# Patient Record
Sex: Female | Born: 1940 | Race: White | Hispanic: No | Smoking: Former smoker
Health system: Southern US, Community
[De-identification: ages and names within clinical notes are randomized; demographics above are authoritative.]

## PROBLEM LIST (undated history)

## (undated) DIAGNOSIS — R9389 Abnormal findings on diagnostic imaging of other specified body structures: Secondary | ICD-10-CM

## (undated) DIAGNOSIS — Z808 Family history of malignant neoplasm of other organs or systems: Secondary | ICD-10-CM

## (undated) DIAGNOSIS — E213 Hyperparathyroidism, unspecified: Secondary | ICD-10-CM

## (undated) DIAGNOSIS — E785 Hyperlipidemia, unspecified: Secondary | ICD-10-CM

## (undated) DIAGNOSIS — M1990 Unspecified osteoarthritis, unspecified site: Secondary | ICD-10-CM

## (undated) DIAGNOSIS — E119 Type 2 diabetes mellitus without complications: Secondary | ICD-10-CM

## (undated) DIAGNOSIS — Z800 Family history of malignant neoplasm of digestive organs: Secondary | ICD-10-CM

## (undated) DIAGNOSIS — I10 Essential (primary) hypertension: Secondary | ICD-10-CM

## (undated) HISTORY — DX: Family history of malignant neoplasm of other organs or systems: Z80.8

## (undated) HISTORY — PX: TUBAL LIGATION: SHX77

## (undated) HISTORY — PX: BREAST BIOPSY: SHX20

## (undated) HISTORY — DX: Family history of malignant neoplasm of digestive organs: Z80.0

## (undated) HISTORY — PX: MENISCUS REPAIR: SHX5179

## (undated) HISTORY — PX: ESOPHAGOGASTRODUODENOSCOPY (EGD) WITH PROPOFOL: SHX5813

## (undated) HISTORY — DX: Unspecified osteoarthritis, unspecified site: M19.90

---

## 1987-08-21 HISTORY — PX: BREAST BIOPSY: SHX20

## 1987-08-21 HISTORY — PX: BREAST EXCISIONAL BIOPSY: SUR124

## 2001-04-30 ENCOUNTER — Encounter: Payer: Self-pay | Admitting: Internal Medicine

## 2001-04-30 ENCOUNTER — Encounter: Admission: RE | Admit: 2001-04-30 | Discharge: 2001-04-30 | Payer: Self-pay | Admitting: Internal Medicine

## 2001-05-12 ENCOUNTER — Other Ambulatory Visit: Admission: RE | Admit: 2001-05-12 | Discharge: 2001-05-12 | Payer: Self-pay | Admitting: Internal Medicine

## 2001-06-05 ENCOUNTER — Ambulatory Visit (HOSPITAL_COMMUNITY): Admission: RE | Admit: 2001-06-05 | Discharge: 2001-06-05 | Payer: Self-pay | Admitting: Gastroenterology

## 2002-06-29 ENCOUNTER — Ambulatory Visit (HOSPITAL_COMMUNITY): Admission: RE | Admit: 2002-06-29 | Discharge: 2002-06-29 | Payer: Self-pay

## 2004-12-21 ENCOUNTER — Encounter: Admission: RE | Admit: 2004-12-21 | Discharge: 2004-12-21 | Payer: Self-pay | Admitting: Internal Medicine

## 2004-12-22 ENCOUNTER — Other Ambulatory Visit: Admission: RE | Admit: 2004-12-22 | Discharge: 2004-12-22 | Payer: Self-pay | Admitting: Internal Medicine

## 2005-02-06 ENCOUNTER — Encounter: Admission: RE | Admit: 2005-02-06 | Discharge: 2005-02-06 | Payer: Self-pay | Admitting: Otolaryngology

## 2005-05-30 ENCOUNTER — Emergency Department (HOSPITAL_COMMUNITY): Admission: EM | Admit: 2005-05-30 | Discharge: 2005-05-30 | Payer: Self-pay | Admitting: Emergency Medicine

## 2006-02-21 ENCOUNTER — Encounter: Admission: RE | Admit: 2006-02-21 | Discharge: 2006-02-21 | Payer: Self-pay | Admitting: Internal Medicine

## 2007-02-24 ENCOUNTER — Encounter: Admission: RE | Admit: 2007-02-24 | Discharge: 2007-02-24 | Payer: Self-pay | Admitting: Internal Medicine

## 2007-12-25 ENCOUNTER — Other Ambulatory Visit: Admission: RE | Admit: 2007-12-25 | Discharge: 2007-12-25 | Payer: Self-pay | Admitting: Obstetrics and Gynecology

## 2008-03-30 ENCOUNTER — Encounter: Admission: RE | Admit: 2008-03-30 | Discharge: 2008-03-30 | Payer: Self-pay | Admitting: Internal Medicine

## 2009-04-04 ENCOUNTER — Encounter: Admission: RE | Admit: 2009-04-04 | Discharge: 2009-04-04 | Payer: Self-pay | Admitting: Internal Medicine

## 2010-05-17 ENCOUNTER — Encounter: Admission: RE | Admit: 2010-05-17 | Discharge: 2010-05-17 | Payer: Self-pay | Admitting: Internal Medicine

## 2011-01-05 NOTE — Procedures (Signed)
[facility]. [facility]  Patient:    Jade Sanchez, Jade Sanchez Visit Number: [id number] MRN: [id number]          Service Type: Attending:  Verlin Grills, M.D. Dictated by:   Verlin Grills, M.D. Proc. Date: 06/05/01                             Procedure Report  DATE OF BIRTH:  1941-05-31  REFERRING PHYSICIAN:  PROCEDURE PERFORMED:  Colonoscopy.  ENDOSCOPIST:  Verlin Grills, M.D.  INDICATIONS FOR PROCEDURE:  The patient is a 70 year old female.  She last underwent a screening colonoscopy three years ago in [place], [place] and a colon polyp was removed.  She is due for a repeat surveillance colonoscopy with polypectomy to prevent colon cancer.  Her father developed colon cancer at age 11.  Her mother developed colon cancer when she was in her 79s.  PREMEDICATION:  Versed 10 mg, fentanyl 50 mcg.  ENDOSCOPE:  Olympus video colonoscope.  DESCRIPTION OF PROCEDURE:  After obtaining informed consent, the patient was placed in the left lateral decubitus position.  I administered intravenous fentanyl and intravenous Versed to achieve conscious sedation for the procedure.  The patients blood pressure, oxygen saturation and cardiac rhythm were monitored throughout the procedure and documented in the medical record.  Anal inspection was normal.  Digital rectal exam was normal.  The Olympus pediatric video colonoscope was then introduced into the rectum and slowly advanced to the cecum.  The procedure was started with the patient in the left lateral decubitus position; she was then rolled to the supine position and finally in the right lateral decubitus position.  External abdominal pressure was required in order to intubate the cecum.  A normal-appearing ileocecal valve was identified and the distal ileum was inspected.  Colonic preparation for the exam today was excellent.  Rectum:  Normal.  Sigmoid colon and descending colon:   Extensive left colonic diverticulosis.  Splenic flexure:  Normal.  Transverse colon:  Normal.  Hepatic flexure:  Normal.  Ascending colon:  Normal.  Cecum and ileocecal valve:  Normal.  Distal ileum:  Normal.  ASSESSMENT:  Extensive left colonic diverticulosis.  No endoscopic evidence for the presence of colorectal neoplasia.  RECOMMENDATIONS:  Repeat colonoscopy in approximately five years. Dictated by:   Verlin Grills, M.D. Attending:  Verlin Grills, M.D. DD:  06/05/01 TD:  06/05/01 Job: [id number]

## 2011-07-24 ENCOUNTER — Other Ambulatory Visit: Payer: Self-pay | Admitting: Family Medicine

## 2011-07-24 DIAGNOSIS — Z1231 Encounter for screening mammogram for malignant neoplasm of breast: Secondary | ICD-10-CM

## 2011-08-28 ENCOUNTER — Ambulatory Visit
Admission: RE | Admit: 2011-08-28 | Discharge: 2011-08-28 | Disposition: A | Discharge status: Home / Self Care | Payer: Medicare Other | Source: Ambulatory Visit | Attending: Family Medicine | Admitting: Family Medicine

## 2011-08-28 DIAGNOSIS — Z1231 Encounter for screening mammogram for malignant neoplasm of breast: Secondary | ICD-10-CM

## 2011-10-23 DIAGNOSIS — E119 Type 2 diabetes mellitus without complications: Secondary | ICD-10-CM | POA: Diagnosis not present

## 2011-10-23 DIAGNOSIS — I10 Essential (primary) hypertension: Secondary | ICD-10-CM | POA: Diagnosis not present

## 2011-12-11 DIAGNOSIS — Z800 Family history of malignant neoplasm of digestive organs: Secondary | ICD-10-CM | POA: Diagnosis not present

## 2011-12-11 DIAGNOSIS — Z86010 Personal history of colonic polyps: Secondary | ICD-10-CM | POA: Diagnosis not present

## 2011-12-11 DIAGNOSIS — Z09 Encounter for follow-up examination after completed treatment for conditions other than malignant neoplasm: Secondary | ICD-10-CM | POA: Diagnosis not present

## 2011-12-11 DIAGNOSIS — K5730 Diverticulosis of large intestine without perforation or abscess without bleeding: Secondary | ICD-10-CM | POA: Diagnosis not present

## 2012-01-21 DIAGNOSIS — D3130 Benign neoplasm of unspecified choroid: Secondary | ICD-10-CM | POA: Diagnosis not present

## 2012-01-21 DIAGNOSIS — H40019 Open angle with borderline findings, low risk, unspecified eye: Secondary | ICD-10-CM | POA: Diagnosis not present

## 2012-01-21 DIAGNOSIS — H35039 Hypertensive retinopathy, unspecified eye: Secondary | ICD-10-CM | POA: Diagnosis not present

## 2012-01-21 DIAGNOSIS — H2510 Age-related nuclear cataract, unspecified eye: Secondary | ICD-10-CM | POA: Diagnosis not present

## 2012-01-21 DIAGNOSIS — H43819 Vitreous degeneration, unspecified eye: Secondary | ICD-10-CM | POA: Diagnosis not present

## 2012-01-21 DIAGNOSIS — H35379 Puckering of macula, unspecified eye: Secondary | ICD-10-CM | POA: Diagnosis not present

## 2012-06-04 DIAGNOSIS — E119 Type 2 diabetes mellitus without complications: Secondary | ICD-10-CM | POA: Diagnosis not present

## 2012-06-04 DIAGNOSIS — Z23 Encounter for immunization: Secondary | ICD-10-CM | POA: Diagnosis not present

## 2012-06-04 DIAGNOSIS — E785 Hyperlipidemia, unspecified: Secondary | ICD-10-CM | POA: Diagnosis not present

## 2012-06-04 DIAGNOSIS — Z1331 Encounter for screening for depression: Secondary | ICD-10-CM | POA: Diagnosis not present

## 2012-06-04 DIAGNOSIS — I10 Essential (primary) hypertension: Secondary | ICD-10-CM | POA: Diagnosis not present

## 2012-06-12 DIAGNOSIS — E559 Vitamin D deficiency, unspecified: Secondary | ICD-10-CM | POA: Diagnosis not present

## 2012-06-24 DIAGNOSIS — D485 Neoplasm of uncertain behavior of skin: Secondary | ICD-10-CM | POA: Diagnosis not present

## 2012-07-29 DIAGNOSIS — D485 Neoplasm of uncertain behavior of skin: Secondary | ICD-10-CM | POA: Diagnosis not present

## 2012-07-29 DIAGNOSIS — D235 Other benign neoplasm of skin of trunk: Secondary | ICD-10-CM | POA: Diagnosis not present

## 2012-08-07 ENCOUNTER — Other Ambulatory Visit: Payer: Self-pay | Admitting: Internal Medicine

## 2012-08-07 DIAGNOSIS — Z1231 Encounter for screening mammogram for malignant neoplasm of breast: Secondary | ICD-10-CM

## 2012-09-09 ENCOUNTER — Ambulatory Visit
Admission: RE | Admit: 2012-09-09 | Discharge: 2012-09-09 | Disposition: A | Discharge status: Home / Self Care | Payer: Medicare Other | Source: Ambulatory Visit | Attending: Internal Medicine | Admitting: Internal Medicine

## 2012-09-09 DIAGNOSIS — Z1231 Encounter for screening mammogram for malignant neoplasm of breast: Secondary | ICD-10-CM

## 2013-01-21 DIAGNOSIS — E119 Type 2 diabetes mellitus without complications: Secondary | ICD-10-CM | POA: Diagnosis not present

## 2013-01-21 DIAGNOSIS — H40019 Open angle with borderline findings, low risk, unspecified eye: Secondary | ICD-10-CM | POA: Diagnosis not present

## 2013-01-21 DIAGNOSIS — H43819 Vitreous degeneration, unspecified eye: Secondary | ICD-10-CM | POA: Diagnosis not present

## 2013-01-21 DIAGNOSIS — H2510 Age-related nuclear cataract, unspecified eye: Secondary | ICD-10-CM | POA: Diagnosis not present

## 2013-01-21 DIAGNOSIS — H35039 Hypertensive retinopathy, unspecified eye: Secondary | ICD-10-CM | POA: Diagnosis not present

## 2013-01-21 DIAGNOSIS — H35379 Puckering of macula, unspecified eye: Secondary | ICD-10-CM | POA: Diagnosis not present

## 2013-01-21 DIAGNOSIS — D3130 Benign neoplasm of unspecified choroid: Secondary | ICD-10-CM | POA: Diagnosis not present

## 2013-02-11 DIAGNOSIS — H2510 Age-related nuclear cataract, unspecified eye: Secondary | ICD-10-CM | POA: Diagnosis not present

## 2013-03-03 DIAGNOSIS — H2510 Age-related nuclear cataract, unspecified eye: Secondary | ICD-10-CM | POA: Diagnosis not present

## 2013-03-03 DIAGNOSIS — H269 Unspecified cataract: Secondary | ICD-10-CM | POA: Diagnosis not present

## 2013-04-15 ENCOUNTER — Encounter (INDEPENDENT_AMBULATORY_CARE_PROVIDER_SITE_OTHER): Payer: Medicare Other | Admitting: Ophthalmology

## 2013-04-15 DIAGNOSIS — H35379 Puckering of macula, unspecified eye: Secondary | ICD-10-CM

## 2013-04-15 DIAGNOSIS — I10 Essential (primary) hypertension: Secondary | ICD-10-CM

## 2013-04-15 DIAGNOSIS — H43819 Vitreous degeneration, unspecified eye: Secondary | ICD-10-CM

## 2013-04-15 DIAGNOSIS — H35039 Hypertensive retinopathy, unspecified eye: Secondary | ICD-10-CM

## 2013-04-15 DIAGNOSIS — H2510 Age-related nuclear cataract, unspecified eye: Secondary | ICD-10-CM

## 2013-04-15 DIAGNOSIS — E11319 Type 2 diabetes mellitus with unspecified diabetic retinopathy without macular edema: Secondary | ICD-10-CM | POA: Diagnosis not present

## 2013-04-15 DIAGNOSIS — E1139 Type 2 diabetes mellitus with other diabetic ophthalmic complication: Secondary | ICD-10-CM

## 2013-06-29 DIAGNOSIS — H40019 Open angle with borderline findings, low risk, unspecified eye: Secondary | ICD-10-CM | POA: Diagnosis not present

## 2013-07-30 DIAGNOSIS — D485 Neoplasm of uncertain behavior of skin: Secondary | ICD-10-CM | POA: Diagnosis not present

## 2013-07-30 DIAGNOSIS — D1801 Hemangioma of skin and subcutaneous tissue: Secondary | ICD-10-CM | POA: Diagnosis not present

## 2013-07-30 DIAGNOSIS — D235 Other benign neoplasm of skin of trunk: Secondary | ICD-10-CM | POA: Diagnosis not present

## 2013-07-30 DIAGNOSIS — L819 Disorder of pigmentation, unspecified: Secondary | ICD-10-CM | POA: Diagnosis not present

## 2013-07-30 DIAGNOSIS — L821 Other seborrheic keratosis: Secondary | ICD-10-CM | POA: Diagnosis not present

## 2013-08-27 DIAGNOSIS — E119 Type 2 diabetes mellitus without complications: Secondary | ICD-10-CM | POA: Diagnosis not present

## 2013-08-27 DIAGNOSIS — E11319 Type 2 diabetes mellitus with unspecified diabetic retinopathy without macular edema: Secondary | ICD-10-CM | POA: Diagnosis not present

## 2013-08-27 DIAGNOSIS — H2510 Age-related nuclear cataract, unspecified eye: Secondary | ICD-10-CM | POA: Diagnosis not present

## 2013-08-27 DIAGNOSIS — H40019 Open angle with borderline findings, low risk, unspecified eye: Secondary | ICD-10-CM | POA: Diagnosis not present

## 2013-09-14 DIAGNOSIS — H26499 Other secondary cataract, unspecified eye: Secondary | ICD-10-CM | POA: Diagnosis not present

## 2013-10-19 ENCOUNTER — Ambulatory Visit (INDEPENDENT_AMBULATORY_CARE_PROVIDER_SITE_OTHER): Payer: Medicare Other | Admitting: Ophthalmology

## 2013-10-19 DIAGNOSIS — H35379 Puckering of macula, unspecified eye: Secondary | ICD-10-CM | POA: Diagnosis not present

## 2013-10-19 DIAGNOSIS — I10 Essential (primary) hypertension: Secondary | ICD-10-CM | POA: Diagnosis not present

## 2013-10-19 DIAGNOSIS — E1139 Type 2 diabetes mellitus with other diabetic ophthalmic complication: Secondary | ICD-10-CM

## 2013-10-19 DIAGNOSIS — E1165 Type 2 diabetes mellitus with hyperglycemia: Secondary | ICD-10-CM | POA: Diagnosis not present

## 2013-10-19 DIAGNOSIS — H2510 Age-related nuclear cataract, unspecified eye: Secondary | ICD-10-CM

## 2013-10-19 DIAGNOSIS — E11319 Type 2 diabetes mellitus with unspecified diabetic retinopathy without macular edema: Secondary | ICD-10-CM

## 2013-10-19 DIAGNOSIS — H43819 Vitreous degeneration, unspecified eye: Secondary | ICD-10-CM

## 2013-10-19 DIAGNOSIS — H35039 Hypertensive retinopathy, unspecified eye: Secondary | ICD-10-CM

## 2014-01-22 ENCOUNTER — Other Ambulatory Visit: Payer: Self-pay

## 2014-01-22 DIAGNOSIS — Z1231 Encounter for screening mammogram for malignant neoplasm of breast: Secondary | ICD-10-CM

## 2014-02-02 ENCOUNTER — Encounter (INDEPENDENT_AMBULATORY_CARE_PROVIDER_SITE_OTHER): Payer: Self-pay

## 2014-02-02 ENCOUNTER — Ambulatory Visit
Admission: RE | Admit: 2014-02-02 | Discharge: 2014-02-02 | Disposition: A | Discharge status: Home / Self Care | Payer: Medicare Other | Source: Ambulatory Visit

## 2014-02-02 DIAGNOSIS — Z1231 Encounter for screening mammogram for malignant neoplasm of breast: Secondary | ICD-10-CM

## 2014-02-03 ENCOUNTER — Other Ambulatory Visit: Payer: Self-pay | Admitting: Internal Medicine

## 2014-02-03 DIAGNOSIS — R928 Other abnormal and inconclusive findings on diagnostic imaging of breast: Secondary | ICD-10-CM

## 2014-02-09 ENCOUNTER — Ambulatory Visit
Admission: RE | Admit: 2014-02-09 | Discharge: 2014-02-09 | Disposition: A | Discharge status: Home / Self Care | Payer: Medicare Other | Source: Ambulatory Visit | Attending: Internal Medicine | Admitting: Internal Medicine

## 2014-02-09 ENCOUNTER — Other Ambulatory Visit: Payer: Self-pay | Admitting: Internal Medicine

## 2014-02-09 DIAGNOSIS — N6489 Other specified disorders of breast: Secondary | ICD-10-CM | POA: Diagnosis not present

## 2014-02-09 DIAGNOSIS — N6310 Unspecified lump in the right breast, unspecified quadrant: Secondary | ICD-10-CM

## 2014-02-09 DIAGNOSIS — R928 Other abnormal and inconclusive findings on diagnostic imaging of breast: Secondary | ICD-10-CM

## 2014-02-09 DIAGNOSIS — N6320 Unspecified lump in the left breast, unspecified quadrant: Secondary | ICD-10-CM

## 2014-02-15 ENCOUNTER — Other Ambulatory Visit: Payer: Self-pay | Admitting: Internal Medicine

## 2014-02-15 DIAGNOSIS — N6310 Unspecified lump in the right breast, unspecified quadrant: Secondary | ICD-10-CM

## 2014-02-16 ENCOUNTER — Ambulatory Visit
Admission: RE | Admit: 2014-02-16 | Discharge: 2014-02-16 | Disposition: A | Discharge status: Home / Self Care | Payer: Medicare Other | Source: Ambulatory Visit | Attending: Internal Medicine | Admitting: Internal Medicine

## 2014-02-16 DIAGNOSIS — N6310 Unspecified lump in the right breast, unspecified quadrant: Secondary | ICD-10-CM

## 2014-02-16 DIAGNOSIS — D360 Benign neoplasm of lymph nodes: Secondary | ICD-10-CM | POA: Diagnosis not present

## 2014-02-16 DIAGNOSIS — N6320 Unspecified lump in the left breast, unspecified quadrant: Secondary | ICD-10-CM

## 2014-02-16 DIAGNOSIS — N63 Unspecified lump in unspecified breast: Secondary | ICD-10-CM | POA: Diagnosis not present

## 2014-02-18 DIAGNOSIS — E1129 Type 2 diabetes mellitus with other diabetic kidney complication: Secondary | ICD-10-CM | POA: Diagnosis not present

## 2014-02-18 DIAGNOSIS — E11319 Type 2 diabetes mellitus with unspecified diabetic retinopathy without macular edema: Secondary | ICD-10-CM | POA: Diagnosis not present

## 2014-02-18 DIAGNOSIS — E785 Hyperlipidemia, unspecified: Secondary | ICD-10-CM | POA: Diagnosis not present

## 2014-02-18 DIAGNOSIS — Z0000 Encounter for general adult medical examination without abnormal findings: Secondary | ICD-10-CM | POA: Diagnosis not present

## 2014-02-18 DIAGNOSIS — Z23 Encounter for immunization: Secondary | ICD-10-CM | POA: Diagnosis not present

## 2014-02-18 DIAGNOSIS — N183 Chronic kidney disease, stage 3 unspecified: Secondary | ICD-10-CM | POA: Diagnosis not present

## 2014-02-18 DIAGNOSIS — E1139 Type 2 diabetes mellitus with other diabetic ophthalmic complication: Secondary | ICD-10-CM | POA: Diagnosis not present

## 2014-02-18 DIAGNOSIS — E210 Primary hyperparathyroidism: Secondary | ICD-10-CM | POA: Diagnosis not present

## 2014-02-18 DIAGNOSIS — Z1331 Encounter for screening for depression: Secondary | ICD-10-CM | POA: Diagnosis not present

## 2014-02-18 DIAGNOSIS — R599 Enlarged lymph nodes, unspecified: Secondary | ICD-10-CM | POA: Diagnosis not present

## 2014-02-18 DIAGNOSIS — I10 Essential (primary) hypertension: Secondary | ICD-10-CM | POA: Diagnosis not present

## 2014-03-15 DIAGNOSIS — H40019 Open angle with borderline findings, low risk, unspecified eye: Secondary | ICD-10-CM | POA: Diagnosis not present

## 2014-03-15 DIAGNOSIS — H2510 Age-related nuclear cataract, unspecified eye: Secondary | ICD-10-CM | POA: Diagnosis not present

## 2014-03-15 DIAGNOSIS — H25019 Cortical age-related cataract, unspecified eye: Secondary | ICD-10-CM | POA: Diagnosis not present

## 2014-03-15 DIAGNOSIS — H35039 Hypertensive retinopathy, unspecified eye: Secondary | ICD-10-CM | POA: Diagnosis not present

## 2014-03-25 DIAGNOSIS — E213 Hyperparathyroidism, unspecified: Secondary | ICD-10-CM | POA: Diagnosis not present

## 2014-03-25 DIAGNOSIS — M810 Age-related osteoporosis without current pathological fracture: Secondary | ICD-10-CM | POA: Diagnosis not present

## 2014-04-13 DIAGNOSIS — H2510 Age-related nuclear cataract, unspecified eye: Secondary | ICD-10-CM | POA: Diagnosis not present

## 2014-04-22 ENCOUNTER — Ambulatory Visit (INDEPENDENT_AMBULATORY_CARE_PROVIDER_SITE_OTHER): Payer: Medicare Other | Admitting: Ophthalmology

## 2014-04-22 DIAGNOSIS — I10 Essential (primary) hypertension: Secondary | ICD-10-CM

## 2014-04-22 DIAGNOSIS — E1165 Type 2 diabetes mellitus with hyperglycemia: Secondary | ICD-10-CM | POA: Diagnosis not present

## 2014-04-22 DIAGNOSIS — E1139 Type 2 diabetes mellitus with other diabetic ophthalmic complication: Secondary | ICD-10-CM | POA: Diagnosis not present

## 2014-04-22 DIAGNOSIS — D3130 Benign neoplasm of unspecified choroid: Secondary | ICD-10-CM

## 2014-04-22 DIAGNOSIS — H35379 Puckering of macula, unspecified eye: Secondary | ICD-10-CM

## 2014-04-22 DIAGNOSIS — E11319 Type 2 diabetes mellitus with unspecified diabetic retinopathy without macular edema: Secondary | ICD-10-CM | POA: Diagnosis not present

## 2014-04-22 DIAGNOSIS — H35039 Hypertensive retinopathy, unspecified eye: Secondary | ICD-10-CM

## 2014-05-06 ENCOUNTER — Other Ambulatory Visit: Payer: Self-pay | Admitting: Internal Medicine

## 2014-05-06 DIAGNOSIS — N63 Unspecified lump in unspecified breast: Secondary | ICD-10-CM

## 2014-05-18 ENCOUNTER — Ambulatory Visit
Admission: RE | Admit: 2014-05-18 | Discharge: 2014-05-18 | Disposition: A | Discharge status: Home / Self Care | Payer: Medicare Other | Source: Ambulatory Visit | Attending: Internal Medicine | Admitting: Internal Medicine

## 2014-05-18 DIAGNOSIS — N63 Unspecified lump in unspecified breast: Secondary | ICD-10-CM

## 2014-05-18 DIAGNOSIS — N6489 Other specified disorders of breast: Secondary | ICD-10-CM | POA: Diagnosis not present

## 2014-05-26 DIAGNOSIS — E210 Primary hyperparathyroidism: Secondary | ICD-10-CM | POA: Diagnosis not present

## 2014-05-26 DIAGNOSIS — N183 Chronic kidney disease, stage 3 (moderate): Secondary | ICD-10-CM | POA: Diagnosis not present

## 2014-05-26 DIAGNOSIS — I10 Essential (primary) hypertension: Secondary | ICD-10-CM | POA: Diagnosis not present

## 2014-05-26 DIAGNOSIS — Z23 Encounter for immunization: Secondary | ICD-10-CM | POA: Diagnosis not present

## 2014-05-26 DIAGNOSIS — E11319 Type 2 diabetes mellitus with unspecified diabetic retinopathy without macular edema: Secondary | ICD-10-CM | POA: Diagnosis not present

## 2014-05-26 DIAGNOSIS — E1122 Type 2 diabetes mellitus with diabetic chronic kidney disease: Secondary | ICD-10-CM | POA: Diagnosis not present

## 2014-05-26 DIAGNOSIS — M810 Age-related osteoporosis without current pathological fracture: Secondary | ICD-10-CM | POA: Diagnosis not present

## 2014-05-26 DIAGNOSIS — E1139 Type 2 diabetes mellitus with other diabetic ophthalmic complication: Secondary | ICD-10-CM | POA: Diagnosis not present

## 2014-05-26 DIAGNOSIS — L509 Urticaria, unspecified: Secondary | ICD-10-CM | POA: Diagnosis not present

## 2014-07-30 DIAGNOSIS — L814 Other melanin hyperpigmentation: Secondary | ICD-10-CM | POA: Diagnosis not present

## 2014-07-30 DIAGNOSIS — D225 Melanocytic nevi of trunk: Secondary | ICD-10-CM | POA: Diagnosis not present

## 2014-07-30 DIAGNOSIS — L821 Other seborrheic keratosis: Secondary | ICD-10-CM | POA: Diagnosis not present

## 2014-07-30 DIAGNOSIS — D485 Neoplasm of uncertain behavior of skin: Secondary | ICD-10-CM | POA: Diagnosis not present

## 2014-08-10 DIAGNOSIS — H40013 Open angle with borderline findings, low risk, bilateral: Secondary | ICD-10-CM | POA: Diagnosis not present

## 2014-09-09 DIAGNOSIS — E210 Primary hyperparathyroidism: Secondary | ICD-10-CM | POA: Diagnosis not present

## 2014-09-09 DIAGNOSIS — M810 Age-related osteoporosis without current pathological fracture: Secondary | ICD-10-CM | POA: Diagnosis not present

## 2014-09-28 DIAGNOSIS — M810 Age-related osteoporosis without current pathological fracture: Secondary | ICD-10-CM | POA: Diagnosis not present

## 2014-09-28 DIAGNOSIS — E210 Primary hyperparathyroidism: Secondary | ICD-10-CM | POA: Diagnosis not present

## 2014-10-06 DIAGNOSIS — M810 Age-related osteoporosis without current pathological fracture: Secondary | ICD-10-CM | POA: Diagnosis not present

## 2014-10-06 DIAGNOSIS — E1122 Type 2 diabetes mellitus with diabetic chronic kidney disease: Secondary | ICD-10-CM | POA: Diagnosis not present

## 2014-10-06 DIAGNOSIS — I10 Essential (primary) hypertension: Secondary | ICD-10-CM | POA: Diagnosis not present

## 2014-11-04 DIAGNOSIS — Z961 Presence of intraocular lens: Secondary | ICD-10-CM | POA: Diagnosis not present

## 2014-11-04 DIAGNOSIS — H26491 Other secondary cataract, right eye: Secondary | ICD-10-CM | POA: Diagnosis not present

## 2015-01-04 DIAGNOSIS — E1122 Type 2 diabetes mellitus with diabetic chronic kidney disease: Secondary | ICD-10-CM | POA: Diagnosis not present

## 2015-01-04 DIAGNOSIS — E11319 Type 2 diabetes mellitus with unspecified diabetic retinopathy without macular edema: Secondary | ICD-10-CM | POA: Diagnosis not present

## 2015-01-04 DIAGNOSIS — E1139 Type 2 diabetes mellitus with other diabetic ophthalmic complication: Secondary | ICD-10-CM | POA: Diagnosis not present

## 2015-01-04 DIAGNOSIS — E780 Pure hypercholesterolemia: Secondary | ICD-10-CM | POA: Diagnosis not present

## 2015-01-04 DIAGNOSIS — N183 Chronic kidney disease, stage 3 (moderate): Secondary | ICD-10-CM | POA: Diagnosis not present

## 2015-01-04 DIAGNOSIS — I129 Hypertensive chronic kidney disease with stage 1 through stage 4 chronic kidney disease, or unspecified chronic kidney disease: Secondary | ICD-10-CM | POA: Diagnosis not present

## 2015-01-04 DIAGNOSIS — M810 Age-related osteoporosis without current pathological fracture: Secondary | ICD-10-CM | POA: Diagnosis not present

## 2015-01-04 DIAGNOSIS — E210 Primary hyperparathyroidism: Secondary | ICD-10-CM | POA: Diagnosis not present

## 2015-01-04 DIAGNOSIS — Z6841 Body Mass Index (BMI) 40.0 and over, adult: Secondary | ICD-10-CM | POA: Diagnosis not present

## 2015-01-04 DIAGNOSIS — E1165 Type 2 diabetes mellitus with hyperglycemia: Secondary | ICD-10-CM | POA: Diagnosis not present

## 2015-02-02 ENCOUNTER — Other Ambulatory Visit: Payer: Self-pay | Admitting: Internal Medicine

## 2015-02-02 DIAGNOSIS — Z1231 Encounter for screening mammogram for malignant neoplasm of breast: Secondary | ICD-10-CM

## 2015-03-22 ENCOUNTER — Ambulatory Visit
Admission: RE | Admit: 2015-03-22 | Discharge: 2015-03-22 | Disposition: A | Discharge status: Home / Self Care | Payer: Medicare Other | Source: Ambulatory Visit | Attending: Internal Medicine | Admitting: Internal Medicine

## 2015-03-22 DIAGNOSIS — Z1231 Encounter for screening mammogram for malignant neoplasm of breast: Secondary | ICD-10-CM | POA: Diagnosis not present

## 2015-04-12 DIAGNOSIS — I10 Essential (primary) hypertension: Secondary | ICD-10-CM | POA: Diagnosis not present

## 2015-04-12 DIAGNOSIS — M810 Age-related osteoporosis without current pathological fracture: Secondary | ICD-10-CM | POA: Diagnosis not present

## 2015-04-28 ENCOUNTER — Ambulatory Visit (INDEPENDENT_AMBULATORY_CARE_PROVIDER_SITE_OTHER): Payer: Medicare Other | Admitting: Ophthalmology

## 2015-04-28 DIAGNOSIS — E11319 Type 2 diabetes mellitus with unspecified diabetic retinopathy without macular edema: Secondary | ICD-10-CM | POA: Diagnosis not present

## 2015-04-28 DIAGNOSIS — H35033 Hypertensive retinopathy, bilateral: Secondary | ICD-10-CM | POA: Diagnosis not present

## 2015-04-28 DIAGNOSIS — H43813 Vitreous degeneration, bilateral: Secondary | ICD-10-CM

## 2015-04-28 DIAGNOSIS — D3131 Benign neoplasm of right choroid: Secondary | ICD-10-CM | POA: Diagnosis not present

## 2015-04-28 DIAGNOSIS — E11329 Type 2 diabetes mellitus with mild nonproliferative diabetic retinopathy without macular edema: Secondary | ICD-10-CM | POA: Diagnosis not present

## 2015-04-28 DIAGNOSIS — I10 Essential (primary) hypertension: Secondary | ICD-10-CM

## 2015-07-08 DIAGNOSIS — Z6838 Body mass index (BMI) 38.0-38.9, adult: Secondary | ICD-10-CM | POA: Diagnosis not present

## 2015-07-08 DIAGNOSIS — E11319 Type 2 diabetes mellitus with unspecified diabetic retinopathy without macular edema: Secondary | ICD-10-CM | POA: Diagnosis not present

## 2015-07-08 DIAGNOSIS — E1139 Type 2 diabetes mellitus with other diabetic ophthalmic complication: Secondary | ICD-10-CM | POA: Diagnosis not present

## 2015-07-08 DIAGNOSIS — N183 Chronic kidney disease, stage 3 (moderate): Secondary | ICD-10-CM | POA: Diagnosis not present

## 2015-07-08 DIAGNOSIS — I129 Hypertensive chronic kidney disease with stage 1 through stage 4 chronic kidney disease, or unspecified chronic kidney disease: Secondary | ICD-10-CM | POA: Diagnosis not present

## 2015-07-08 DIAGNOSIS — E1122 Type 2 diabetes mellitus with diabetic chronic kidney disease: Secondary | ICD-10-CM | POA: Diagnosis not present

## 2015-07-08 DIAGNOSIS — Z23 Encounter for immunization: Secondary | ICD-10-CM | POA: Diagnosis not present

## 2015-08-02 DIAGNOSIS — H40013 Open angle with borderline findings, low risk, bilateral: Secondary | ICD-10-CM | POA: Diagnosis not present

## 2015-08-23 DIAGNOSIS — D1801 Hemangioma of skin and subcutaneous tissue: Secondary | ICD-10-CM | POA: Diagnosis not present

## 2015-08-23 DIAGNOSIS — L820 Inflamed seborrheic keratosis: Secondary | ICD-10-CM | POA: Diagnosis not present

## 2015-08-23 DIAGNOSIS — L812 Freckles: Secondary | ICD-10-CM | POA: Diagnosis not present

## 2015-08-23 DIAGNOSIS — D225 Melanocytic nevi of trunk: Secondary | ICD-10-CM | POA: Diagnosis not present

## 2015-10-13 DIAGNOSIS — I10 Essential (primary) hypertension: Secondary | ICD-10-CM | POA: Diagnosis not present

## 2015-10-13 DIAGNOSIS — M810 Age-related osteoporosis without current pathological fracture: Secondary | ICD-10-CM | POA: Diagnosis not present

## 2015-10-14 DIAGNOSIS — L723 Sebaceous cyst: Secondary | ICD-10-CM | POA: Diagnosis not present

## 2015-10-14 DIAGNOSIS — H9201 Otalgia, right ear: Secondary | ICD-10-CM | POA: Diagnosis not present

## 2015-11-08 DIAGNOSIS — H35033 Hypertensive retinopathy, bilateral: Secondary | ICD-10-CM | POA: Diagnosis not present

## 2015-11-08 DIAGNOSIS — E119 Type 2 diabetes mellitus without complications: Secondary | ICD-10-CM | POA: Diagnosis not present

## 2015-11-08 DIAGNOSIS — Z961 Presence of intraocular lens: Secondary | ICD-10-CM | POA: Diagnosis not present

## 2015-11-08 DIAGNOSIS — H40013 Open angle with borderline findings, low risk, bilateral: Secondary | ICD-10-CM | POA: Diagnosis not present

## 2016-02-09 DIAGNOSIS — E7800 Pure hypercholesterolemia, unspecified: Secondary | ICD-10-CM | POA: Diagnosis not present

## 2016-02-09 DIAGNOSIS — Z6839 Body mass index (BMI) 39.0-39.9, adult: Secondary | ICD-10-CM | POA: Diagnosis not present

## 2016-02-09 DIAGNOSIS — E1122 Type 2 diabetes mellitus with diabetic chronic kidney disease: Secondary | ICD-10-CM | POA: Diagnosis not present

## 2016-02-09 DIAGNOSIS — E210 Primary hyperparathyroidism: Secondary | ICD-10-CM | POA: Diagnosis not present

## 2016-02-09 DIAGNOSIS — N183 Chronic kidney disease, stage 3 (moderate): Secondary | ICD-10-CM | POA: Diagnosis not present

## 2016-02-09 DIAGNOSIS — I129 Hypertensive chronic kidney disease with stage 1 through stage 4 chronic kidney disease, or unspecified chronic kidney disease: Secondary | ICD-10-CM | POA: Diagnosis not present

## 2016-04-02 ENCOUNTER — Other Ambulatory Visit: Payer: Self-pay | Admitting: Internal Medicine

## 2016-04-02 DIAGNOSIS — Z1231 Encounter for screening mammogram for malignant neoplasm of breast: Secondary | ICD-10-CM

## 2016-04-11 ENCOUNTER — Ambulatory Visit
Admission: RE | Admit: 2016-04-11 | Discharge: 2016-04-11 | Disposition: A | Discharge status: Home / Self Care | Payer: Medicare Other | Source: Ambulatory Visit | Attending: Internal Medicine | Admitting: Internal Medicine

## 2016-04-11 DIAGNOSIS — Z1231 Encounter for screening mammogram for malignant neoplasm of breast: Secondary | ICD-10-CM | POA: Diagnosis not present

## 2016-04-11 DIAGNOSIS — M810 Age-related osteoporosis without current pathological fracture: Secondary | ICD-10-CM | POA: Diagnosis not present

## 2016-04-27 DIAGNOSIS — M810 Age-related osteoporosis without current pathological fracture: Secondary | ICD-10-CM | POA: Diagnosis not present

## 2016-04-27 DIAGNOSIS — I10 Essential (primary) hypertension: Secondary | ICD-10-CM | POA: Diagnosis not present

## 2016-04-30 ENCOUNTER — Ambulatory Visit (INDEPENDENT_AMBULATORY_CARE_PROVIDER_SITE_OTHER): Payer: Medicare Other | Admitting: Ophthalmology

## 2016-04-30 DIAGNOSIS — I10 Essential (primary) hypertension: Secondary | ICD-10-CM

## 2016-04-30 DIAGNOSIS — H35033 Hypertensive retinopathy, bilateral: Secondary | ICD-10-CM | POA: Diagnosis not present

## 2016-04-30 DIAGNOSIS — E113212 Type 2 diabetes mellitus with mild nonproliferative diabetic retinopathy with macular edema, left eye: Secondary | ICD-10-CM | POA: Diagnosis not present

## 2016-04-30 DIAGNOSIS — D3131 Benign neoplasm of right choroid: Secondary | ICD-10-CM | POA: Diagnosis not present

## 2016-04-30 DIAGNOSIS — H43813 Vitreous degeneration, bilateral: Secondary | ICD-10-CM | POA: Diagnosis not present

## 2016-04-30 DIAGNOSIS — E113291 Type 2 diabetes mellitus with mild nonproliferative diabetic retinopathy without macular edema, right eye: Secondary | ICD-10-CM | POA: Diagnosis not present

## 2016-04-30 DIAGNOSIS — E11311 Type 2 diabetes mellitus with unspecified diabetic retinopathy with macular edema: Secondary | ICD-10-CM | POA: Diagnosis not present

## 2016-06-06 DIAGNOSIS — Z23 Encounter for immunization: Secondary | ICD-10-CM | POA: Diagnosis not present

## 2016-06-28 DIAGNOSIS — H40013 Open angle with borderline findings, low risk, bilateral: Secondary | ICD-10-CM | POA: Diagnosis not present

## 2016-11-27 DIAGNOSIS — N183 Chronic kidney disease, stage 3 (moderate): Secondary | ICD-10-CM | POA: Diagnosis not present

## 2016-11-27 DIAGNOSIS — Z0000 Encounter for general adult medical examination without abnormal findings: Secondary | ICD-10-CM | POA: Diagnosis not present

## 2016-11-27 DIAGNOSIS — Z1389 Encounter for screening for other disorder: Secondary | ICD-10-CM | POA: Diagnosis not present

## 2016-11-27 DIAGNOSIS — J301 Allergic rhinitis due to pollen: Secondary | ICD-10-CM | POA: Diagnosis not present

## 2016-11-27 DIAGNOSIS — E210 Primary hyperparathyroidism: Secondary | ICD-10-CM | POA: Diagnosis not present

## 2016-11-27 DIAGNOSIS — Z800 Family history of malignant neoplasm of digestive organs: Secondary | ICD-10-CM | POA: Diagnosis not present

## 2016-11-27 DIAGNOSIS — Z1211 Encounter for screening for malignant neoplasm of colon: Secondary | ICD-10-CM | POA: Diagnosis not present

## 2016-11-27 DIAGNOSIS — E7800 Pure hypercholesterolemia, unspecified: Secondary | ICD-10-CM | POA: Diagnosis not present

## 2016-11-27 DIAGNOSIS — Z7189 Other specified counseling: Secondary | ICD-10-CM | POA: Diagnosis not present

## 2016-11-27 DIAGNOSIS — M810 Age-related osteoporosis without current pathological fracture: Secondary | ICD-10-CM | POA: Diagnosis not present

## 2016-11-27 DIAGNOSIS — E1122 Type 2 diabetes mellitus with diabetic chronic kidney disease: Secondary | ICD-10-CM | POA: Diagnosis not present

## 2016-11-27 DIAGNOSIS — I129 Hypertensive chronic kidney disease with stage 1 through stage 4 chronic kidney disease, or unspecified chronic kidney disease: Secondary | ICD-10-CM | POA: Diagnosis not present

## 2016-12-07 DIAGNOSIS — H40013 Open angle with borderline findings, low risk, bilateral: Secondary | ICD-10-CM | POA: Diagnosis not present

## 2016-12-07 DIAGNOSIS — E119 Type 2 diabetes mellitus without complications: Secondary | ICD-10-CM | POA: Diagnosis not present

## 2016-12-07 DIAGNOSIS — Z961 Presence of intraocular lens: Secondary | ICD-10-CM | POA: Diagnosis not present

## 2016-12-07 DIAGNOSIS — H35033 Hypertensive retinopathy, bilateral: Secondary | ICD-10-CM | POA: Diagnosis not present

## 2016-12-28 DIAGNOSIS — I129 Hypertensive chronic kidney disease with stage 1 through stage 4 chronic kidney disease, or unspecified chronic kidney disease: Secondary | ICD-10-CM | POA: Diagnosis not present

## 2016-12-28 DIAGNOSIS — Z79899 Other long term (current) drug therapy: Secondary | ICD-10-CM | POA: Diagnosis not present

## 2016-12-28 DIAGNOSIS — E210 Primary hyperparathyroidism: Secondary | ICD-10-CM | POA: Diagnosis not present

## 2017-01-25 DIAGNOSIS — Z5181 Encounter for therapeutic drug level monitoring: Secondary | ICD-10-CM | POA: Diagnosis not present

## 2017-03-25 ENCOUNTER — Other Ambulatory Visit: Payer: Self-pay | Admitting: Internal Medicine

## 2017-03-25 DIAGNOSIS — Z1231 Encounter for screening mammogram for malignant neoplasm of breast: Secondary | ICD-10-CM

## 2017-04-15 ENCOUNTER — Ambulatory Visit
Admission: RE | Admit: 2017-04-15 | Discharge: 2017-04-15 | Disposition: A | Discharge status: Home / Self Care | Payer: Medicare Other | Source: Ambulatory Visit | Attending: Internal Medicine | Admitting: Internal Medicine

## 2017-04-15 DIAGNOSIS — Z1231 Encounter for screening mammogram for malignant neoplasm of breast: Secondary | ICD-10-CM | POA: Diagnosis not present

## 2017-04-30 ENCOUNTER — Ambulatory Visit (INDEPENDENT_AMBULATORY_CARE_PROVIDER_SITE_OTHER): Payer: Medicare Other | Admitting: Ophthalmology

## 2017-05-01 ENCOUNTER — Ambulatory Visit
Admission: RE | Admit: 2017-05-01 | Discharge: 2017-05-01 | Disposition: A | Discharge status: Home / Self Care | Payer: Medicare Other | Source: Ambulatory Visit | Attending: Internal Medicine | Admitting: Internal Medicine

## 2017-05-01 ENCOUNTER — Other Ambulatory Visit: Payer: Self-pay | Admitting: Internal Medicine

## 2017-05-01 DIAGNOSIS — M79672 Pain in left foot: Secondary | ICD-10-CM | POA: Diagnosis not present

## 2017-05-01 DIAGNOSIS — E119 Type 2 diabetes mellitus without complications: Secondary | ICD-10-CM | POA: Diagnosis not present

## 2017-05-01 DIAGNOSIS — E210 Primary hyperparathyroidism: Secondary | ICD-10-CM | POA: Diagnosis not present

## 2017-05-01 DIAGNOSIS — M810 Age-related osteoporosis without current pathological fracture: Secondary | ICD-10-CM | POA: Diagnosis not present

## 2017-05-01 DIAGNOSIS — I10 Essential (primary) hypertension: Secondary | ICD-10-CM | POA: Diagnosis not present

## 2017-05-06 ENCOUNTER — Encounter (INDEPENDENT_AMBULATORY_CARE_PROVIDER_SITE_OTHER): Payer: Medicare Other | Admitting: Ophthalmology

## 2017-05-06 DIAGNOSIS — I10 Essential (primary) hypertension: Secondary | ICD-10-CM

## 2017-05-06 DIAGNOSIS — H35033 Hypertensive retinopathy, bilateral: Secondary | ICD-10-CM

## 2017-05-06 DIAGNOSIS — E11319 Type 2 diabetes mellitus with unspecified diabetic retinopathy without macular edema: Secondary | ICD-10-CM | POA: Diagnosis not present

## 2017-05-06 DIAGNOSIS — E113293 Type 2 diabetes mellitus with mild nonproliferative diabetic retinopathy without macular edema, bilateral: Secondary | ICD-10-CM

## 2017-05-06 DIAGNOSIS — H43813 Vitreous degeneration, bilateral: Secondary | ICD-10-CM

## 2017-05-06 DIAGNOSIS — D3131 Benign neoplasm of right choroid: Secondary | ICD-10-CM | POA: Diagnosis not present

## 2017-06-26 DIAGNOSIS — Z23 Encounter for immunization: Secondary | ICD-10-CM | POA: Diagnosis not present

## 2017-07-15 DIAGNOSIS — I10 Essential (primary) hypertension: Secondary | ICD-10-CM | POA: Diagnosis not present

## 2017-07-15 DIAGNOSIS — Z1211 Encounter for screening for malignant neoplasm of colon: Secondary | ICD-10-CM | POA: Diagnosis not present

## 2017-07-15 DIAGNOSIS — E119 Type 2 diabetes mellitus without complications: Secondary | ICD-10-CM | POA: Diagnosis not present

## 2017-07-15 DIAGNOSIS — M722 Plantar fascial fibromatosis: Secondary | ICD-10-CM | POA: Diagnosis not present

## 2017-08-26 DIAGNOSIS — Z86010 Personal history of colonic polyps: Secondary | ICD-10-CM | POA: Diagnosis not present

## 2017-08-26 DIAGNOSIS — Z800 Family history of malignant neoplasm of digestive organs: Secondary | ICD-10-CM | POA: Diagnosis not present

## 2017-10-11 DIAGNOSIS — D126 Benign neoplasm of colon, unspecified: Secondary | ICD-10-CM | POA: Diagnosis not present

## 2017-10-11 DIAGNOSIS — K5730 Diverticulosis of large intestine without perforation or abscess without bleeding: Secondary | ICD-10-CM | POA: Diagnosis not present

## 2017-10-11 DIAGNOSIS — Z800 Family history of malignant neoplasm of digestive organs: Secondary | ICD-10-CM | POA: Diagnosis not present

## 2017-10-11 DIAGNOSIS — K635 Polyp of colon: Secondary | ICD-10-CM | POA: Diagnosis not present

## 2017-10-11 DIAGNOSIS — K633 Ulcer of intestine: Secondary | ICD-10-CM | POA: Diagnosis not present

## 2017-10-11 DIAGNOSIS — K529 Noninfective gastroenteritis and colitis, unspecified: Secondary | ICD-10-CM | POA: Diagnosis not present

## 2017-10-11 DIAGNOSIS — Z86010 Personal history of colonic polyps: Secondary | ICD-10-CM | POA: Diagnosis not present

## 2017-10-16 DIAGNOSIS — K529 Noninfective gastroenteritis and colitis, unspecified: Secondary | ICD-10-CM | POA: Diagnosis not present

## 2017-10-16 DIAGNOSIS — D126 Benign neoplasm of colon, unspecified: Secondary | ICD-10-CM | POA: Diagnosis not present

## 2017-10-29 DIAGNOSIS — E210 Primary hyperparathyroidism: Secondary | ICD-10-CM | POA: Diagnosis not present

## 2017-10-29 DIAGNOSIS — M810 Age-related osteoporosis without current pathological fracture: Secondary | ICD-10-CM | POA: Diagnosis not present

## 2017-11-04 DIAGNOSIS — L814 Other melanin hyperpigmentation: Secondary | ICD-10-CM | POA: Diagnosis not present

## 2017-11-04 DIAGNOSIS — L820 Inflamed seborrheic keratosis: Secondary | ICD-10-CM | POA: Diagnosis not present

## 2017-11-04 DIAGNOSIS — D1801 Hemangioma of skin and subcutaneous tissue: Secondary | ICD-10-CM | POA: Diagnosis not present

## 2017-11-04 DIAGNOSIS — D225 Melanocytic nevi of trunk: Secondary | ICD-10-CM | POA: Diagnosis not present

## 2017-12-10 DIAGNOSIS — H40013 Open angle with borderline findings, low risk, bilateral: Secondary | ICD-10-CM | POA: Diagnosis not present

## 2017-12-10 DIAGNOSIS — Z961 Presence of intraocular lens: Secondary | ICD-10-CM | POA: Diagnosis not present

## 2017-12-10 DIAGNOSIS — E119 Type 2 diabetes mellitus without complications: Secondary | ICD-10-CM | POA: Diagnosis not present

## 2017-12-10 DIAGNOSIS — H35373 Puckering of macula, bilateral: Secondary | ICD-10-CM | POA: Diagnosis not present

## 2018-01-17 DIAGNOSIS — I129 Hypertensive chronic kidney disease with stage 1 through stage 4 chronic kidney disease, or unspecified chronic kidney disease: Secondary | ICD-10-CM | POA: Diagnosis not present

## 2018-01-17 DIAGNOSIS — Z6841 Body Mass Index (BMI) 40.0 and over, adult: Secondary | ICD-10-CM | POA: Diagnosis not present

## 2018-01-17 DIAGNOSIS — N183 Chronic kidney disease, stage 3 (moderate): Secondary | ICD-10-CM | POA: Diagnosis not present

## 2018-01-17 DIAGNOSIS — E1165 Type 2 diabetes mellitus with hyperglycemia: Secondary | ICD-10-CM | POA: Diagnosis not present

## 2018-01-17 DIAGNOSIS — Z1389 Encounter for screening for other disorder: Secondary | ICD-10-CM | POA: Diagnosis not present

## 2018-01-17 DIAGNOSIS — M810 Age-related osteoporosis without current pathological fracture: Secondary | ICD-10-CM | POA: Diagnosis not present

## 2018-01-17 DIAGNOSIS — Z0000 Encounter for general adult medical examination without abnormal findings: Secondary | ICD-10-CM | POA: Diagnosis not present

## 2018-01-17 DIAGNOSIS — E210 Primary hyperparathyroidism: Secondary | ICD-10-CM | POA: Diagnosis not present

## 2018-01-17 DIAGNOSIS — E7800 Pure hypercholesterolemia, unspecified: Secondary | ICD-10-CM | POA: Diagnosis not present

## 2018-01-17 DIAGNOSIS — E1122 Type 2 diabetes mellitus with diabetic chronic kidney disease: Secondary | ICD-10-CM | POA: Diagnosis not present

## 2018-03-11 ENCOUNTER — Other Ambulatory Visit: Payer: Self-pay | Admitting: Internal Medicine

## 2018-03-11 DIAGNOSIS — Z1231 Encounter for screening mammogram for malignant neoplasm of breast: Secondary | ICD-10-CM

## 2018-04-18 ENCOUNTER — Ambulatory Visit
Admission: RE | Admit: 2018-04-18 | Discharge: 2018-04-18 | Disposition: A | Discharge status: Home / Self Care | Payer: Medicare Other | Source: Ambulatory Visit | Attending: Internal Medicine | Admitting: Internal Medicine

## 2018-04-18 DIAGNOSIS — Z1231 Encounter for screening mammogram for malignant neoplasm of breast: Secondary | ICD-10-CM

## 2018-05-06 ENCOUNTER — Encounter (INDEPENDENT_AMBULATORY_CARE_PROVIDER_SITE_OTHER): Payer: Medicare Other | Admitting: Ophthalmology

## 2018-05-06 DIAGNOSIS — H35033 Hypertensive retinopathy, bilateral: Secondary | ICD-10-CM

## 2018-05-06 DIAGNOSIS — E11319 Type 2 diabetes mellitus with unspecified diabetic retinopathy without macular edema: Secondary | ICD-10-CM | POA: Diagnosis not present

## 2018-05-06 DIAGNOSIS — H43813 Vitreous degeneration, bilateral: Secondary | ICD-10-CM

## 2018-05-06 DIAGNOSIS — I10 Essential (primary) hypertension: Secondary | ICD-10-CM

## 2018-05-06 DIAGNOSIS — H35372 Puckering of macula, left eye: Secondary | ICD-10-CM | POA: Diagnosis not present

## 2018-05-06 DIAGNOSIS — E113292 Type 2 diabetes mellitus with mild nonproliferative diabetic retinopathy without macular edema, left eye: Secondary | ICD-10-CM | POA: Diagnosis not present

## 2018-05-06 DIAGNOSIS — D3131 Benign neoplasm of right choroid: Secondary | ICD-10-CM | POA: Diagnosis not present

## 2018-05-06 DIAGNOSIS — E113391 Type 2 diabetes mellitus with moderate nonproliferative diabetic retinopathy without macular edema, right eye: Secondary | ICD-10-CM | POA: Diagnosis not present

## 2018-06-10 DIAGNOSIS — H40013 Open angle with borderline findings, low risk, bilateral: Secondary | ICD-10-CM | POA: Diagnosis not present

## 2018-06-28 DIAGNOSIS — Z23 Encounter for immunization: Secondary | ICD-10-CM | POA: Diagnosis not present

## 2018-08-04 DIAGNOSIS — E1122 Type 2 diabetes mellitus with diabetic chronic kidney disease: Secondary | ICD-10-CM | POA: Diagnosis not present

## 2018-08-04 DIAGNOSIS — E210 Primary hyperparathyroidism: Secondary | ICD-10-CM | POA: Diagnosis not present

## 2018-08-04 DIAGNOSIS — E1139 Type 2 diabetes mellitus with other diabetic ophthalmic complication: Secondary | ICD-10-CM | POA: Diagnosis not present

## 2018-08-04 DIAGNOSIS — I129 Hypertensive chronic kidney disease with stage 1 through stage 4 chronic kidney disease, or unspecified chronic kidney disease: Secondary | ICD-10-CM | POA: Diagnosis not present

## 2018-08-04 DIAGNOSIS — E11319 Type 2 diabetes mellitus with unspecified diabetic retinopathy without macular edema: Secondary | ICD-10-CM | POA: Diagnosis not present

## 2018-08-04 DIAGNOSIS — N183 Chronic kidney disease, stage 3 (moderate): Secondary | ICD-10-CM | POA: Diagnosis not present

## 2018-09-18 DIAGNOSIS — M25511 Pain in right shoulder: Secondary | ICD-10-CM | POA: Diagnosis not present

## 2018-09-18 DIAGNOSIS — Z8719 Personal history of other diseases of the digestive system: Secondary | ICD-10-CM | POA: Diagnosis not present

## 2018-09-18 DIAGNOSIS — M25551 Pain in right hip: Secondary | ICD-10-CM | POA: Diagnosis not present

## 2018-10-06 DIAGNOSIS — M79601 Pain in right arm: Secondary | ICD-10-CM | POA: Diagnosis not present

## 2018-10-31 DIAGNOSIS — D175 Benign lipomatous neoplasm of intra-abdominal organs: Secondary | ICD-10-CM | POA: Diagnosis not present

## 2018-10-31 DIAGNOSIS — Z86010 Personal history of colonic polyps: Secondary | ICD-10-CM | POA: Diagnosis not present

## 2018-10-31 DIAGNOSIS — K621 Rectal polyp: Secondary | ICD-10-CM | POA: Diagnosis not present

## 2018-10-31 DIAGNOSIS — K5730 Diverticulosis of large intestine without perforation or abscess without bleeding: Secondary | ICD-10-CM | POA: Diagnosis not present

## 2018-10-31 DIAGNOSIS — Z800 Family history of malignant neoplasm of digestive organs: Secondary | ICD-10-CM | POA: Diagnosis not present

## 2018-11-04 DIAGNOSIS — L821 Other seborrheic keratosis: Secondary | ICD-10-CM | POA: Diagnosis not present

## 2018-11-04 DIAGNOSIS — K621 Rectal polyp: Secondary | ICD-10-CM | POA: Diagnosis not present

## 2018-11-04 DIAGNOSIS — D1801 Hemangioma of skin and subcutaneous tissue: Secondary | ICD-10-CM | POA: Diagnosis not present

## 2018-11-04 DIAGNOSIS — L820 Inflamed seborrheic keratosis: Secondary | ICD-10-CM | POA: Diagnosis not present

## 2018-11-04 DIAGNOSIS — D225 Melanocytic nevi of trunk: Secondary | ICD-10-CM | POA: Diagnosis not present

## 2018-11-04 DIAGNOSIS — L814 Other melanin hyperpigmentation: Secondary | ICD-10-CM | POA: Diagnosis not present

## 2018-11-04 DIAGNOSIS — L570 Actinic keratosis: Secondary | ICD-10-CM | POA: Diagnosis not present

## 2019-01-27 DIAGNOSIS — Z1389 Encounter for screening for other disorder: Secondary | ICD-10-CM | POA: Diagnosis not present

## 2019-01-27 DIAGNOSIS — Z0000 Encounter for general adult medical examination without abnormal findings: Secondary | ICD-10-CM | POA: Diagnosis not present

## 2019-02-10 ENCOUNTER — Other Ambulatory Visit: Payer: Self-pay | Admitting: Internal Medicine

## 2019-02-10 DIAGNOSIS — Z7984 Long term (current) use of oral hypoglycemic drugs: Secondary | ICD-10-CM | POA: Diagnosis not present

## 2019-02-10 DIAGNOSIS — I129 Hypertensive chronic kidney disease with stage 1 through stage 4 chronic kidney disease, or unspecified chronic kidney disease: Secondary | ICD-10-CM | POA: Diagnosis not present

## 2019-02-10 DIAGNOSIS — M810 Age-related osteoporosis without current pathological fracture: Secondary | ICD-10-CM | POA: Diagnosis not present

## 2019-02-10 DIAGNOSIS — Z1231 Encounter for screening mammogram for malignant neoplasm of breast: Secondary | ICD-10-CM

## 2019-02-10 DIAGNOSIS — Z8739 Personal history of other diseases of the musculoskeletal system and connective tissue: Secondary | ICD-10-CM

## 2019-02-10 DIAGNOSIS — N183 Chronic kidney disease, stage 3 (moderate): Secondary | ICD-10-CM | POA: Diagnosis not present

## 2019-02-10 DIAGNOSIS — E210 Primary hyperparathyroidism: Secondary | ICD-10-CM | POA: Diagnosis not present

## 2019-02-10 DIAGNOSIS — E7800 Pure hypercholesterolemia, unspecified: Secondary | ICD-10-CM | POA: Diagnosis not present

## 2019-02-10 DIAGNOSIS — E1122 Type 2 diabetes mellitus with diabetic chronic kidney disease: Secondary | ICD-10-CM | POA: Diagnosis not present

## 2019-02-10 DIAGNOSIS — R609 Edema, unspecified: Secondary | ICD-10-CM | POA: Diagnosis not present

## 2019-04-27 IMAGING — DX DG FOOT COMPLETE 3+V*L*
3 series · 3 of 3 positions shown · non-contrast
Comparison: 05/30/2005.

CLINICAL DATA: Left heel pain, 2 months duration.

EXAM:
LEFT FOOT - COMPLETE 3+ VIEW

[dg foot complete left (1 of 3)]
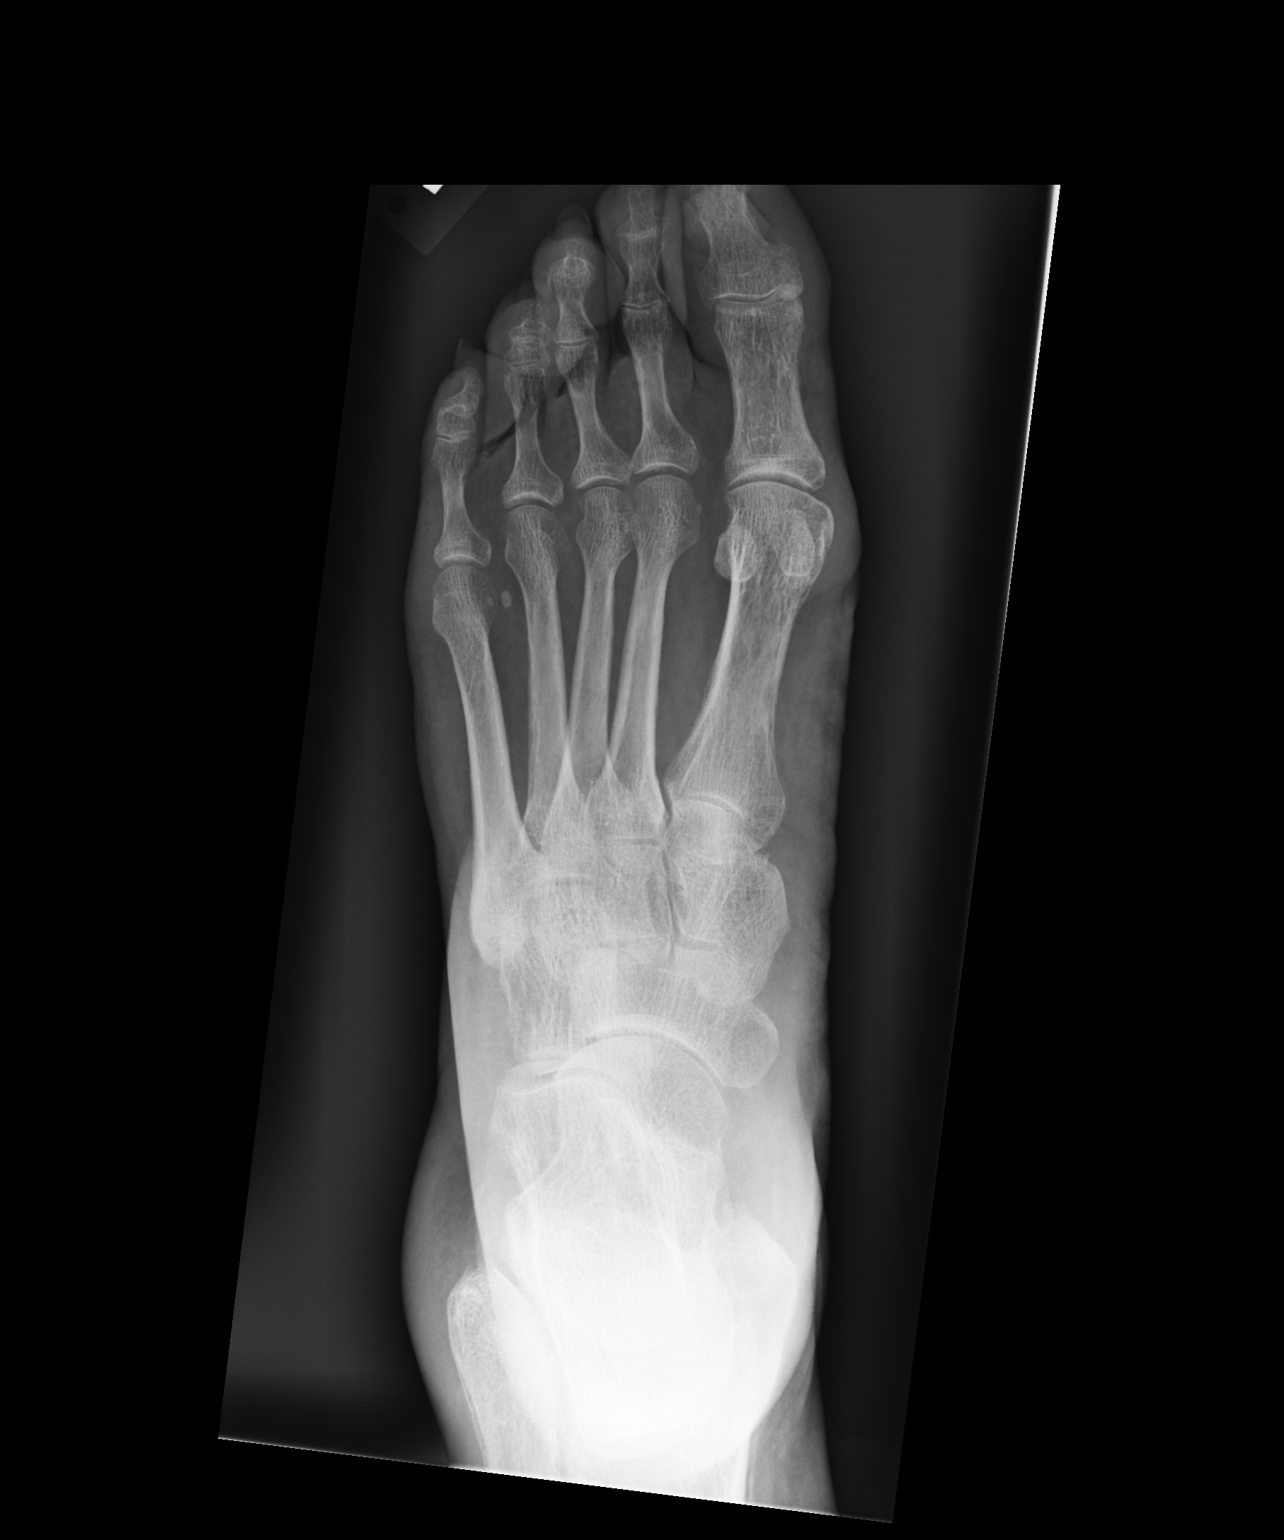

[dg foot complete left (2 of 3)]
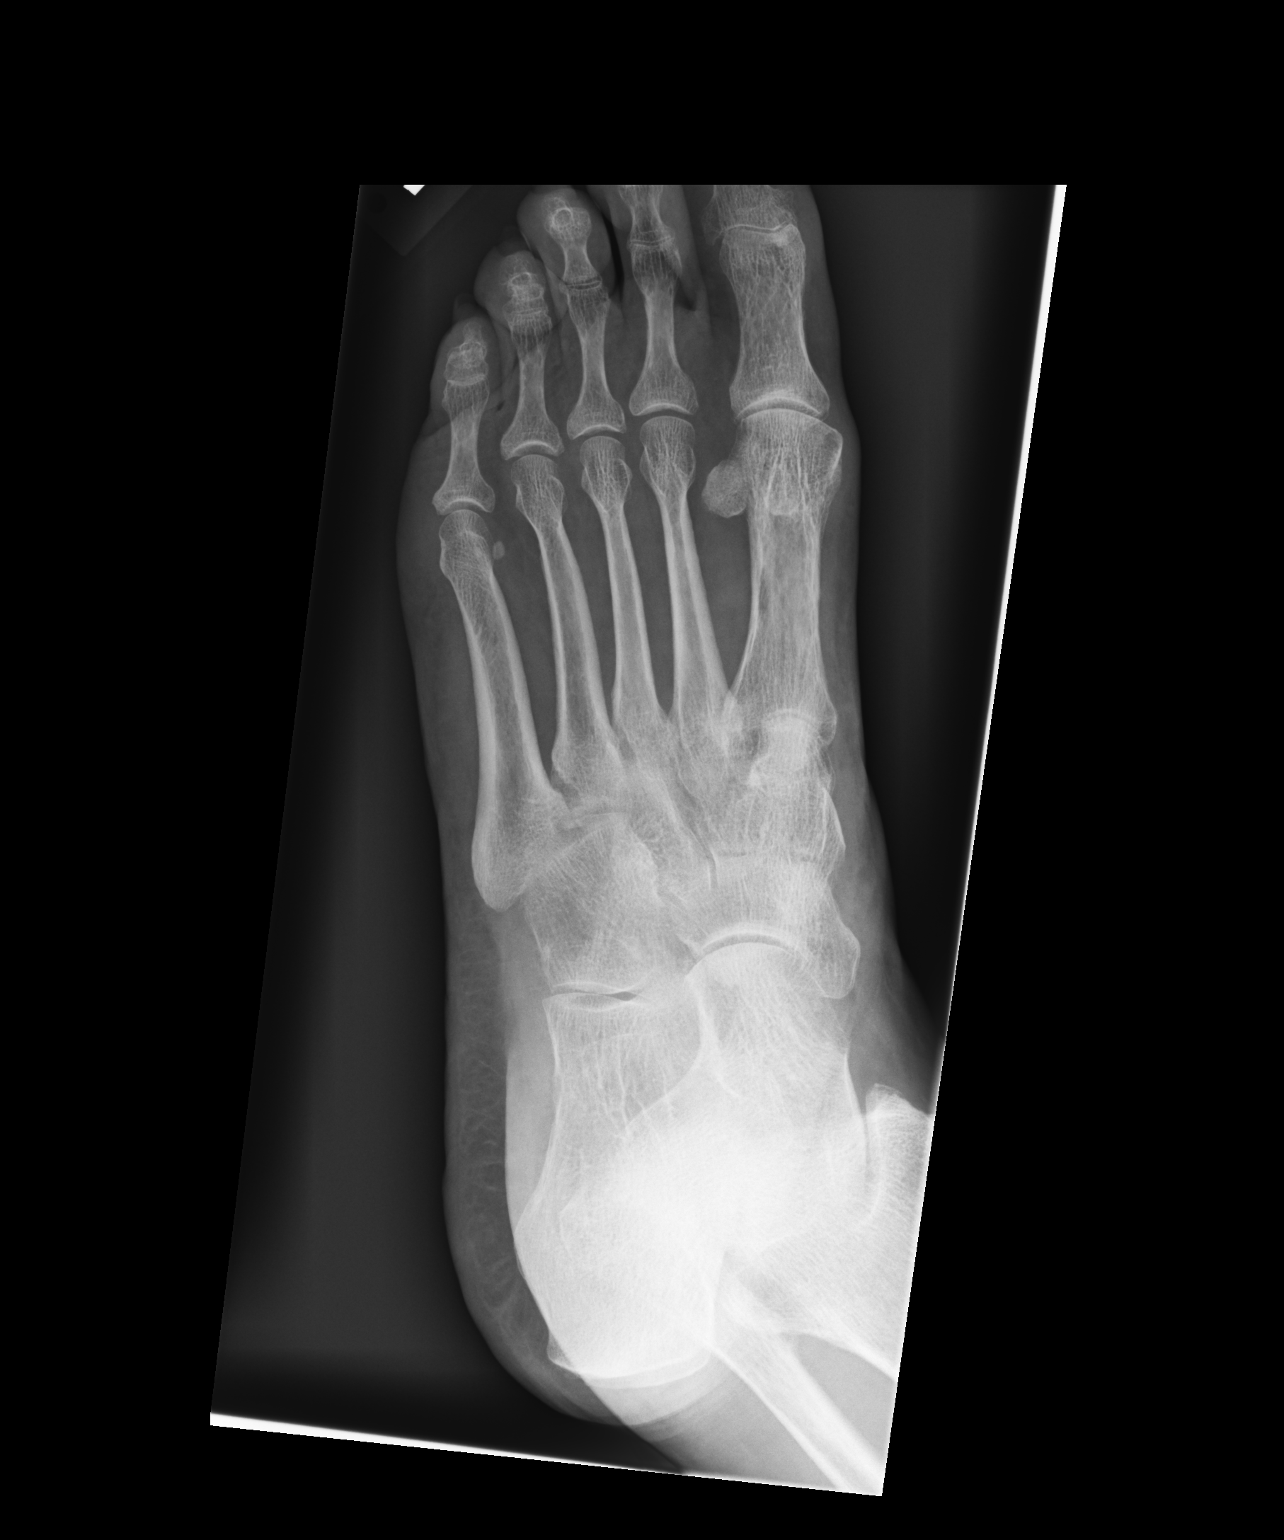

[dg foot complete left (3 of 3)]
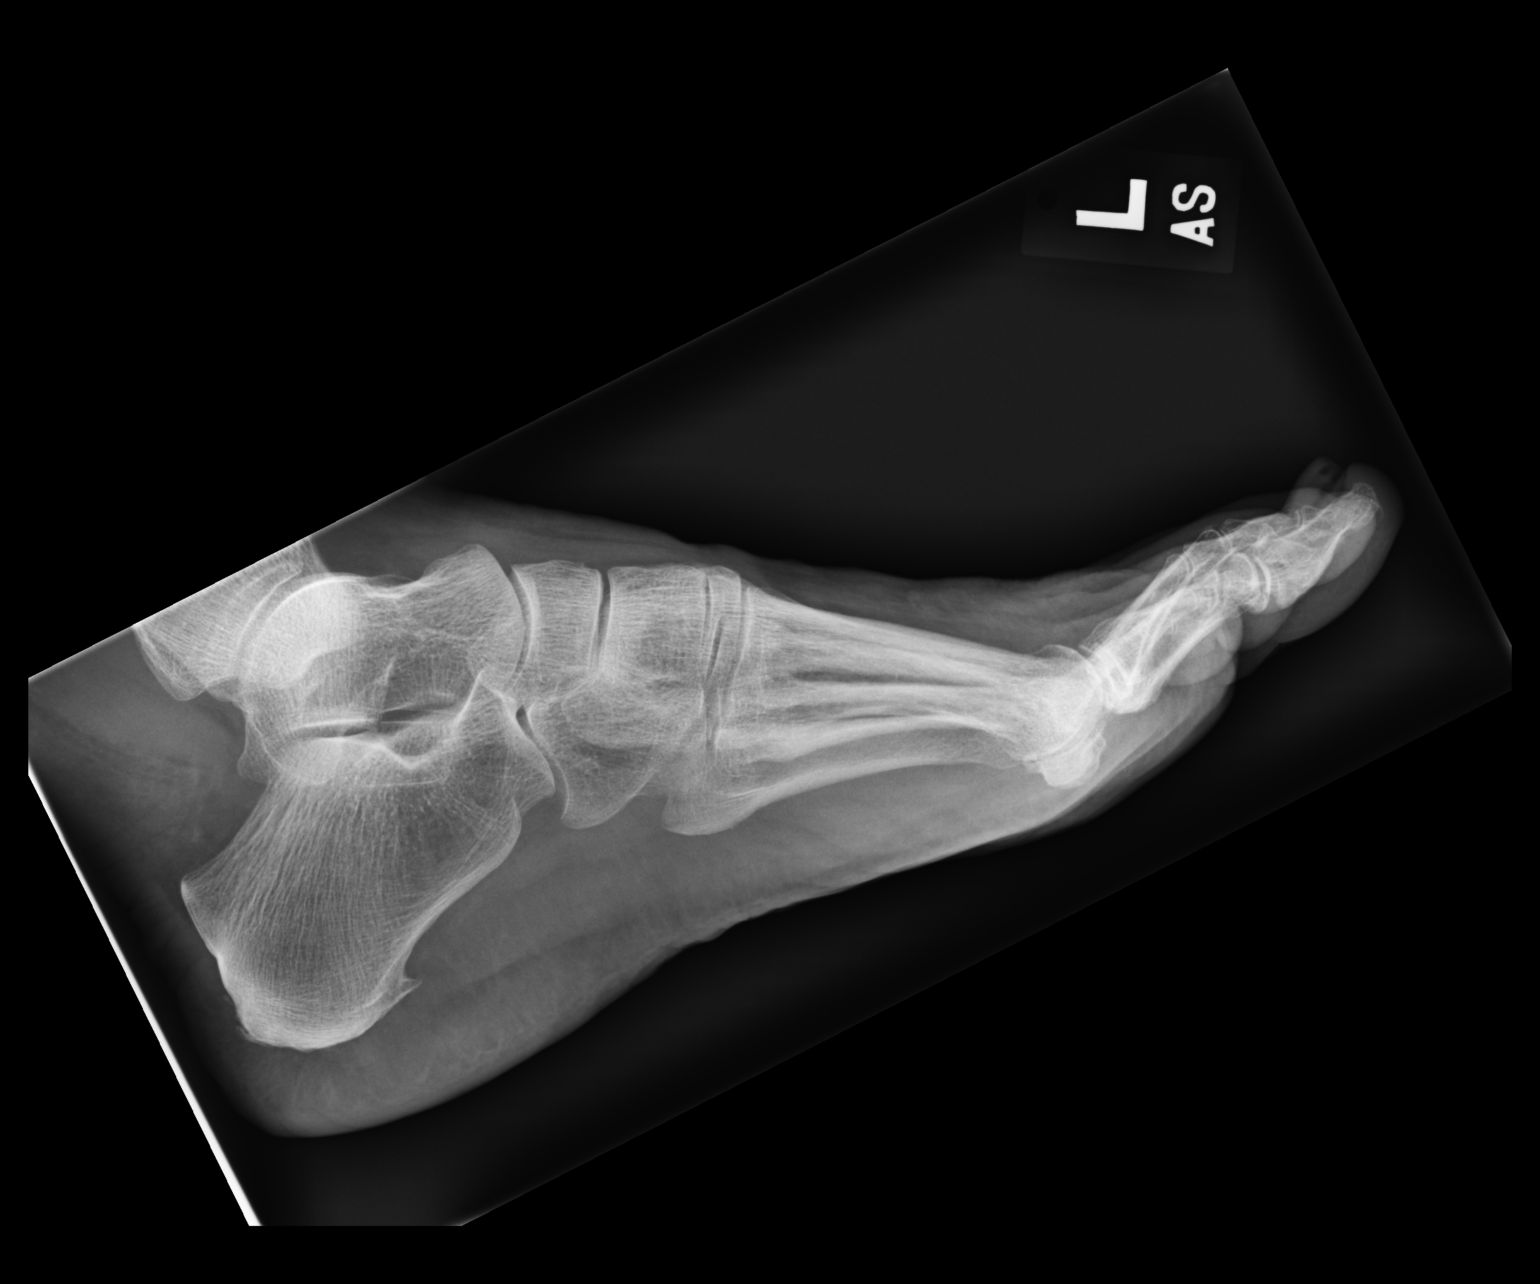

[3 of 3 positions shown; findings below may reference images not displayed]

FINDINGS: Small plantar calcaneal spur. No other focal finding. No sign of
fracture or stress fracture. Remainder the foot appears normal.
IMPRESSION: Small plantar calcaneal spur.

## 2019-04-30 ENCOUNTER — Other Ambulatory Visit: Payer: Self-pay

## 2019-04-30 ENCOUNTER — Ambulatory Visit
Admission: RE | Admit: 2019-04-30 | Discharge: 2019-04-30 | Disposition: A | Discharge status: Home / Self Care | Payer: Medicare Other | Source: Ambulatory Visit | Attending: Internal Medicine | Admitting: Internal Medicine

## 2019-04-30 DIAGNOSIS — Z1231 Encounter for screening mammogram for malignant neoplasm of breast: Secondary | ICD-10-CM | POA: Diagnosis not present

## 2019-04-30 DIAGNOSIS — Z8739 Personal history of other diseases of the musculoskeletal system and connective tissue: Secondary | ICD-10-CM

## 2019-04-30 DIAGNOSIS — M85851 Other specified disorders of bone density and structure, right thigh: Secondary | ICD-10-CM | POA: Diagnosis not present

## 2019-04-30 DIAGNOSIS — Z780 Asymptomatic menopausal state: Secondary | ICD-10-CM | POA: Diagnosis not present

## 2019-05-05 DIAGNOSIS — H35373 Puckering of macula, bilateral: Secondary | ICD-10-CM | POA: Diagnosis not present

## 2019-05-05 DIAGNOSIS — H40013 Open angle with borderline findings, low risk, bilateral: Secondary | ICD-10-CM | POA: Diagnosis not present

## 2019-05-05 DIAGNOSIS — H02822 Cysts of right lower eyelid: Secondary | ICD-10-CM | POA: Diagnosis not present

## 2019-05-05 DIAGNOSIS — E119 Type 2 diabetes mellitus without complications: Secondary | ICD-10-CM | POA: Diagnosis not present

## 2019-05-08 ENCOUNTER — Other Ambulatory Visit: Payer: Self-pay

## 2019-05-08 ENCOUNTER — Encounter (INDEPENDENT_AMBULATORY_CARE_PROVIDER_SITE_OTHER): Payer: Medicare Other | Admitting: Ophthalmology

## 2019-05-08 DIAGNOSIS — H43813 Vitreous degeneration, bilateral: Secondary | ICD-10-CM

## 2019-05-08 DIAGNOSIS — E113293 Type 2 diabetes mellitus with mild nonproliferative diabetic retinopathy without macular edema, bilateral: Secondary | ICD-10-CM | POA: Diagnosis not present

## 2019-05-08 DIAGNOSIS — H35033 Hypertensive retinopathy, bilateral: Secondary | ICD-10-CM

## 2019-05-08 DIAGNOSIS — I10 Essential (primary) hypertension: Secondary | ICD-10-CM

## 2019-05-08 DIAGNOSIS — D3131 Benign neoplasm of right choroid: Secondary | ICD-10-CM | POA: Diagnosis not present

## 2019-05-08 DIAGNOSIS — E11319 Type 2 diabetes mellitus with unspecified diabetic retinopathy without macular edema: Secondary | ICD-10-CM

## 2019-05-08 DIAGNOSIS — H35372 Puckering of macula, left eye: Secondary | ICD-10-CM

## 2019-06-11 DIAGNOSIS — Z23 Encounter for immunization: Secondary | ICD-10-CM | POA: Diagnosis not present

## 2019-07-27 DIAGNOSIS — E1122 Type 2 diabetes mellitus with diabetic chronic kidney disease: Secondary | ICD-10-CM | POA: Diagnosis not present

## 2019-07-27 DIAGNOSIS — I129 Hypertensive chronic kidney disease with stage 1 through stage 4 chronic kidney disease, or unspecified chronic kidney disease: Secondary | ICD-10-CM | POA: Diagnosis not present

## 2019-07-27 DIAGNOSIS — E210 Primary hyperparathyroidism: Secondary | ICD-10-CM | POA: Diagnosis not present

## 2019-08-19 DIAGNOSIS — E119 Type 2 diabetes mellitus without complications: Secondary | ICD-10-CM | POA: Diagnosis not present

## 2019-08-19 DIAGNOSIS — E7800 Pure hypercholesterolemia, unspecified: Secondary | ICD-10-CM | POA: Diagnosis not present

## 2019-08-19 DIAGNOSIS — M810 Age-related osteoporosis without current pathological fracture: Secondary | ICD-10-CM | POA: Diagnosis not present

## 2019-08-19 DIAGNOSIS — N183 Chronic kidney disease, stage 3 unspecified: Secondary | ICD-10-CM | POA: Diagnosis not present

## 2019-08-19 DIAGNOSIS — E11319 Type 2 diabetes mellitus with unspecified diabetic retinopathy without macular edema: Secondary | ICD-10-CM | POA: Diagnosis not present

## 2019-08-19 DIAGNOSIS — E1139 Type 2 diabetes mellitus with other diabetic ophthalmic complication: Secondary | ICD-10-CM | POA: Diagnosis not present

## 2019-08-19 DIAGNOSIS — I129 Hypertensive chronic kidney disease with stage 1 through stage 4 chronic kidney disease, or unspecified chronic kidney disease: Secondary | ICD-10-CM | POA: Diagnosis not present

## 2019-08-19 DIAGNOSIS — E1122 Type 2 diabetes mellitus with diabetic chronic kidney disease: Secondary | ICD-10-CM | POA: Diagnosis not present

## 2019-11-03 DIAGNOSIS — H40013 Open angle with borderline findings, low risk, bilateral: Secondary | ICD-10-CM | POA: Diagnosis not present

## 2020-04-04 ENCOUNTER — Other Ambulatory Visit: Payer: Self-pay | Admitting: Internal Medicine

## 2020-04-04 DIAGNOSIS — Z1231 Encounter for screening mammogram for malignant neoplasm of breast: Secondary | ICD-10-CM

## 2020-04-08 DIAGNOSIS — M810 Age-related osteoporosis without current pathological fracture: Secondary | ICD-10-CM | POA: Diagnosis not present

## 2020-04-08 DIAGNOSIS — E11319 Type 2 diabetes mellitus with unspecified diabetic retinopathy without macular edema: Secondary | ICD-10-CM | POA: Diagnosis not present

## 2020-04-08 DIAGNOSIS — E1122 Type 2 diabetes mellitus with diabetic chronic kidney disease: Secondary | ICD-10-CM | POA: Diagnosis not present

## 2020-04-08 DIAGNOSIS — I129 Hypertensive chronic kidney disease with stage 1 through stage 4 chronic kidney disease, or unspecified chronic kidney disease: Secondary | ICD-10-CM | POA: Diagnosis not present

## 2020-04-08 DIAGNOSIS — E7800 Pure hypercholesterolemia, unspecified: Secondary | ICD-10-CM | POA: Diagnosis not present

## 2020-04-08 DIAGNOSIS — E1139 Type 2 diabetes mellitus with other diabetic ophthalmic complication: Secondary | ICD-10-CM | POA: Diagnosis not present

## 2020-04-08 DIAGNOSIS — N183 Chronic kidney disease, stage 3 unspecified: Secondary | ICD-10-CM | POA: Diagnosis not present

## 2020-04-08 DIAGNOSIS — I10 Essential (primary) hypertension: Secondary | ICD-10-CM | POA: Diagnosis not present

## 2020-04-08 DIAGNOSIS — E119 Type 2 diabetes mellitus without complications: Secondary | ICD-10-CM | POA: Diagnosis not present

## 2020-04-08 DIAGNOSIS — N1831 Chronic kidney disease, stage 3a: Secondary | ICD-10-CM | POA: Diagnosis not present

## 2020-05-03 ENCOUNTER — Ambulatory Visit: Payer: Medicare Other

## 2020-05-06 ENCOUNTER — Other Ambulatory Visit: Payer: Self-pay

## 2020-05-06 ENCOUNTER — Ambulatory Visit
Admission: RE | Admit: 2020-05-06 | Discharge: 2020-05-06 | Disposition: A | Discharge status: Home / Self Care | Payer: Medicare Other | Source: Ambulatory Visit | Attending: Internal Medicine | Admitting: Internal Medicine

## 2020-05-06 DIAGNOSIS — Z1231 Encounter for screening mammogram for malignant neoplasm of breast: Secondary | ICD-10-CM | POA: Diagnosis not present

## 2020-05-09 ENCOUNTER — Other Ambulatory Visit: Payer: Self-pay

## 2020-05-09 ENCOUNTER — Encounter (INDEPENDENT_AMBULATORY_CARE_PROVIDER_SITE_OTHER): Payer: Medicare Other | Admitting: Ophthalmology

## 2020-05-09 DIAGNOSIS — H35372 Puckering of macula, left eye: Secondary | ICD-10-CM

## 2020-05-09 DIAGNOSIS — H35033 Hypertensive retinopathy, bilateral: Secondary | ICD-10-CM

## 2020-05-09 DIAGNOSIS — D3131 Benign neoplasm of right choroid: Secondary | ICD-10-CM | POA: Diagnosis not present

## 2020-05-09 DIAGNOSIS — I10 Essential (primary) hypertension: Secondary | ICD-10-CM | POA: Diagnosis not present

## 2020-05-09 DIAGNOSIS — H43813 Vitreous degeneration, bilateral: Secondary | ICD-10-CM

## 2020-06-15 DIAGNOSIS — Z23 Encounter for immunization: Secondary | ICD-10-CM | POA: Diagnosis not present

## 2020-08-17 DIAGNOSIS — E1122 Type 2 diabetes mellitus with diabetic chronic kidney disease: Secondary | ICD-10-CM | POA: Diagnosis not present

## 2020-08-17 DIAGNOSIS — E1139 Type 2 diabetes mellitus with other diabetic ophthalmic complication: Secondary | ICD-10-CM | POA: Diagnosis not present

## 2020-08-17 DIAGNOSIS — I10 Essential (primary) hypertension: Secondary | ICD-10-CM | POA: Diagnosis not present

## 2020-08-17 DIAGNOSIS — M810 Age-related osteoporosis without current pathological fracture: Secondary | ICD-10-CM | POA: Diagnosis not present

## 2020-08-17 DIAGNOSIS — N183 Chronic kidney disease, stage 3 unspecified: Secondary | ICD-10-CM | POA: Diagnosis not present

## 2020-08-17 DIAGNOSIS — K219 Gastro-esophageal reflux disease without esophagitis: Secondary | ICD-10-CM | POA: Diagnosis not present

## 2020-08-17 DIAGNOSIS — E7800 Pure hypercholesterolemia, unspecified: Secondary | ICD-10-CM | POA: Diagnosis not present

## 2020-08-17 DIAGNOSIS — N1831 Chronic kidney disease, stage 3a: Secondary | ICD-10-CM | POA: Diagnosis not present

## 2020-08-17 DIAGNOSIS — I129 Hypertensive chronic kidney disease with stage 1 through stage 4 chronic kidney disease, or unspecified chronic kidney disease: Secondary | ICD-10-CM | POA: Diagnosis not present

## 2020-08-17 DIAGNOSIS — E11319 Type 2 diabetes mellitus with unspecified diabetic retinopathy without macular edema: Secondary | ICD-10-CM | POA: Diagnosis not present

## 2020-08-17 DIAGNOSIS — E119 Type 2 diabetes mellitus without complications: Secondary | ICD-10-CM | POA: Diagnosis not present

## 2020-08-23 DIAGNOSIS — M25552 Pain in left hip: Secondary | ICD-10-CM | POA: Diagnosis not present

## 2020-08-23 DIAGNOSIS — I10 Essential (primary) hypertension: Secondary | ICD-10-CM | POA: Diagnosis not present

## 2020-08-23 DIAGNOSIS — E1122 Type 2 diabetes mellitus with diabetic chronic kidney disease: Secondary | ICD-10-CM | POA: Diagnosis not present

## 2020-08-26 DIAGNOSIS — Z23 Encounter for immunization: Secondary | ICD-10-CM | POA: Diagnosis not present

## 2020-08-26 DIAGNOSIS — E119 Type 2 diabetes mellitus without complications: Secondary | ICD-10-CM | POA: Diagnosis not present

## 2020-08-26 DIAGNOSIS — H40013 Open angle with borderline findings, low risk, bilateral: Secondary | ICD-10-CM | POA: Diagnosis not present

## 2020-08-26 DIAGNOSIS — H35373 Puckering of macula, bilateral: Secondary | ICD-10-CM | POA: Diagnosis not present

## 2020-08-26 DIAGNOSIS — H02822 Cysts of right lower eyelid: Secondary | ICD-10-CM | POA: Diagnosis not present

## 2020-11-01 DIAGNOSIS — K219 Gastro-esophageal reflux disease without esophagitis: Secondary | ICD-10-CM | POA: Diagnosis not present

## 2020-11-01 DIAGNOSIS — M810 Age-related osteoporosis without current pathological fracture: Secondary | ICD-10-CM | POA: Diagnosis not present

## 2020-11-01 DIAGNOSIS — N183 Chronic kidney disease, stage 3 unspecified: Secondary | ICD-10-CM | POA: Diagnosis not present

## 2020-11-01 DIAGNOSIS — I10 Essential (primary) hypertension: Secondary | ICD-10-CM | POA: Diagnosis not present

## 2020-11-01 DIAGNOSIS — I129 Hypertensive chronic kidney disease with stage 1 through stage 4 chronic kidney disease, or unspecified chronic kidney disease: Secondary | ICD-10-CM | POA: Diagnosis not present

## 2020-11-01 DIAGNOSIS — E7800 Pure hypercholesterolemia, unspecified: Secondary | ICD-10-CM | POA: Diagnosis not present

## 2020-11-01 DIAGNOSIS — E119 Type 2 diabetes mellitus without complications: Secondary | ICD-10-CM | POA: Diagnosis not present

## 2020-11-01 DIAGNOSIS — E1122 Type 2 diabetes mellitus with diabetic chronic kidney disease: Secondary | ICD-10-CM | POA: Diagnosis not present

## 2021-03-18 DIAGNOSIS — Z20822 Contact with and (suspected) exposure to covid-19: Secondary | ICD-10-CM | POA: Diagnosis not present

## 2021-04-13 ENCOUNTER — Other Ambulatory Visit: Payer: Self-pay | Admitting: Internal Medicine

## 2021-04-13 DIAGNOSIS — Z1231 Encounter for screening mammogram for malignant neoplasm of breast: Secondary | ICD-10-CM

## 2021-05-01 DIAGNOSIS — E210 Primary hyperparathyroidism: Secondary | ICD-10-CM | POA: Diagnosis not present

## 2021-05-01 DIAGNOSIS — E1139 Type 2 diabetes mellitus with other diabetic ophthalmic complication: Secondary | ICD-10-CM | POA: Diagnosis not present

## 2021-05-01 DIAGNOSIS — M810 Age-related osteoporosis without current pathological fracture: Secondary | ICD-10-CM | POA: Diagnosis not present

## 2021-05-01 DIAGNOSIS — Z6841 Body Mass Index (BMI) 40.0 and over, adult: Secondary | ICD-10-CM | POA: Diagnosis not present

## 2021-05-01 DIAGNOSIS — R609 Edema, unspecified: Secondary | ICD-10-CM | POA: Diagnosis not present

## 2021-05-01 DIAGNOSIS — E7800 Pure hypercholesterolemia, unspecified: Secondary | ICD-10-CM | POA: Diagnosis not present

## 2021-05-01 DIAGNOSIS — E11319 Type 2 diabetes mellitus with unspecified diabetic retinopathy without macular edema: Secondary | ICD-10-CM | POA: Diagnosis not present

## 2021-05-01 DIAGNOSIS — E1122 Type 2 diabetes mellitus with diabetic chronic kidney disease: Secondary | ICD-10-CM | POA: Diagnosis not present

## 2021-05-01 DIAGNOSIS — N1831 Chronic kidney disease, stage 3a: Secondary | ICD-10-CM | POA: Diagnosis not present

## 2021-05-01 DIAGNOSIS — Z1389 Encounter for screening for other disorder: Secondary | ICD-10-CM | POA: Diagnosis not present

## 2021-05-01 DIAGNOSIS — I129 Hypertensive chronic kidney disease with stage 1 through stage 4 chronic kidney disease, or unspecified chronic kidney disease: Secondary | ICD-10-CM | POA: Diagnosis not present

## 2021-05-01 DIAGNOSIS — Z0000 Encounter for general adult medical examination without abnormal findings: Secondary | ICD-10-CM | POA: Diagnosis not present

## 2021-05-04 DIAGNOSIS — N1831 Chronic kidney disease, stage 3a: Secondary | ICD-10-CM | POA: Diagnosis not present

## 2021-05-04 DIAGNOSIS — K219 Gastro-esophageal reflux disease without esophagitis: Secondary | ICD-10-CM | POA: Diagnosis not present

## 2021-05-04 DIAGNOSIS — I10 Essential (primary) hypertension: Secondary | ICD-10-CM | POA: Diagnosis not present

## 2021-05-04 DIAGNOSIS — E7800 Pure hypercholesterolemia, unspecified: Secondary | ICD-10-CM | POA: Diagnosis not present

## 2021-05-04 DIAGNOSIS — M810 Age-related osteoporosis without current pathological fracture: Secondary | ICD-10-CM | POA: Diagnosis not present

## 2021-05-04 DIAGNOSIS — E11319 Type 2 diabetes mellitus with unspecified diabetic retinopathy without macular edema: Secondary | ICD-10-CM | POA: Diagnosis not present

## 2021-05-04 DIAGNOSIS — E1139 Type 2 diabetes mellitus with other diabetic ophthalmic complication: Secondary | ICD-10-CM | POA: Diagnosis not present

## 2021-05-04 DIAGNOSIS — E119 Type 2 diabetes mellitus without complications: Secondary | ICD-10-CM | POA: Diagnosis not present

## 2021-05-04 DIAGNOSIS — E1122 Type 2 diabetes mellitus with diabetic chronic kidney disease: Secondary | ICD-10-CM | POA: Diagnosis not present

## 2021-05-04 DIAGNOSIS — I129 Hypertensive chronic kidney disease with stage 1 through stage 4 chronic kidney disease, or unspecified chronic kidney disease: Secondary | ICD-10-CM | POA: Diagnosis not present

## 2021-05-08 ENCOUNTER — Other Ambulatory Visit: Payer: Self-pay | Admitting: Internal Medicine

## 2021-05-08 DIAGNOSIS — Z23 Encounter for immunization: Secondary | ICD-10-CM | POA: Diagnosis not present

## 2021-05-08 DIAGNOSIS — M810 Age-related osteoporosis without current pathological fracture: Secondary | ICD-10-CM

## 2021-05-09 ENCOUNTER — Encounter (INDEPENDENT_AMBULATORY_CARE_PROVIDER_SITE_OTHER): Payer: Medicare Other | Admitting: Ophthalmology

## 2021-05-09 ENCOUNTER — Other Ambulatory Visit: Payer: Self-pay

## 2021-05-09 DIAGNOSIS — H35372 Puckering of macula, left eye: Secondary | ICD-10-CM | POA: Diagnosis not present

## 2021-05-09 DIAGNOSIS — E113293 Type 2 diabetes mellitus with mild nonproliferative diabetic retinopathy without macular edema, bilateral: Secondary | ICD-10-CM

## 2021-05-09 DIAGNOSIS — H43813 Vitreous degeneration, bilateral: Secondary | ICD-10-CM

## 2021-05-09 DIAGNOSIS — I10 Essential (primary) hypertension: Secondary | ICD-10-CM | POA: Diagnosis not present

## 2021-05-09 DIAGNOSIS — H35033 Hypertensive retinopathy, bilateral: Secondary | ICD-10-CM

## 2021-05-09 DIAGNOSIS — D3131 Benign neoplasm of right choroid: Secondary | ICD-10-CM

## 2021-05-10 ENCOUNTER — Ambulatory Visit
Admission: RE | Admit: 2021-05-10 | Discharge: 2021-05-10 | Disposition: A | Discharge status: Home / Self Care | Payer: Medicare Other | Source: Ambulatory Visit

## 2021-05-10 DIAGNOSIS — Z1231 Encounter for screening mammogram for malignant neoplasm of breast: Secondary | ICD-10-CM

## 2021-05-17 ENCOUNTER — Other Ambulatory Visit: Payer: Self-pay | Admitting: Internal Medicine

## 2021-05-17 DIAGNOSIS — R928 Other abnormal and inconclusive findings on diagnostic imaging of breast: Secondary | ICD-10-CM

## 2021-05-20 ENCOUNTER — Ambulatory Visit
Admission: RE | Admit: 2021-05-20 | Discharge: 2021-05-20 | Disposition: A | Discharge status: Home / Self Care | Payer: Medicare Other | Source: Ambulatory Visit | Attending: Internal Medicine | Admitting: Internal Medicine

## 2021-05-20 ENCOUNTER — Other Ambulatory Visit: Payer: Self-pay

## 2021-05-20 DIAGNOSIS — R590 Localized enlarged lymph nodes: Secondary | ICD-10-CM | POA: Diagnosis not present

## 2021-05-20 DIAGNOSIS — R928 Other abnormal and inconclusive findings on diagnostic imaging of breast: Secondary | ICD-10-CM

## 2021-05-20 DIAGNOSIS — N6489 Other specified disorders of breast: Secondary | ICD-10-CM | POA: Diagnosis not present

## 2021-05-31 ENCOUNTER — Other Ambulatory Visit: Payer: Medicare Other

## 2021-06-23 DIAGNOSIS — Z23 Encounter for immunization: Secondary | ICD-10-CM | POA: Diagnosis not present

## 2021-07-17 ENCOUNTER — Emergency Department (HOSPITAL_COMMUNITY): Payer: Medicare Other

## 2021-07-17 ENCOUNTER — Inpatient Hospital Stay (HOSPITAL_COMMUNITY): Payer: Medicare Other

## 2021-07-17 ENCOUNTER — Encounter (HOSPITAL_COMMUNITY): Payer: Self-pay | Admitting: Emergency Medicine

## 2021-07-17 ENCOUNTER — Inpatient Hospital Stay (HOSPITAL_COMMUNITY)
Admission: EM | Admit: 2021-07-17 | Discharge: 2021-07-26 | DRG: 871 | Disposition: A | Discharge status: Home Health | Payer: Medicare Other | Attending: Family Medicine | Admitting: Family Medicine

## 2021-07-17 DIAGNOSIS — R1900 Intra-abdominal and pelvic swelling, mass and lump, unspecified site: Secondary | ICD-10-CM | POA: Diagnosis not present

## 2021-07-17 DIAGNOSIS — E1165 Type 2 diabetes mellitus with hyperglycemia: Secondary | ICD-10-CM | POA: Diagnosis present

## 2021-07-17 DIAGNOSIS — A419 Sepsis, unspecified organism: Principal | ICD-10-CM

## 2021-07-17 DIAGNOSIS — E1122 Type 2 diabetes mellitus with diabetic chronic kidney disease: Secondary | ICD-10-CM | POA: Diagnosis present

## 2021-07-17 DIAGNOSIS — R109 Unspecified abdominal pain: Secondary | ICD-10-CM | POA: Diagnosis not present

## 2021-07-17 DIAGNOSIS — J90 Pleural effusion, not elsewhere classified: Secondary | ICD-10-CM | POA: Diagnosis not present

## 2021-07-17 DIAGNOSIS — D1771 Benign lipomatous neoplasm of kidney: Secondary | ICD-10-CM | POA: Diagnosis not present

## 2021-07-17 DIAGNOSIS — K859 Acute pancreatitis without necrosis or infection, unspecified: Secondary | ICD-10-CM | POA: Diagnosis present

## 2021-07-17 DIAGNOSIS — Z20822 Contact with and (suspected) exposure to covid-19: Secondary | ICD-10-CM | POA: Diagnosis present

## 2021-07-17 DIAGNOSIS — D3001 Benign neoplasm of right kidney: Secondary | ICD-10-CM | POA: Diagnosis not present

## 2021-07-17 DIAGNOSIS — K449 Diaphragmatic hernia without obstruction or gangrene: Secondary | ICD-10-CM | POA: Diagnosis present

## 2021-07-17 DIAGNOSIS — E876 Hypokalemia: Secondary | ICD-10-CM | POA: Diagnosis present

## 2021-07-17 DIAGNOSIS — N9489 Other specified conditions associated with female genital organs and menstrual cycle: Secondary | ICD-10-CM

## 2021-07-17 DIAGNOSIS — J9811 Atelectasis: Secondary | ICD-10-CM | POA: Diagnosis present

## 2021-07-17 DIAGNOSIS — K829 Disease of gallbladder, unspecified: Secondary | ICD-10-CM | POA: Diagnosis not present

## 2021-07-17 DIAGNOSIS — M79671 Pain in right foot: Secondary | ICD-10-CM | POA: Diagnosis not present

## 2021-07-17 DIAGNOSIS — R9389 Abnormal findings on diagnostic imaging of other specified body structures: Secondary | ICD-10-CM | POA: Diagnosis not present

## 2021-07-17 DIAGNOSIS — N8500 Endometrial hyperplasia, unspecified: Secondary | ICD-10-CM | POA: Diagnosis present

## 2021-07-17 DIAGNOSIS — R1011 Right upper quadrant pain: Secondary | ICD-10-CM | POA: Diagnosis not present

## 2021-07-17 DIAGNOSIS — R17 Unspecified jaundice: Secondary | ICD-10-CM

## 2021-07-17 DIAGNOSIS — Z6838 Body mass index (BMI) 38.0-38.9, adult: Secondary | ICD-10-CM

## 2021-07-17 DIAGNOSIS — K760 Fatty (change of) liver, not elsewhere classified: Secondary | ICD-10-CM | POA: Diagnosis present

## 2021-07-17 DIAGNOSIS — R6520 Severe sepsis without septic shock: Secondary | ICD-10-CM | POA: Diagnosis present

## 2021-07-17 DIAGNOSIS — R1110 Vomiting, unspecified: Secondary | ICD-10-CM | POA: Diagnosis not present

## 2021-07-17 DIAGNOSIS — Z800 Family history of malignant neoplasm of digestive organs: Secondary | ICD-10-CM

## 2021-07-17 DIAGNOSIS — Z9851 Tubal ligation status: Secondary | ICD-10-CM

## 2021-07-17 DIAGNOSIS — J189 Pneumonia, unspecified organism: Secondary | ICD-10-CM

## 2021-07-17 DIAGNOSIS — K5730 Diverticulosis of large intestine without perforation or abscess without bleeding: Secondary | ICD-10-CM | POA: Diagnosis not present

## 2021-07-17 DIAGNOSIS — R918 Other nonspecific abnormal finding of lung field: Secondary | ICD-10-CM | POA: Diagnosis not present

## 2021-07-17 DIAGNOSIS — R1013 Epigastric pain: Secondary | ICD-10-CM | POA: Diagnosis not present

## 2021-07-17 DIAGNOSIS — R110 Nausea: Secondary | ICD-10-CM | POA: Diagnosis not present

## 2021-07-17 DIAGNOSIS — M7989 Other specified soft tissue disorders: Secondary | ICD-10-CM | POA: Diagnosis not present

## 2021-07-17 DIAGNOSIS — K219 Gastro-esophageal reflux disease without esophagitis: Secondary | ICD-10-CM | POA: Diagnosis present

## 2021-07-17 DIAGNOSIS — N838 Other noninflammatory disorders of ovary, fallopian tube and broad ligament: Secondary | ICD-10-CM

## 2021-07-17 DIAGNOSIS — E210 Primary hyperparathyroidism: Secondary | ICD-10-CM | POA: Diagnosis present

## 2021-07-17 DIAGNOSIS — D259 Leiomyoma of uterus, unspecified: Secondary | ICD-10-CM | POA: Diagnosis not present

## 2021-07-17 DIAGNOSIS — E871 Hypo-osmolality and hyponatremia: Secondary | ICD-10-CM | POA: Diagnosis present

## 2021-07-17 DIAGNOSIS — J180 Bronchopneumonia, unspecified organism: Secondary | ICD-10-CM | POA: Diagnosis present

## 2021-07-17 DIAGNOSIS — M109 Gout, unspecified: Secondary | ICD-10-CM | POA: Diagnosis present

## 2021-07-17 DIAGNOSIS — I10 Essential (primary) hypertension: Secondary | ICD-10-CM | POA: Diagnosis not present

## 2021-07-17 DIAGNOSIS — R112 Nausea with vomiting, unspecified: Secondary | ICD-10-CM | POA: Diagnosis not present

## 2021-07-17 DIAGNOSIS — I9589 Other hypotension: Secondary | ICD-10-CM | POA: Diagnosis present

## 2021-07-17 DIAGNOSIS — I4430 Unspecified atrioventricular block: Secondary | ICD-10-CM | POA: Diagnosis not present

## 2021-07-17 DIAGNOSIS — E669 Obesity, unspecified: Secondary | ICD-10-CM | POA: Diagnosis present

## 2021-07-17 DIAGNOSIS — E119 Type 2 diabetes mellitus without complications: Secondary | ICD-10-CM | POA: Diagnosis not present

## 2021-07-17 DIAGNOSIS — K8689 Other specified diseases of pancreas: Secondary | ICD-10-CM | POA: Diagnosis not present

## 2021-07-17 DIAGNOSIS — R933 Abnormal findings on diagnostic imaging of other parts of digestive tract: Secondary | ICD-10-CM | POA: Diagnosis not present

## 2021-07-17 DIAGNOSIS — D252 Subserosal leiomyoma of uterus: Secondary | ICD-10-CM | POA: Diagnosis present

## 2021-07-17 DIAGNOSIS — E7800 Pure hypercholesterolemia, unspecified: Secondary | ICD-10-CM | POA: Diagnosis present

## 2021-07-17 DIAGNOSIS — R52 Pain, unspecified: Secondary | ICD-10-CM

## 2021-07-17 DIAGNOSIS — I517 Cardiomegaly: Secondary | ICD-10-CM | POA: Diagnosis not present

## 2021-07-17 DIAGNOSIS — I129 Hypertensive chronic kidney disease with stage 1 through stage 4 chronic kidney disease, or unspecified chronic kidney disease: Secondary | ICD-10-CM | POA: Diagnosis present

## 2021-07-17 DIAGNOSIS — N1831 Chronic kidney disease, stage 3a: Secondary | ICD-10-CM | POA: Diagnosis present

## 2021-07-17 DIAGNOSIS — N179 Acute kidney failure, unspecified: Secondary | ICD-10-CM

## 2021-07-17 DIAGNOSIS — R0602 Shortness of breath: Secondary | ICD-10-CM | POA: Diagnosis not present

## 2021-07-17 DIAGNOSIS — R079 Chest pain, unspecified: Secondary | ICD-10-CM | POA: Diagnosis not present

## 2021-07-17 DIAGNOSIS — R748 Abnormal levels of other serum enzymes: Secondary | ICD-10-CM | POA: Diagnosis not present

## 2021-07-17 HISTORY — DX: Essential (primary) hypertension: I10

## 2021-07-17 HISTORY — DX: Type 2 diabetes mellitus without complications: E11.9

## 2021-07-17 LAB — CBC WITH DIFFERENTIAL/PLATELET
Abs Immature Granulocytes: 0 10*3/uL (ref 0.00–0.07)
Basophils Absolute: 0 10*3/uL (ref 0.0–0.1)
Basophils Relative: 0 %
Eosinophils Absolute: 0 10*3/uL (ref 0.0–0.5)
Eosinophils Relative: 0 %
HCT: 40.9 % (ref 36.0–46.0)
Hemoglobin: 13.5 g/dL (ref 12.0–15.0)
Lymphocytes Relative: 8 %
Lymphs Abs: 2 10*3/uL (ref 0.7–4.0)
MCH: 31 pg (ref 26.0–34.0)
MCHC: 33 g/dL (ref 30.0–36.0)
MCV: 93.8 fL (ref 80.0–100.0)
Monocytes Absolute: 0.3 10*3/uL (ref 0.1–1.0)
Monocytes Relative: 1 %
Neutro Abs: 23.2 10*3/uL — ABNORMAL HIGH (ref 1.7–7.7)
Neutrophils Relative %: 91 %
Platelets: 240 10*3/uL (ref 150–400)
RBC: 4.36 MIL/uL (ref 3.87–5.11)
RDW: 13.5 % (ref 11.5–15.5)
WBC: 25.5 10*3/uL — ABNORMAL HIGH (ref 4.0–10.5)
nRBC: 0 % (ref 0.0–0.2)
nRBC: 0 /100 WBC

## 2021-07-17 LAB — RESP PANEL BY RT-PCR (FLU A&B, COVID) ARPGX2
Influenza A by PCR: NEGATIVE
Influenza B by PCR: NEGATIVE
SARS Coronavirus 2 by RT PCR: NEGATIVE

## 2021-07-17 LAB — URINALYSIS, ROUTINE W REFLEX MICROSCOPIC
Bilirubin Urine: NEGATIVE
Glucose, UA: NEGATIVE mg/dL
Ketones, ur: NEGATIVE mg/dL
Leukocytes,Ua: NEGATIVE
Nitrite: NEGATIVE
Protein, ur: NEGATIVE mg/dL
Specific Gravity, Urine: 1.025 (ref 1.005–1.030)
pH: 5.5 (ref 5.0–8.0)

## 2021-07-17 LAB — LIPASE, BLOOD: Lipase: 33 U/L (ref 11–51)

## 2021-07-17 LAB — COMPREHENSIVE METABOLIC PANEL
ALT: 19 U/L (ref 0–44)
AST: 21 U/L (ref 15–41)
Albumin: 3.4 g/dL — ABNORMAL LOW (ref 3.5–5.0)
Alkaline Phosphatase: 55 U/L (ref 38–126)
Anion gap: 8 (ref 5–15)
BUN: 16 mg/dL (ref 8–23)
CO2: 24 mmol/L (ref 22–32)
Calcium: 9.6 mg/dL (ref 8.9–10.3)
Chloride: 100 mmol/L (ref 98–111)
Creatinine, Ser: 1.26 mg/dL — ABNORMAL HIGH (ref 0.44–1.00)
GFR, Estimated: 44 mL/min — ABNORMAL LOW (ref >60)
Glucose, Bld: 160 mg/dL — ABNORMAL HIGH (ref 70–99)
Potassium: 3.9 mmol/L (ref 3.5–5.1)
Sodium: 132 mmol/L — ABNORMAL LOW (ref 135–145)
Total Bilirubin: 1.8 mg/dL — ABNORMAL HIGH (ref 0.3–1.2)
Total Protein: 7.2 g/dL (ref 6.5–8.1)

## 2021-07-17 LAB — GAMMA GT: GGT: 15 U/L (ref 7–50)

## 2021-07-17 LAB — URINALYSIS, MICROSCOPIC (REFLEX)

## 2021-07-17 LAB — LACTIC ACID, PLASMA
Lactic Acid, Venous: 1.4 mmol/L (ref 0.5–1.9)
Lactic Acid, Venous: 1.5 mmol/L (ref 0.5–1.9)

## 2021-07-17 LAB — PROCALCITONIN: Procalcitonin: 0.73 ng/mL

## 2021-07-17 LAB — GLUCOSE, CAPILLARY: Glucose-Capillary: 132 mg/dL — ABNORMAL HIGH (ref 70–99)

## 2021-07-17 MED ORDER — IOHEXOL 300 MG/ML  SOLN
75.0000 mL | Freq: Once | INTRAMUSCULAR | Status: AC | PRN
  Administered 2021-07-17: 13:00:00 75 mL via INTRAVENOUS

## 2021-07-17 MED ORDER — VANCOMYCIN HCL 1000 MG/200ML IV SOLN
1000.0000 mg | INTRAVENOUS | Status: DC
  Administered 2021-07-18 – 2021-07-21 (×3): 1000 mg via INTRAVENOUS
  Filled 2021-07-17 (×4): qty 200

## 2021-07-17 MED ORDER — HYDROMORPHONE HCL 1 MG/ML IJ SOLN
1.0000 mg | Freq: Once | INTRAMUSCULAR | Status: AC
  Administered 2021-07-17: 13:00:00 1 mg via INTRAVENOUS
  Filled 2021-07-17: qty 1

## 2021-07-17 MED ORDER — SODIUM CHLORIDE 0.9 % IV SOLN: INTRAVENOUS | Status: DC

## 2021-07-17 MED ORDER — ACETAMINOPHEN 325 MG PO TABS
650.0000 mg | ORAL_TABLET | Freq: Four times a day (QID) | ORAL | Status: DC | PRN
  Administered 2021-07-19 – 2021-07-24 (×6): 650 mg via ORAL
  Filled 2021-07-17 (×6): qty 2

## 2021-07-17 MED ORDER — INSULIN ASPART 100 UNIT/ML IJ SOLN: 0.0000 [IU] | Freq: Three times a day (TID) | INTRAMUSCULAR | Status: DC

## 2021-07-17 MED ORDER — MORPHINE SULFATE (PF) 2 MG/ML IV SOLN
1.0000 mg | INTRAVENOUS | Status: AC | PRN
  Administered 2021-07-17: 1 mg via INTRAVENOUS
  Filled 2021-07-17: qty 1

## 2021-07-17 MED ORDER — SODIUM CHLORIDE 0.9 % IV SOLN: Freq: Once | INTRAVENOUS | Status: AC

## 2021-07-17 MED ORDER — HYDROMORPHONE HCL 1 MG/ML IJ SOLN
1.0000 mg | Freq: Once | INTRAMUSCULAR | Status: DC
  Administered 2021-07-17: 16:00:00 1 mg via INTRAVENOUS

## 2021-07-17 MED ORDER — ROSUVASTATIN CALCIUM 5 MG PO TABS
5.0000 mg | ORAL_TABLET | Freq: Every day | ORAL | Status: DC
  Administered 2021-07-18 – 2021-07-26 (×8): 5 mg via ORAL
  Filled 2021-07-17 (×7): qty 1

## 2021-07-17 MED ORDER — LACTATED RINGERS IV SOLN: INTRAVENOUS | Status: DC

## 2021-07-17 MED ORDER — SODIUM CHLORIDE 0.9 % IV BOLUS: 1000.0000 mL | Freq: Once | INTRAVENOUS | Status: DC

## 2021-07-17 MED ORDER — MORPHINE SULFATE (PF) 2 MG/ML IV SOLN
1.0000 mg | INTRAVENOUS | Status: DC | PRN
  Administered 2021-07-17: 20:00:00 1 mg via INTRAVENOUS
  Filled 2021-07-17: qty 1

## 2021-07-17 MED ORDER — PIPERACILLIN-TAZOBACTAM 3.375 G IVPB 30 MIN
3.3750 g | Freq: Once | INTRAVENOUS | Status: AC
  Administered 2021-07-17: 13:00:00 3.375 g via INTRAVENOUS
  Filled 2021-07-17: qty 50

## 2021-07-17 MED ORDER — ENOXAPARIN SODIUM 40 MG/0.4ML IJ SOSY
40.0000 mg | PREFILLED_SYRINGE | INTRAMUSCULAR | Status: DC
  Administered 2021-07-17 – 2021-07-25 (×9): 40 mg via SUBCUTANEOUS
  Filled 2021-07-17 (×9): qty 0.4

## 2021-07-17 MED ORDER — LACTATED RINGERS IV BOLUS: 1000.0000 mL | Freq: Once | INTRAVENOUS | Status: DC

## 2021-07-17 MED ORDER — ACETAMINOPHEN 650 MG RE SUPP: 650.0000 mg | Freq: Four times a day (QID) | RECTAL | Status: DC | PRN

## 2021-07-17 MED ORDER — VANCOMYCIN HCL 1750 MG/350ML IV SOLN
1750.0000 mg | Freq: Once | INTRAVENOUS | Status: AC
  Administered 2021-07-17: 16:00:00 1750 mg via INTRAVENOUS
  Filled 2021-07-17: qty 350

## 2021-07-17 MED ORDER — ONDANSETRON HCL 4 MG/2ML IJ SOLN: 4.0000 mg | Freq: Four times a day (QID) | INTRAMUSCULAR | Status: DC | PRN

## 2021-07-17 MED ORDER — HYDROMORPHONE HCL 1 MG/ML IJ SOLN: 1.0000 mg | INTRAMUSCULAR | Status: DC | PRN

## 2021-07-17 MED ORDER — SODIUM CHLORIDE 0.9 % IV BOLUS
500.0000 mL | Freq: Once | INTRAVENOUS | Status: AC
  Administered 2021-07-17: 18:00:00 500 mL via INTRAVENOUS

## 2021-07-17 MED ORDER — LACTATED RINGERS IV BOLUS (SEPSIS): 1000.0000 mL | Freq: Once | INTRAVENOUS | Status: DC

## 2021-07-17 MED ORDER — ONDANSETRON HCL 4 MG/2ML IJ SOLN
4.0000 mg | Freq: Once | INTRAMUSCULAR | Status: AC
  Administered 2021-07-17: 13:00:00 4 mg via INTRAVENOUS
  Filled 2021-07-17: qty 2

## 2021-07-17 MED ORDER — ACETAMINOPHEN 500 MG PO TABS
1000.0000 mg | ORAL_TABLET | Freq: Once | ORAL | Status: AC
  Administered 2021-07-17: 13:00:00 1000 mg via ORAL
  Filled 2021-07-17: qty 2

## 2021-07-17 MED ORDER — PIPERACILLIN-TAZOBACTAM 3.375 G IVPB
3.3750 g | Freq: Three times a day (TID) | INTRAVENOUS | Status: AC
  Administered 2021-07-17 – 2021-07-24 (×21): 3.375 g via INTRAVENOUS
  Filled 2021-07-17 (×19): qty 50

## 2021-07-17 MED ORDER — SODIUM CHLORIDE 0.9 % IV BOLUS
2000.0000 mL | Freq: Once | INTRAVENOUS | Status: AC
  Administered 2021-07-17: 13:00:00 2000 mL via INTRAVENOUS

## 2021-07-17 MED ORDER — ONDANSETRON HCL 4 MG PO TABS: 4.0000 mg | ORAL_TABLET | Freq: Four times a day (QID) | ORAL | Status: DC | PRN

## 2021-07-17 MED ORDER — HYDROMORPHONE HCL 1 MG/ML IJ SOLN
2.0000 mg | INTRAMUSCULAR | Status: DC | PRN
  Filled 2021-07-17: qty 2

## 2021-07-17 MED ORDER — ONDANSETRON HCL 4 MG/2ML IJ SOLN
4.0000 mg | Freq: Three times a day (TID) | INTRAMUSCULAR | Status: DC | PRN
  Administered 2021-07-17: 16:00:00 4 mg via INTRAVENOUS
  Filled 2021-07-17: qty 2

## 2021-07-17 NOTE — Progress Notes (Addendum)
Pharmacy Antibiotic Note  Jade Sanchez is a 80 y.o. female admitted on 07/17/2021 presenting with RUQ pain.  Pharmacy has been consulted for vancomycin dosing. Zosyn per MD  Plan: Vancomycin 1750 mg IV x 1, then 1000 mg IV q 24h (eAUC 536, Goal AUC 400-550, SCr 1.26) Monitor renal function, Cx/PCR to narrow Vancomycin levels as needed  ADDENDUM Pharmacy consult for zosyn added Zosyn 3.375g q8h unless change in renal function Lorelei Pont, PharmD, BCPS 07/17/2021 7:51 PM ED Clinical Pharmacist -  [phone]      Temp (24hrs), Avg:101.7 F (38.7 C), Min:101.7 F (38.7 C), Max:101.7 F (38.7 C)  Recent Labs  Lab 07/17/21 1101 07/17/21 1208 07/17/21 1308  WBC 25.5*  --   --   CREATININE 1.26*  --   --   LATICACIDVEN  --  1.4 1.5    CrCl cannot be calculated (Unknown ideal weight.).    No Known Allergies  Bertis Ruddy, PharmD Clinical Pharmacist ED Pharmacist Phone # [phone] 07/17/2021 2:39 PM

## 2021-07-17 NOTE — Consult Note (Addendum)
NAME:  Jade Sanchez, MRN:  [id number], DOB:  March 27, 1941, LOS: 0 ADMISSION DATE:  07/17/2021, CONSULTATION DATE:  11/28 REFERRING MD:  Rogers Blocker, CHIEF COMPLAINT:  hypotension    History of Present Illness:  This is a 80 year old female who presented to the ER 11/28 w/ cc: nausea/vomiting and abd pain. First noticed 11/26 as nausea and vomiting. On 11/27 had RUQ pain and more N/V. These symptoms progressed so she presented to ER for eval  In ER RUQ [place]: ? GB wall thickening no stones CT abd/pelvis: small r effusion, possibel RLL pna, 7 cm adnexal cystic mass, 9 cm solid left adnexal mass, thickened endometrium, right renal angiomyolipoma. Was started on IV vanc and zosyn. Treated w/ IV hydration. Had progressive hypotension in ER after IV dilaudid so PCCM asked to eval   Pertinent  Medical History  HTN, DM , prior laparoscopic tubal ligation   Significant Hospital Events: Including procedures, antibiotic start and stop dates in addition to other pertinent events   11/28 admitted w/ N/V and RUQ pain. RUQ [place] neg murphy sign possible GB wall thickening. Started on vanc and zosyn. Got progressive hypotension in spite of IVFs. Possibly 2/2 dilaudid. PCCM asked to see given hypotension   Interim History / Subjective:  Pain is tolerable   Objective   Blood pressure (Abnormal) 104/37, pulse 84, temperature 99.1 F (37.3 C), temperature source Oral, resp. rate 18, SpO2 95 %.        Intake/Output Summary (Last 24 hours) at 07/17/2021 1850 Last data filed at 07/17/2021 1810 Gross per 24 hour  Intake 2731.25 ml  Output no documentation  Net 2731.25 ml   There were no vitals filed for this visit.  Examination: General: 80 year old female resting in bed. No distress.  HENT: NCAT no JVD MMM Lungs: fine crackles in posterior bases no accessory use  Cardiovascular: RRR Abdomen: soft. Some mild tender RUQ  Extremities: 2 plus LE edema  Neuro: awake and alert  GU: due to void    Resolved Hospital Problem list     Assessment & Plan:  SIRS/severe sepsis 2/2 what seems to be abd source.  -volume responsive thus far but BP did get worse w/ dilaudid  Plan Cont IV hydration  Hold antihypertensives HIDA scan  Stopping narcotics (needs to not have these prior to HIDA anyway) Day 1 vanc/zosyn  Repeat Lactate SBP goal > 100 Don't think she needs ICU at this point  Nausea, vomiting and RUQ abd pain Had some GB wall thickening but otherwise no convincing evidence of cholecystitis. Plan HIDA scan  RLL atx vs PNA Plan IS Pain control  Vanc and zosyn as above  AKI Plan Holding ARB Cont IVFs  Am chem  Renal dose meds   Possible GYN malignancy as evidence by 7 cm adnexal cystic mass, 9 cm solid left adnexal mass, thickened endometrium,  Plan Needs GYN eval   DM w/ hyperglycemia Plan Ssi     Best Practice (right click and "Reselect all SmartList Selections" daily)   Diet/type: NPO DVT prophylaxis: LMWH GI prophylaxis: N/A Lines: N/A Foley:  N/A Code Status:  full code Last date of multidisciplinary goals of care discussion [per primary ]  Labs   CBC: Recent Labs  Lab 07/17/21 1101  WBC 25.5*  NEUTROABS 23.2*  HGB 13.5  HCT 40.9  MCV 93.8  PLT 009    Basic Metabolic Panel: Recent Labs  Lab 07/17/21 1101  NA 132*  K 3.9  CL  100  CO2 24  GLUCOSE 160*  BUN 16  CREATININE 1.26*  CALCIUM 9.6   GFR: CrCl cannot be calculated (Unknown ideal weight.). Recent Labs  Lab 07/17/21 1101 07/17/21 1208 07/17/21 1308  WBC 25.5*  --   --   LATICACIDVEN  --  1.4 1.5    Liver Function Tests: Recent Labs  Lab 07/17/21 1101  AST 21  ALT 19  ALKPHOS 55  BILITOT 1.8*  PROT 7.2  ALBUMIN 3.4*   Recent Labs  Lab 07/17/21 1101  LIPASE 33   No results for input(s): AMMONIA in the last 168 hours.  ABG No results found for: PHART, PCO2ART, PO2ART, HCO3, TCO2, ACIDBASEDEF, O2SAT   Coagulation Profile: No results for  input(s): INR, PROTIME in the last 168 hours.  Cardiac Enzymes: No results for input(s): CKTOTAL, CKMB, CKMBINDEX, TROPONINI in the last 168 hours.  HbA1C: No results found for: HGBA1C  CBG: No results for input(s): GLUCAP in the last 168 hours.  Review of Systems:   Review of Systems  Constitutional:  Positive for fever and malaise/fatigue.  HENT: Negative.    Eyes: Negative.   Respiratory: Negative.    Cardiovascular: Negative.   Gastrointestinal:  Positive for abdominal pain, nausea and vomiting.  Genitourinary: Negative.   Musculoskeletal: Negative.   Skin: Negative.   Neurological: Negative.   Endo/Heme/Allergies: Negative.   Psychiatric/Behavioral: Negative.      Past Medical History:  She,  has a past medical history of Diabetes mellitus without complication ([place]) and Hypertension.   Surgical History:   Past Surgical History:  Procedure Laterality Date   BREAST BIOPSY Bilateral 1989   benign   BREAST EXCISIONAL BIOPSY Bilateral 1989   benign     Social History:      Family History:  Her family history is not on file.   Allergies No Known Allergies   Home Medications  Prior to Admission medications   Not on File     Critical care time: NA   Erick Colace ACNP-BC [place] Pager # [phone] OR # [phone] if no answer

## 2021-07-17 NOTE — ED Provider Notes (Signed)
[facility] EMERGENCY DEPARTMENT Provider Note   CSN: [id number] Arrival date & time: 07/17/21  1034     History Chief Complaint  Patient presents with   Abdominal Pain    Jade Sanchez is a 80 y.o. female.   Abdominal Pain Associated symptoms: chills, fever, nausea, shortness of breath and vomiting   Associated symptoms: no chest pain, no cough, no dysuria, no hematuria and no sore throat    80 year old female with medical history significant for DM 2, hypertension, presenting to the emergency department with sudden onset right upper quadrant pain.  Patient states that she has had nausea and vomiting since Sunday night with right upper quadrant pain that does not radiate.  She endorses persistent nausea, severe, sharp, right upper quadrant pain that is worse with palpation, better with lack of movement.  She states she still has a gallbladder.  She endorses associated chills and mild shortness of breath.  The pain has been constant.  She denied any fevers at home but on arrival was febrile to 101.7.  Initial screening labs were obtained and were positive for leukocytosis to 25.5 following which code sepsis was initiated.  Past Medical History:  Diagnosis Date   Diabetes mellitus without complication ([place])    Hypertension     Patient Active Problem List   Diagnosis Date Noted   Chronic kidney disease, stage 3a ([place]) 07/17/2021   Essential hypertension 07/17/2021   Gastro-esophageal reflux disease without esophagitis 07/17/2021   Hypercholesterolemia 07/17/2021   Primary hyperparathyroidism ([place]) 07/17/2021   Type 2 diabetes mellitus ([place]) 07/17/2021   Sepsis ([place]) 07/17/2021   Pelvic mass 07/17/2021   Elevated bilirubin 07/17/2021   RUQ pain 07/17/2021   Hyponatremia 07/17/2021    Past Surgical History:  Procedure Laterality Date   BREAST BIOPSY Bilateral 1989   benign   BREAST EXCISIONAL BIOPSY Bilateral 1989   benign     OB  History   No obstetric history on file.     No family history on file.     Home Medications Prior to Admission medications   Not on File    Allergies    Patient has no known allergies.  Review of Systems   Review of Systems  Constitutional:  Positive for chills and fever.  HENT:  Negative for ear pain and sore throat.   Respiratory:  Positive for shortness of breath. Negative for cough.   Cardiovascular:  Negative for chest pain and palpitations.  Gastrointestinal:  Positive for abdominal pain, nausea and vomiting.  Genitourinary:  Negative for dysuria and hematuria.  Musculoskeletal:  Negative for arthralgias and back pain.  Skin:  Negative for color change and rash.  Neurological:  Negative for seizures and syncope.  All other systems reviewed and are negative.  Physical Exam Updated Vital Signs BP (!) 104/37   Pulse 84   Temp 99.1 F (37.3 C) (Oral)   Resp 18   SpO2 95%   Physical Exam Vitals and nursing note reviewed.  Constitutional:      General: She is in acute distress.     Appearance: She is well-developed. She is ill-appearing.     Comments: Uncomfortable appearing, in acute pain  HENT:     Head: Normocephalic and atraumatic.  Eyes:     Conjunctiva/sclera: Conjunctivae normal.  Cardiovascular:     Rate and Rhythm: Normal rate and regular rhythm.     Heart sounds: No murmur heard. Pulmonary:     Effort: Pulmonary effort is normal.  No respiratory distress.     Breath sounds: Normal breath sounds.  Abdominal:     Palpations: Abdomen is soft.     Tenderness: There is abdominal tenderness in the right upper quadrant and epigastric area. There is guarding. There is no rebound. Positive signs include Murphy's sign.  Musculoskeletal:        General: No swelling.     Cervical back: Neck supple.  Skin:    General: Skin is warm and dry.     Capillary Refill: Capillary refill takes less than 2 seconds.  Neurological:     Mental Status: She is alert.   Psychiatric:        Mood and Affect: Mood normal.    ED Results / Procedures / Treatments   Labs (all labs ordered are listed, but only abnormal results are displayed) Labs Reviewed  CBC WITH DIFFERENTIAL/PLATELET - Abnormal; Notable for the following components:      Result Value   WBC 25.5 (*)    Neutro Abs 23.2 (*)    All other components within normal limits  COMPREHENSIVE METABOLIC PANEL - Abnormal; Notable for the following components:   Sodium 132 (*)    Glucose, Bld 160 (*)    Creatinine, Ser 1.26 (*)    Albumin 3.4 (*)    Total Bilirubin 1.8 (*)    GFR, Estimated 44 (*)    All other components within normal limits  URINALYSIS, ROUTINE W REFLEX MICROSCOPIC - Abnormal; Notable for the following components:   Color, Urine AMBER (*)    Hgb urine dipstick TRACE (*)    All other components within normal limits  URINALYSIS, MICROSCOPIC (REFLEX) - Abnormal; Notable for the following components:   Bacteria, UA RARE (*)    All other components within normal limits  RESP PANEL BY RT-PCR (FLU A&B, COVID) ARPGX2  CULTURE, BLOOD (ROUTINE X 2)  CULTURE, BLOOD (ROUTINE X 2)  URINE CULTURE  MRSA NEXT GEN BY PCR, NASAL  URINE CULTURE  LIPASE, BLOOD  LACTIC ACID, PLASMA  LACTIC ACID, PLASMA  GAMMA GT  PROCALCITONIN  PROCALCITONIN  COMPREHENSIVE METABOLIC PANEL  CBC    EKG None  Radiology US PELVIS LIMITED (TRANSABDOMINAL ONLY)  Result Date: 07/17/2021 CLINICAL DATA:  Abnormal CT. EXAM: TRANSABDOMINAL ULTRASOUND OF PELVIS TECHNIQUE: Transabdominal ultrasound examination of the pelvis was performed including evaluation of the uterus, ovaries, adnexal regions, and pelvic cul-de-sac. COMPARISON:  Abdominal ultrasound and CT abdomen and pelvis 07/17/2021. FINDINGS: Uterus Measurements: 8.1 x 4.0 x 4.9 cm = volume: 82 mL. 1.8 x 0.7 x 1.9 cm calcified exophytic fibroid. Myometrium otherwise within normal limits. Endometrium Thickness: 9 mm.  Difficult to visualize.  Appears  echogenic. Right ovary Not visualized. Left ovary There is a 5.7 x 6.4 x 7.2 cm mass in the left adnexa. This is slightly hypoechoic. Separate normal appearing left ovary is not visualized. Other findings:  No abnormal free fluid. IMPRESSION: 1. Technically limited study. 2. Indeterminate left adnexal mass measuring 7.2 cm may be related to ovarian malignancy or other ovarian tumor. 3. Endometrium appears thickened and echogenic. Differential diagnosis includes endometrial hyperplasia, polyp or carcinoma. 4. Right ovary not visualized. 5. Small calcified uterine fibroid. Electronically Signed   By: Ronney Asters M.D.   On: 07/17/2021 17:23   CT ABDOMEN PELVIS W CONTRAST  Result Date: 07/17/2021 CLINICAL DATA:  Constant severe right upper quadrant abdominal pain since yesterday. Fever. Intermittent vomiting. EXAM: CT ABDOMEN AND PELVIS WITH CONTRAST TECHNIQUE: Multidetector CT imaging of the abdomen and  pelvis was performed using the standard protocol following bolus administration of intravenous contrast. CONTRAST:  77mL OMNIPAQUE IOHEXOL 300 MG/ML  SOLN COMPARISON:  None. FINDINGS: Lower chest: Dense consolidation in the posterior right middle lobe with small air bronchograms. Mild patchy and linear atelectasis in the right lower lobe. Small right pleural effusion. Mild atelectasis at the left lung base. Hepatobiliary: Borderline dilated gallbladder, measuring 10.3 cm in length. No visible gallstones. No gallbladder wall thickening or pericholecystic fluid. Two small cysts in the left lobe of the liver. Pancreas: Unremarkable. No pancreatic ductal dilatation or surrounding inflammatory changes. Spleen: Normal in size without focal abnormality. Adrenals/Urinary Tract: 2.1 cm homogeneously fat density mass in the lateral right kidney. Unremarkable left kidney, ureters, urinary bladder and adrenal glands. Stomach/Bowel: Multiple sigmoid and distal descending colon diverticula without evidence of diverticulitis.  Fluid in the right colon without wall thickening. Unremarkable stomach, small bowel and appendix. Vascular/Lymphatic: Atheromatous arterial calcifications without aneurysm. No enlarged lymph nodes. Reproductive: Normal sized uterus containing a 1.4 cm anterior exophytic densely calcified fibroid. Heterogeneous low density in the endometrial cavity measuring 3.4 x 2.3 cm on image number 74/3. Cystic mass posterior to the uterus on the right which without supper identification of the right ovary. This measures 7.0 x 4.2 cm on image number 75/3. Heterogeneous solid mass posterior to the uterus on the left without separate identification of the left ovary. This mass measures 9.1 x 7.8 cm on image number 74/3. Other: Small umbilical hernia containing fat. No free peritoneal air seen. Minimal free fluid in the left posterior para colonic gutter. Musculoskeletal: Lumbar and lower thoracic spine degenerative changes with associated grade 1 anterolisthesis at the L4-5 level. No pars defects are seen. Mild scoliosis. IMPRESSION: 1. Dense right middle lobe atelectasis or pneumonia. 2. Small right pleural effusion. 3. Mild bibasilar atelectasis. 4. 7.0 cm cystic right adnexal mass and 9.1 cm solid left adnexal mass. These could be further evaluated with elective pelvic ultrasound or MRI of the pelvis. 5. Thickened, heterogeneous endometrium, suspicious for endometrial carcinoma. 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 7. Sigmoid and distal descending colon diverticulosis. Electronically Signed   By: Claudie Revering M.D.   On: 07/17/2021 13:57   US Abdomen Limited  Result Date: 07/17/2021 CLINICAL DATA:  RIGHT upper quadrant pain. EXAM: ULTRASOUND ABDOMEN LIMITED RIGHT UPPER QUADRANT COMPARISON:  CT abdomen from earlier same day. FINDINGS: Gallbladder: Gallbladder is distended. Portions of the gallbladder are suboptimally visualized. Questionable gallbladder wall thickening at the fundus. No pericholecystic fluid. No sonographic  Murphy's sign elicited during the exam, per the sonographer. Common bile duct: Diameter: 4 mm Liver: No focal lesion identified. Generalized hyperechogenicity suggesting some degree of fatty infiltration. Portal vein is patent on color Doppler imaging with normal direction of blood flow towards the liver. Other: None. IMPRESSION: 1. No convincing evidence of acute cholecystitis. No gallstones seen. Questionable gallbladder wall thickening at the fundus. If localizable RIGHT upper quadrant pain persists or worsens, consider nuclear medicine HIDA scan for further characterization. 2. No bile duct dilatation. 3. Probable fatty infiltration of the liver. Electronically Signed   By: Franki Cabot M.D.   On: 07/17/2021 14:32    Procedures .Critical Care Performed by: Regan Lemming, MD Authorized by: Regan Lemming, MD   Critical care provider statement:    Critical care time (minutes):  124   Critical care was necessary to treat or prevent imminent or life-threatening deterioration of the following conditions:  Sepsis   Critical care was time spent personally by  me on the following activities:  Development of treatment plan with patient or surrogate, discussions with consultants, evaluation of patient's response to treatment, examination of patient, obtaining history from patient or surrogate, ordering and performing treatments and interventions, ordering and review of laboratory studies, ordering and review of radiographic studies, pulse oximetry, re-evaluation of patient's condition and review of old charts   Care discussed with: admitting provider     Medications Ordered in ED Medications  vancomycin (VANCOREADY) IVPB 1000 mg/200 mL (has no administration in time range)  piperacillin-tazobactam (ZOSYN) IVPB 3.375 g (has no administration in time range)  enoxaparin (LOVENOX) injection 40 mg (has no administration in time range)  acetaminophen (TYLENOL) tablet 650 mg (has no administration in time  range)    Or  acetaminophen (TYLENOL) suppository 650 mg (has no administration in time range)  ondansetron (ZOFRAN) tablet 4 mg (has no administration in time range)    Or  ondansetron (ZOFRAN) injection 4 mg (has no administration in time range)  morphine 2 MG/ML injection 1 mg (1 mg Intravenous Given 07/17/21 1947)  piperacillin-tazobactam (ZOSYN) IVPB 3.375 g (0 g Intravenous Stopped 07/17/21 1312)  sodium chloride 0.9 % bolus 2,000 mL (0 mLs Intravenous Stopped 07/17/21 1803)  0.9 %  sodium chloride infusion (0 mLs Intravenous Stopped 07/17/21 1810)  acetaminophen (TYLENOL) tablet 1,000 mg (1,000 mg Oral Given 07/17/21 1243)  ondansetron (ZOFRAN) injection 4 mg (4 mg Intravenous Given 07/17/21 1254)  HYDROmorphone (DILAUDID) injection 1 mg (1 mg Intravenous Given 07/17/21 1254)  iohexol (OMNIPAQUE) 300 MG/ML solution 75 mL (75 mLs Intravenous Contrast Given 07/17/21 1327)  vancomycin (VANCOREADY) IVPB 1750 mg/350 mL (0 mg Intravenous Stopped 07/17/21 1732)  sodium chloride 0.9 % bolus 500 mL (0 mLs Intravenous Stopped 07/17/21 1943)    ED Course  I have reviewed the triage vital signs and the nursing notes.  Pertinent labs & imaging results that were available during my care of the patient were reviewed by me and considered in my medical decision making (see chart for details).    MDM Rules/Calculators/A&P                           80 year old female with medical history significant for DM 2, hypertension, presenting to the emergency department with sudden onset right upper quadrant pain.  Patient states that she has had nausea and vomiting since Sunday night with right upper quadrant pain that does not radiate.  She endorses persistent nausea, severe, sharp, right upper quadrant pain that is worse with palpation, better with lack of movement.  She states she still has a gallbladder.  She endorses associated chills and mild shortness of breath.  The pain has been constant.  She  denied any fevers at home but on arrival was febrile to 101.7.  Initial screening labs were obtained and were positive for leukocytosis to 25.5 following which code sepsis was initiated.  Vitals significant for temperature 101.7, mildly elevated heart rate to the high 90s, O2 saturations 96% on room air, blood pressure 134/79.  Physical exam significant for right upper quadrant tenderness to palpation with a positive Murphy sign.  Concern for intra-abdominal infectious process such as cholecystitis, choledocholithiasis and ascending cholangitis, peptic ulcer disease, pancreatitis, appendicitis, small bowel obstruction, pneumonia, other infectious etiology such as intra-abdominal abscess.  Imaging obtained to include right upper quadrant ultrasound with no convincing evidence of acute cholecystitis, mild thickening of the gallbladder wall at the fundus with mild dilatation of the  gallbladder, normal CBD.  Recommend HIDA scan if concern for acute cholecystitis.  CT abdomen pelvis revealed dense right middle lobe atelectasis versus pneumonia, small right pleural effusion, 7 cm cystic right adnexal mass and 9.1 cm solid left adnexal mass, thickened heterogenous endometrium suspicious for endometrial carcinoma.  Laboratory work-up significant for lactic acid of 1.4, subsequent check 1.5, sodium 132, BUN 16, creatinine 1.26, T bili 1.8, GGT collected and pending, AST 21, ALT 19, WBC 25.5, Hgb 13.5, urinalysis without evidence of UTI.  Due to concern for sepsis, the patient was started on broad-spectrum antibiotics with vancomycin and Zosyn.  She was bolused 2 L IV fluid and started on maintenance fluids, administered Dilaudid and IV Zofran.  Due to concern for possible cholecystitis given patient's exam findings and equivocal ultrasound, evidence of sepsis with mildly elevated heart rate, fever, leukocytosis to 25, general surgery consulted for further recommendations.  Additional consideration given to  ascending cholangitis with patient with soft blood pressures, presentation of sepsis, right upper quadrant pain, fever.  No jaundice noted on physical exam.  T bili mildly elevated.  No significant biliary dilation noted on ultrasound.  Did remain hemodynamically stable prior to admission, low diastolic blood pressures noted following Dilaudid administration, normal MAPs.  Hospitalist medicine consulted for admission.   Final Clinical Impression(s) / ED Diagnoses Final diagnoses:  Right upper quadrant abdominal pain  Sepsis, due to unspecified organism, unspecified whether acute organ dysfunction present [facility])    Rx / DC Orders ED Discharge Orders     None        Regan Lemming, MD 07/17/21 1958

## 2021-07-17 NOTE — ED Triage Notes (Signed)
Patient [facility] for evaluation of constant RUQ abdominal that started yesterday, intermittent emesis.   EMS vitals BP 148/68 HR 95 98% on room air RR 20 CBG 164, reports history of diabetes but does not take medication

## 2021-07-17 NOTE — Consult Note (Addendum)
Consult Note  Jade Sanchez March 01, 1941  [id number].    Requesting MD: Dr. Regan Lemming Chief Complaint/Reason for Consult:RUQ pain  HPI:  Patient is a 80 year old female who presented to [facility] today with RUQ abdominal pain. Reports she started having vomiting yesterday morning 11/27. After the vomiting she started having RUQ Pain described as constant, severe, non-radiating. Pain is worse with deep breathing.Took tylenol without relief. Due to persistent pain she came to ED. Associated chills and SOB. Patient denied fever at home but noted to be febrile to 101.7 here. She deneis a history of similar pain. Denies a personal history of cancer but reports a family history of colon cancer. Denies urinary sxs, vaginal discharge, or vaginal bleeding. Denies sick contacts. Her son is at bedside.  PMH significant for T2DM and HTN. Patient has had a prior laparoscopic tubal ligation per her report, due to the risk of prolonged oral birth control therapy. NKDA. Denies use of blood thinners. At baseline lives alone in [place], drives a car, occasionally uses a walker if mobilizing long distances out of the house.  ROS: Review of Systems  Constitutional:  Positive for fever.  Gastrointestinal:  Positive for abdominal pain, nausea and vomiting.   No family history on file.  Past Medical History:  Diagnosis Date   Diabetes mellitus without complication ([place])    Hypertension     Past Surgical History:  Procedure Laterality Date   BREAST BIOPSY Bilateral 1989   benign   BREAST EXCISIONAL BIOPSY Bilateral 1989   benign    Social History:  has no history on file for tobacco use, alcohol use, and drug use.  Allergies: No Known Allergies  (Not in a hospital admission)   Blood pressure (!) 106/46, pulse 84, temperature 99.1 F (37.3 C), temperature source Oral, resp. rate (!) 25, SpO2 95 %. Physical Exam:  General: pleasant, WD, female who is laying in bed and  appears unwell  HEENT: head is normocephalic, atraumatic.  Sclera are anicteric Ears and nose without any masses or lesions.  Mouth is pink and moist Heart: regular, rate, and rhythm.  Normal s1,s2. No obvious murmurs, gallops, or rubs noted.  Palpable radial and pedal pulses bilaterally Lungs: labored breathing on nasal cannula, O2 sats 88% during my exam, diminished breath sounds bilateral lung bases Abd: soft, obese, overall nontender - reports RUQ pain is unchanged with palpation, ND, reducible umbilical hernia present MS: all 4 extremities are symmetrical with no cyanosis, clubbing, or edema. Skin: warm and dry with no masses, lesions, or rashes Neuro: Cranial nerves 2-12 grossly intact, sensation is normal throughout Psych: A&Ox3 with an appropriate affect.   Results for orders placed or performed during the hospital encounter of 07/17/21 (from the past 48 hour(s))  Urinalysis, Routine w reflex microscopic     Status: Abnormal   Collection Time: 07/17/21 10:50 AM  Result Value Ref Range   Color, Urine AMBER (A) YELLOW    Comment: BIOCHEMICALS MAY BE AFFECTED BY COLOR   APPearance CLEAR CLEAR   Specific Gravity, Urine 1.025 1.005 - 1.030   pH 5.5 5.0 - 8.0   Glucose, UA NEGATIVE NEGATIVE mg/dL   Hgb urine dipstick TRACE (A) NEGATIVE   Bilirubin Urine NEGATIVE NEGATIVE   Ketones, ur NEGATIVE NEGATIVE mg/dL   Protein, ur NEGATIVE NEGATIVE mg/dL   Nitrite NEGATIVE NEGATIVE   Leukocytes,Ua NEGATIVE NEGATIVE    Comment: Performed at [facility] [address] [place], [place]  [address]  Urinalysis, Microscopic (reflex)     Status: Abnormal   Collection Time: 07/17/21 10:50 AM  Result Value Ref Range   RBC / HPF 0-5 0 - 5 RBC/hpf   WBC, UA 0-5 0 - 5 WBC/hpf   Bacteria, UA RARE (A) NONE SEEN   Squamous Epithelial / LPF 0-5 0 - 5   Urine-Other LESS THAN 10 mL OF URINE SUBMITTED     Comment: MICROSCOPIC EXAM PERFORMED ON UNCONCENTRATED URINE Performed at [facility], [place] [address] [place], [place] [address]   CBC with Differential     Status: Abnormal   Collection Time: 07/17/21 11:01 AM  Result Value Ref Range   WBC 25.5 (H) 4.0 - 10.5 K/uL   RBC 4.36 3.87 - 5.11 MIL/uL   Hemoglobin 13.5 12.0 - 15.0 g/dL   HCT 40.9 36.0 - 46.0 %   MCV 93.8 80.0 - 100.0 fL   MCH 31.0 26.0 - 34.0 pg   MCHC 33.0 30.0 - 36.0 g/dL   RDW 13.5 11.5 - 15.5 %   Platelets 240 150 - 400 K/uL   nRBC 0.0 0.0 - 0.2 %   Neutrophils Relative % 91 %   Neutro Abs 23.2 (H) 1.7 - 7.7 K/uL   Lymphocytes Relative 8 %   Lymphs Abs 2.0 0.7 - 4.0 K/uL   Monocytes Relative 1 %   Monocytes Absolute 0.3 0.1 - 1.0 K/uL   Eosinophils Relative 0 %   Eosinophils Absolute 0.0 0.0 - 0.5 K/uL   Basophils Relative 0 %   Basophils Absolute 0.0 0.0 - 0.1 K/uL   nRBC 0 0 /100 WBC   Abs Immature Granulocytes 0.00 0.00 - 0.07 K/uL    Comment: Performed at [facility], [address] [place], [place] [address]  Comprehensive metabolic panel     Status: Abnormal   Collection Time: 07/17/21 11:01 AM  Result Value Ref Range   Sodium 132 (L) 135 - 145 mmol/L   Potassium 3.9 3.5 - 5.1 mmol/L   Chloride 100 98 - 111 mmol/L   CO2 24 22 - 32 mmol/L   Glucose, Bld 160 (H) 70 - 99 mg/dL    Comment: Glucose reference range applies only to samples taken after fasting for at least 8 hours.   BUN 16 8 - 23 mg/dL   Creatinine, Ser 1.26 (H) 0.44 - 1.00 mg/dL   Calcium 9.6 8.9 - 10.3 mg/dL   Total Protein 7.2 6.5 - 8.1 g/dL   Albumin 3.4 (L) 3.5 - 5.0 g/dL   AST 21 15 - 41 U/L   ALT 19 0 - 44 U/L   Alkaline Phosphatase 55 38 - 126 U/L   Total Bilirubin 1.8 (H) 0.3 - 1.2 mg/dL   GFR, Estimated 44 (L) >60 mL/min    Comment: (NOTE) Calculated using the CKD-EPI Creatinine Equation (2021)    Anion gap 8 5 - 15    Comment: Performed at [facility] [address] [place], [place] [address]  Lipase, blood     Status: None   Collection Time: 07/17/21 11:01 AM  Result Value Ref Range    Lipase 33 11 - 51 U/L    Comment: Performed at [facility] [address], [place],  [address]  Lactic acid, plasma     Status: None   Collection Time: 07/17/21 12:08 PM  Result Value Ref Range   Lactic Acid, Venous 1.4 0.5 - 1.9 mmol/L  Comment: Performed at [facility], [place] [address], [place], [place] [address]  Lactic acid, plasma     Status: None   Collection Time: 07/17/21  1:08 PM  Result Value Ref Range   Lactic Acid, Venous 1.5 0.5 - 1.9 mmol/L    Comment: Performed at [facility] [address], [place], [place] [address]   CT ABDOMEN PELVIS W CONTRAST  Result Date: 07/17/2021 CLINICAL DATA:  Constant severe right upper quadrant abdominal pain since yesterday. Fever. Intermittent vomiting. EXAM: CT ABDOMEN AND PELVIS WITH CONTRAST TECHNIQUE: Multidetector CT imaging of the abdomen and pelvis was performed using the standard protocol following bolus administration of intravenous contrast. CONTRAST:  84m OMNIPAQUE IOHEXOL 300 MG/ML  SOLN COMPARISON:  None. FINDINGS: Lower chest: Dense consolidation in the posterior right middle lobe with small air bronchograms. Mild patchy and linear atelectasis in the right lower lobe. Small right pleural effusion. Mild atelectasis at the left lung base. Hepatobiliary: Borderline dilated gallbladder, measuring 10.3 cm in length. No visible gallstones. No gallbladder wall thickening or pericholecystic fluid. Two small cysts in the left lobe of the liver. Pancreas: Unremarkable. No pancreatic ductal dilatation or surrounding inflammatory changes. Spleen: Normal in size without focal abnormality. Adrenals/Urinary Tract: 2.1 cm homogeneously fat density mass in the lateral right kidney. Unremarkable left kidney, ureters, urinary bladder and adrenal glands. Stomach/Bowel: Multiple sigmoid and distal descending colon diverticula without evidence of diverticulitis. Fluid in the right colon without wall thickening. Unremarkable  stomach, small bowel and appendix. Vascular/Lymphatic: Atheromatous arterial calcifications without aneurysm. No enlarged lymph nodes. Reproductive: Normal sized uterus containing a 1.4 cm anterior exophytic densely calcified fibroid. Heterogeneous low density in the endometrial cavity measuring 3.4 x 2.3 cm on image number 74/3. Cystic mass posterior to the uterus on the right which without supper identification of the right ovary. This measures 7.0 x 4.2 cm on image number 75/3. Heterogeneous solid mass posterior to the uterus on the left without separate identification of the left ovary. This mass measures 9.1 x 7.8 cm on image number 74/3. Other: Small umbilical hernia containing fat. No free peritoneal air seen. Minimal free fluid in the left posterior para colonic gutter. Musculoskeletal: Lumbar and lower thoracic spine degenerative changes with associated grade 1 anterolisthesis at the L4-5 level. No pars defects are seen. Mild scoliosis. IMPRESSION: 1. Dense right middle lobe atelectasis or pneumonia. 2. Small right pleural effusion. 3. Mild bibasilar atelectasis. 4. 7.0 cm cystic right adnexal mass and 9.1 cm solid left adnexal mass. These could be further evaluated with elective pelvic ultrasound or MRI of the pelvis. 5. Thickened, heterogeneous endometrium, suspicious for endometrial carcinoma. 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 7. Sigmoid and distal descending colon diverticulosis. Electronically Signed   By: SClaudie ReveringM.D.   On: 07/17/2021 13:57   [place]domen Limited  Result Date: 07/17/2021 CLINICAL DATA:  RIGHT upper quadrant pain. EXAM: ULTRASOUND ABDOMEN LIMITED RIGHT UPPER QUADRANT COMPARISON:  CT abdomen from earlier same day. FINDINGS: Gallbladder: Gallbladder is distended. Portions of the gallbladder are suboptimally visualized. Questionable gallbladder wall thickening at the fundus. No pericholecystic fluid. No sonographic Murphy's sign elicited during the exam, per the sonographer.  Common bile duct: Diameter: 4 mm Liver: No focal lesion identified. Generalized hyperechogenicity suggesting some degree of fatty infiltration. Portal vein is patent on color Doppler imaging with normal direction of blood flow towards the liver. Other: None. IMPRESSION: 1. No convincing evidence of acute cholecystitis. No gallstones seen. Questionable gallbladder wall thickening at  the fundus. If localizable RIGHT upper quadrant pain persists or worsens, consider nuclear medicine HIDA scan for further characterization. 2. No bile duct dilatation. 3. Probable fatty infiltration of the liver. Electronically Signed   By: Franki Cabot M.D.   On: 07/17/2021 14:32      Assessment/Plan RUQ abdominal pain with nausea and vomiting - CT AP today with possible R PNA and small right pleural effusion, 7 cm R adnexal cystic mass, 9 cm solid left adnexal mass, thickened endometrium, 2 cm R renal angiomyolipoma - RUQ [place] with no gallstones, questionable gallbladder wall thickening, no pericholecystic fluid and no sonographic Murphy sign - WBC 25.5 and pt febrile to 101.7 - Tbili 1.8, no elevation in AST/ALT or Alk Phos  - abdominal exam overall benign, negative murphys sign.  - given RUQ abd pain, ?GB thickening on imaging, elevated bilirubin, and leukocytosis, will order HIDA scan to evaluate for cholecystitis.   FEN: NPO for HIDA scan, IVF VTE: SCD's  ID: vanc/zosyn given in ED, would continue broad spectrum abx for possible pneumonia and cholecystitis   ?possible PNA ?possible GYN malignancy - recommend GYN vs GYN ONC consult  HTN T2DM   Obie Dredge, [facility] 07/17/2021, 3:36 PM Please see Amion for pager number during day hours 7:00am-4:30pm

## 2021-07-17 NOTE — Progress Notes (Signed)
Elink following code sepsis °

## 2021-07-17 NOTE — Progress Notes (Signed)
Patient arrived to unit via ED. Patient is Aox4, on 3L [place].CHG anf focus assessment completed.   Patient belongings included night gown, robe, and shoes.   Patient oriented to room and unit, bed in lowest position, call bell in reach and bed alarm on.

## 2021-07-17 NOTE — H&P (Signed)
History and Physical    Jade Sanchez EUM:[id number] DOB: August 11, 1941 DOA: 07/17/2021  PCP: Lavone Orn, MD Consultants:  none  Patient coming from:  Home - lives alone   Chief Complaint: RUQ pain and vomiting.   HPI: Jade Sanchez is a 80 y.o. female with medical history significant of HTN and T2DM, HLD, CKD stage IIIa, GERD, primary hyperparathyroidism who presented to ED with RUQ pain and fever. Her daughter is present for history. She started to have vomiting yesterday and RUQ pain. She had a total of 3 episodes of vomiting. Pain in RUQ described as sharp and rated as a 9/10 with no radiation. Pain is constant and unsure if worse with food. Has not been able to eat. Okay with liquids. She has had no diarrhea and no episodes of vomiting.   She has had chills. Mild headache, no chest pain or palpitations, no shortness of breath or cough, no lower abdominal pain or back pain, no dysuria, no vaginal bleeding.   ED Course: vitals: temperature max: 101.7, bp: 134/79, HR: 92, RR: 25, oxygen 96% on room air.  Pertinent labs: WBC 25.5, sodium: 132, glucose: 160, creatinine: 1.26, total bilirubin: 1.8, lipase 33, lactic acid: 1.4>1.5, blood cx pending, urine cx ordered,  [place] RUQ: No convincing evidence of acute cholecystitis. No gallstones seen. Questionable gallbladder wall thickening at the fundus. If localizable RIGHT upper quadrant pain persists or worsens, consider nuclear medicine HIDA scan for further characterization. CT abdomen/pelvis: dense right middle lobe atelectasis vs. Pneumonia, small right pleural effusion, 7.0 cm cystic right adnexal mass and 9.1 cm solid left adnexal mass. Thickened, heterogeneous endometrium, suspicious for endometrial CA.  In ED: started on vanc/zosyn. Bolused 2L and started on maintenance fluids, given zofran, dilaudid (bp/oxygen dropped initially after given), and general surgery consulted. TRH was asked to admit.   Review of  Systems: As per HPI; otherwise review of systems reviewed and negative.     Past Medical History:  Diagnosis Date   Diabetes mellitus without complication ([place])    Hypertension     Past Surgical History:  Procedure Laterality Date   BREAST BIOPSY Bilateral 1989   benign   BREAST EXCISIONAL BIOPSY Bilateral 1989   benign    Social History   Socioeconomic History   Marital status: Divorced    Spouse name: Not on file   Number of children: Not on file   Years of education: Not on file   Highest education level: Not on file  Occupational History   Not on file  Tobacco Use   Smoking status: Not on file   Smokeless tobacco: Not on file  Substance and Sexual Activity   Alcohol use: Not on file   Drug use: Not on file   Sexual activity: Not on file  Other Topics Concern   Not on file  Social History Narrative   Not on file   Social Determinants of Health   Financial Resource Strain: Not on file  Food Insecurity: Not on file  Transportation Needs: Not on file  Physical Activity: Not on file  Stress: Not on file  Social Connections: Not on file  Intimate Partner Violence: Not on file    No Known Allergies  No family history on file.  Prior to Admission medications   Not on File    Physical Exam: Vitals:   07/17/21 1415 07/17/21 1445 07/17/21 1515 07/17/21 1530  BP: 139/63 (!) 84/64 (!) 114/46 (!) 106/46  Pulse: (!) 47 86 84  Resp: (!) 24 19 16  (!) 25  Temp:    99.1 F (37.3 C)  TempSrc:    Oral  SpO2: 96% 95% 96% 95%     General:  Appears calm, uncomfortable and is in NAD. Drowsy  Eyes:  PERRL, EOMI, normal lids, iris ENT:  grossly normal hearing, lips & tongue, dry mucous membranes; appropriate dentition Neck:  no LAD, masses or thyromegaly; no carotid bruits Cardiovascular:  RRR, no m/r/g. Bilateral LE edema 2+ pitting  Respiratory:   bibasilar crackles. No wheezing, normal effort Abdomen:  obese, TTP in RUQ, negative murphy's. Pain more under  right breast.  Back:   normal alignment, no CVAT Skin:  no rash or induration seen on limited exam Musculoskeletal:  grossly normal tone BUE/BLE, good ROM, no bony abnormality Lower extremity:   Limited foot exam with no ulcerations.  2+ distal pulses. Psychiatric:  grossly normal mood and affect, speech fluent and appropriate, AOx3 Neurologic:  CN 2-12 grossly intact, moves all extremities in coordinated fashion, sensation intact    Radiological Exams on Admission: Independently reviewed - see discussion in A/P where applicable  CT ABDOMEN PELVIS W CONTRAST  Result Date: 07/17/2021 CLINICAL DATA:  Constant severe right upper quadrant abdominal pain since yesterday. Fever. Intermittent vomiting. EXAM: CT ABDOMEN AND PELVIS WITH CONTRAST TECHNIQUE: Multidetector CT imaging of the abdomen and pelvis was performed using the standard protocol following bolus administration of intravenous contrast. CONTRAST:  49mL OMNIPAQUE IOHEXOL 300 MG/ML  SOLN COMPARISON:  None. FINDINGS: Lower chest: Dense consolidation in the posterior right middle lobe with small air bronchograms. Mild patchy and linear atelectasis in the right lower lobe. Small right pleural effusion. Mild atelectasis at the left lung base. Hepatobiliary: Borderline dilated gallbladder, measuring 10.3 cm in length. No visible gallstones. No gallbladder wall thickening or pericholecystic fluid. Two small cysts in the left lobe of the liver. Pancreas: Unremarkable. No pancreatic ductal dilatation or surrounding inflammatory changes. Spleen: Normal in size without focal abnormality. Adrenals/Urinary Tract: 2.1 cm homogeneously fat density mass in the lateral right kidney. Unremarkable left kidney, ureters, urinary bladder and adrenal glands. Stomach/Bowel: Multiple sigmoid and distal descending colon diverticula without evidence of diverticulitis. Fluid in the right colon without wall thickening. Unremarkable stomach, small bowel and appendix.  Vascular/Lymphatic: Atheromatous arterial calcifications without aneurysm. No enlarged lymph nodes. Reproductive: Normal sized uterus containing a 1.4 cm anterior exophytic densely calcified fibroid. Heterogeneous low density in the endometrial cavity measuring 3.4 x 2.3 cm on image number 74/3. Cystic mass posterior to the uterus on the right which without supper identification of the right ovary. This measures 7.0 x 4.2 cm on image number 75/3. Heterogeneous solid mass posterior to the uterus on the left without separate identification of the left ovary. This mass measures 9.1 x 7.8 cm on image number 74/3. Other: Small umbilical hernia containing fat. No free peritoneal air seen. Minimal free fluid in the left posterior para colonic gutter. Musculoskeletal: Lumbar and lower thoracic spine degenerative changes with associated grade 1 anterolisthesis at the L4-5 level. No pars defects are seen. Mild scoliosis. IMPRESSION: 1. Dense right middle lobe atelectasis or pneumonia. 2. Small right pleural effusion. 3. Mild bibasilar atelectasis. 4. 7.0 cm cystic right adnexal mass and 9.1 cm solid left adnexal mass. These could be further evaluated with elective pelvic ultrasound or MRI of the pelvis. 5. Thickened, heterogeneous endometrium, suspicious for endometrial carcinoma. 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 7. Sigmoid and distal descending colon diverticulosis. Electronically Signed   By: Remo Lipps  Joneen Caraway M.D.   On: 07/17/2021 13:57   US Abdomen Limited  Result Date: 07/17/2021 CLINICAL DATA:  RIGHT upper quadrant pain. EXAM: ULTRASOUND ABDOMEN LIMITED RIGHT UPPER QUADRANT COMPARISON:  CT abdomen from earlier same day. FINDINGS: Gallbladder: Gallbladder is distended. Portions of the gallbladder are suboptimally visualized. Questionable gallbladder wall thickening at the fundus. No pericholecystic fluid. No sonographic Murphy's sign elicited during the exam, per the sonographer. Common bile duct: Diameter: 4 mm  Liver: No focal lesion identified. Generalized hyperechogenicity suggesting some degree of fatty infiltration. Portal vein is patent on color Doppler imaging with normal direction of blood flow towards the liver. Other: None. IMPRESSION: 1. No convincing evidence of acute cholecystitis. No gallstones seen. Questionable gallbladder wall thickening at the fundus. If localizable RIGHT upper quadrant pain persists or worsens, consider nuclear medicine HIDA scan for further characterization. 2. No bile duct dilatation. 3. Probable fatty infiltration of the liver. Electronically Signed   By: Franki Cabot M.D.   On: 07/17/2021 14:32    EKG: pending    Labs on Admission: I have personally reviewed the available labs and imaging studies at the time of the admission.  Pertinent labs:  WBC 25.5,  sodium: 132,  glucose: 160,  creatinine: 1.26, (1.05-1.20)  total bilirubin: 1.8,  lipase 33,  lactic acid: 1.4>1.5,   Assessment/Plan Principal Problem:   Sepsis in setting of RUQ pain and possible pneumonia vs. Atelectasis in right middle lobe  -80 year old female presenting with fever, RUQ pain and vomiting with sepsis by WBC to 25.2, tachycardia and hypotension. Had some drop in blood pressure after dilaudid, but has remained low. With no additional pain meds.  -admit to progressive -aggressive IVF-given 2L bolus in ED. Gave 500 additional + maintenance IVF  -cultures pending  -started on vancomycin and zosyn, will continue this -lactic acid wnl -CXR now  -checking procalcitonin and respiratory panel -general surgery consulted for possible thickened gall bladder wall. HIDA scan for tomorrow. NPO and no narcotics starting at 2AM.  -respiratory PCR panel  -IS to bedside  -BP still trending low despite aggressive IVF, PCCM consulted.   Active Problems:   RUQ pain -negative murphy sign. Pain more under right breast. ? More possible pneumonia.  -CXR now  -see above     Elevated bilirubin -has not  eaten, could be physiological-gilberts syndrome -no bile duct dilatation, LFTs wnl.  -continue to trend, HIDA tomorrow     Hyponatremia -possibly hypovolemic hyponatremia, concern for malignancy as well -bolused 2.5L then 185ml + maintenance fluids.  -will check urine studies and trend     Pelvic mass-bilateral ovaries and thickened endometrial lining.  Concern for malignancy. Attempted pelvic ultrasound, but due to body habitus unable to be performed. Needs gyn-onc referral and MRI pelvis when more stable   HTN -hold lasix and micardis in setting of hypotension  Hyperlipidemia Continue crestor   T2DM Appears to be diet controlled A1c 08/2020: 6.7 SSI and accuchecks per protocol   CKD stage 3a -above baseline/cusp of AKI -had aggressive IVF resuscitation -likely pre renal in setting of poor po intake, vomiting and hypotension/sepsis -hold nephrotoxic drugs: lasix/micardis -intake/output -follow bmp   There is no height or weight on file to calculate BMI.     Level of care: Telemetry Medical DVT prophylaxis:  Lovenox  Code Status:  Full - confirmed with patient/ Family Communication: daughter at bedside: [name]  Disposition Plan:  The patient is from: home  Anticipated d/c is to: per day team  Requires  inpatient hospitalization with anticipation of > 2 midnight stay due to sepsis of unknown source, need for IVF, IV medication, imaging and is at significant risk of worsening, requires constant monitoring, assessment and MDM with specialists.    Patient is currently: acutely ill Consults called: general surgery/PCCM Admission status:  inpatient    Orma Flaming MD [facility]   How to contact the [facility] Attending or Consulting provider [place] or covering provider during after hours [place], for this patient?  Check the care team in [facility] and look for a) attending/consulting [facility] provider listed and b) the [facility] team listed Log into [email or web address] and use [facility]'s universal password to access. If you do not have the password, please contact the hospital operator. Locate the [facility] provider you are looking for under [facility] and page to a number that you can be directly reached. If you still have difficulty reaching the provider, please page the [facility] (Director on Call) for the Hospitalists listed on amion for assistance.   07/17/2021, 5:06 PM

## 2021-07-17 NOTE — ED Provider Notes (Signed)
Emergency Medicine Provider Triage Evaluation Note  Jade Sanchez , a 80 y.o. female  was evaluated in triage.  Pt complains of RUQ pain. Vomiting since Sunday night with RUQ pain. Pain does not radiate, nothing makes pain worse. No fevers. Prior laparoscopic surgery. Still has gallbladder.   Review of Systems  Positive: RUQ pain, nausea, vomiting  Negative:  Changes in bowel or bladder habits   Physical Exam  There were no vitals taken for this visit. Gen:   Awake, appears uncomfortable   Resp:  Normal effort  MSK:   Moves extremities without difficulty  Other:  RUQ TTP  Medical Decision Making  Medically screening exam initiated at 10:50 AM.  Appropriate orders placed.  Jade Sanchez was informed that the remainder of the evaluation will be completed by another provider, this initial triage assessment does not replace that evaluation, and the importance of remaining in the ED until their evaluation is complete.     Tacy Learn, PA-C 07/17/21 1051    Regan Lemming, MD 07/17/21 [phone]

## 2021-07-18 ENCOUNTER — Inpatient Hospital Stay (HOSPITAL_COMMUNITY): Payer: Medicare Other

## 2021-07-18 DIAGNOSIS — R17 Unspecified jaundice: Secondary | ICD-10-CM | POA: Diagnosis not present

## 2021-07-18 DIAGNOSIS — N1831 Chronic kidney disease, stage 3a: Secondary | ICD-10-CM

## 2021-07-18 DIAGNOSIS — A419 Sepsis, unspecified organism: Secondary | ICD-10-CM | POA: Diagnosis not present

## 2021-07-18 LAB — CBC
HCT: 35 % — ABNORMAL LOW (ref 36.0–46.0)
Hemoglobin: 11.5 g/dL — ABNORMAL LOW (ref 12.0–15.0)
MCH: 31 pg (ref 26.0–34.0)
MCHC: 32.9 g/dL (ref 30.0–36.0)
MCV: 94.3 fL (ref 80.0–100.0)
Platelets: 214 10*3/uL (ref 150–400)
RBC: 3.71 MIL/uL — ABNORMAL LOW (ref 3.87–5.11)
RDW: 13.9 % (ref 11.5–15.5)
WBC: 20 10*3/uL — ABNORMAL HIGH (ref 4.0–10.5)
nRBC: 0 % (ref 0.0–0.2)

## 2021-07-18 LAB — COMPREHENSIVE METABOLIC PANEL
ALT: 19 U/L (ref 0–44)
AST: 21 U/L (ref 15–41)
Albumin: 2.7 g/dL — ABNORMAL LOW (ref 3.5–5.0)
Alkaline Phosphatase: 48 U/L (ref 38–126)
Anion gap: 7 (ref 5–15)
BUN: 18 mg/dL (ref 8–23)
CO2: 24 mmol/L (ref 22–32)
Calcium: 8.3 mg/dL — ABNORMAL LOW (ref 8.9–10.3)
Chloride: 104 mmol/L (ref 98–111)
Creatinine, Ser: 1.34 mg/dL — ABNORMAL HIGH (ref 0.44–1.00)
GFR, Estimated: 41 mL/min — ABNORMAL LOW (ref >60)
Glucose, Bld: 132 mg/dL — ABNORMAL HIGH (ref 70–99)
Potassium: 3.7 mmol/L (ref 3.5–5.1)
Sodium: 135 mmol/L (ref 135–145)
Total Bilirubin: 1.2 mg/dL (ref 0.3–1.2)
Total Protein: 5.8 g/dL — ABNORMAL LOW (ref 6.5–8.1)

## 2021-07-18 LAB — RESPIRATORY PANEL BY PCR

## 2021-07-18 LAB — GLUCOSE, CAPILLARY
Glucose-Capillary: 137 mg/dL — ABNORMAL HIGH (ref 70–99)
Glucose-Capillary: 119 mg/dL — ABNORMAL HIGH (ref 70–99)
Glucose-Capillary: 121 mg/dL — ABNORMAL HIGH (ref 70–99)
Glucose-Capillary: 121 mg/dL — ABNORMAL HIGH (ref 70–99)

## 2021-07-18 LAB — MRSA NEXT GEN BY PCR, NASAL: MRSA by PCR Next Gen: NOT DETECTED

## 2021-07-18 LAB — HEMOGLOBIN A1C
Hgb A1c MFr Bld: 6.8 % — ABNORMAL HIGH (ref 4.8–5.6)
Mean Plasma Glucose: 148 mg/dL

## 2021-07-18 LAB — PROCALCITONIN: Procalcitonin: 0.79 ng/mL

## 2021-07-18 MED ORDER — TECHNETIUM TC 99M MEBROFENIN IV KIT
5.0000 | PACK | Freq: Once | INTRAVENOUS | Status: AC | PRN
  Administered 2021-07-18: 5 via INTRAVENOUS

## 2021-07-18 MED ORDER — HYDROMORPHONE HCL 1 MG/ML IJ SOLN
1.0000 mg | INTRAMUSCULAR | Status: DC | PRN
  Administered 2021-07-18 – 2021-07-19 (×4): 1 mg via INTRAVENOUS
  Filled 2021-07-18: qty 1

## 2021-07-18 MED ORDER — HYDROMORPHONE HCL 1 MG/ML IJ SOLN
INTRAMUSCULAR | Status: AC
  Filled 2021-07-18: qty 1

## 2021-07-18 NOTE — Progress Notes (Addendum)
PROGRESS NOTE    Charnee Turnipseed Newmann  URK:[id number] DOB: 05/09/1941 DOA: 07/17/2021 PCP: Lavone Orn, MD  Brief Narrative: 80/F with history of type 2 diabetes mellitus, hypertension, dyslipidemia, CKD 3a, primary hyperparathyroidism presented to the ED with severe right upper quadrant pain nausea vomiting and fever, symptoms ongoing for 2 days Work-up in the ED noted WBC of 25.5, T-max of 101.7, lipase of 33, total bili of 1.8, CT abdomen noted gallbladder wall thickening, right middle/lower lobe atelectasis with small pleural effusion and cystic adnexal masses bilaterally and heterogeneous endometrial thickening  Assessment & Plan:   Fever, RUQ pain Sepsis, POA -CT abdomen noted borderline dilated gallbladder only and additional findings in the adnexa and endometrium, ultrasound noted  ? Gallbladder wall thickening at the fundus -HIDA scan completed this morning noted patent cystic and bile ducts, GB EF unknown -On exam has significant right upper quadrant tenderness, clinically suspect acute cholecystitis -She has no symptoms of pneumonia, suspect right lung atelectasis and small pleural effusion is reactive -Continue IV Zosyn, she remains n.p.o., general surgery following -Continue IV fluids today -CBC, CMP in a.m.  Bilateral adnexal cystic masses and thickened endometrial lining -CT findings concerning for GYN malignancy, pelvic ultrasound could not be performed yesterday -Has no symptoms secondary to this, will need gynecology evaluation once acute issues have resolved  Type 2 diabetes mellitus -Diet controlled, last A1c was 6.7, continue sliding scale insulin  CKD 3a -Creatinine stable, continue IV fluids today  Hypertension -Lasix and Micardis on hold in the setting of soft BPs  DVT prophylaxis: Lovenox Code Status: Full code Family Communication: Discussed with patient and son at bedside Disposition Plan:  Status is: Inpatient  Remains inpatient appropriate  because: Severity of illness   Consultants:  General surgery  Procedures:   Antimicrobials:    Subjective: -Continues to have severe right upper quadrant abdominal pain  Objective: Vitals:   07/17/21 2145 07/17/21 2239 07/17/21 2354 07/18/21 0312  BP: (!) 118/34 (!) 109/58 (!) 126/40 (!) 114/33  Pulse: 80 79  72  Resp: 19 (!) 22  20  Temp:  98.5 F (36.9 C)  98.6 F (37 C)  TempSrc:  Oral  Oral  SpO2: 99% 96%  92%  Weight:        Intake/Output Summary (Last 24 hours) at 07/18/2021 1031 Last data filed at 07/18/2021 0316 Gross per 24 hour  Intake 3608.75 ml  Output 325 ml  Net 3283.75 ml   Filed Weights   07/17/21 2100  Weight: 114.3 kg    Examination:  General exam: Ill female laying in bed, uncomfortable appearing, AAOx3 CVS: S1-S2, tachycardic Lungs: Decreased breath sounds to bases Abdomen: Severe right upper quadrant tenderness, rigidity and guarding, bowel sounds decreased Extremities: No edema Skin: No rash on exposed skin  Data Reviewed:   CBC: Recent Labs  Lab 07/17/21 1101 07/18/21 0300  WBC 25.5* 20.0*  NEUTROABS 23.2*  --   HGB 13.5 11.5*  HCT 40.9 35.0*  MCV 93.8 94.3  PLT 240 831   Basic Metabolic Panel: Recent Labs  Lab 07/17/21 1101 07/18/21 0300  NA 132* 135  K 3.9 3.7  CL 100 104  CO2 24 24  GLUCOSE 160* 132*  BUN 16 18  CREATININE 1.26* 1.34*  CALCIUM 9.6 8.3*   GFR: CrCl cannot be calculated (Unknown ideal weight.). Liver Function Tests: Recent Labs  Lab 07/17/21 1101 07/18/21 0300  AST 21 21  ALT 19 19  ALKPHOS 55 48  BILITOT 1.8* 1.2  PROT  7.2 5.8*  ALBUMIN 3.4* 2.7*   Recent Labs  Lab 07/17/21 1101  LIPASE 33   No results for input(s): AMMONIA in the last 168 hours. Coagulation Profile: No results for input(s): INR, PROTIME in the last 168 hours. Cardiac Enzymes: No results for input(s): CKTOTAL, CKMB, CKMBINDEX, TROPONINI in the last 168 hours. BNP (last 3 results) No results for input(s):  PROBNP in the last 8760 hours. HbA1C: No results for input(s): HGBA1C in the last 72 hours. CBG: Recent Labs  Lab 07/17/21 2246 07/18/21 0706  GLUCAP 132* 137*   Lipid Profile: No results for input(s): CHOL, HDL, LDLCALC, TRIG, CHOLHDL, LDLDIRECT in the last 72 hours. Thyroid Function Tests: No results for input(s): TSH, T4TOTAL, FREET4, T3FREE, THYROIDAB in the last 72 hours. Anemia Panel: No results for input(s): VITAMINB12, FOLATE, FERRITIN, TIBC, IRON, RETICCTPCT in the last 72 hours. Urine analysis:    Component Value Date/Time   COLORURINE AMBER (A) 07/17/2021 1050   APPEARANCEUR CLEAR 07/17/2021 1050   LABSPEC 1.025 07/17/2021 1050   PHURINE 5.5 07/17/2021 1050   GLUCOSEU NEGATIVE 07/17/2021 1050   HGBUR TRACE (A) 07/17/2021 1050   BILIRUBINUR NEGATIVE 07/17/2021 1050   KETONESUR NEGATIVE 07/17/2021 1050   PROTEINUR NEGATIVE 07/17/2021 1050   NITRITE NEGATIVE 07/17/2021 1050   LEUKOCYTESUR NEGATIVE 07/17/2021 1050   Sepsis Labs: @LABRCNTIP (procalcitonin:4,lacticidven:4)  ) Recent Results (from the past 240 hour(s))  Blood culture (routine x 2)     Status: None (Preliminary result)   Collection Time: 07/17/21 12:30 PM   Specimen: BLOOD RIGHT ARM  Result Value Ref Range Status   Specimen Description BLOOD RIGHT ARM  Final   Special Requests   Final    BOTTLES DRAWN AEROBIC AND ANAEROBIC Blood Culture adequate volume   Culture   Final    NO GROWTH < 24 HOURS Performed at [facility], [place] [address] [place], [place] [address]    Report Status PENDING  Incomplete  Blood culture (routine x 2)     Status: None (Preliminary result)   Collection Time: 07/17/21 12:41 PM   Specimen: BLOOD  Result Value Ref Range Status   Specimen Description BLOOD SITE NOT SPECIFIED  Final   Special Requests   Final    BOTTLES DRAWN AEROBIC AND ANAEROBIC Blood Culture adequate volume   Culture   Final    NO GROWTH < 24 HOURS Performed at [facility], [place]  [address], [place], [place] [address]    Report Status PENDING  Incomplete  Resp Panel by RT-PCR (Flu A&B, Covid) Nasopharyngeal Swab     Status: None   Collection Time: 07/17/21  3:35 PM   Specimen: Nasopharyngeal Swab; Nasopharyngeal(NP) swabs in vial transport medium  Result Value Ref Range Status   SARS Coronavirus 2 by RT PCR NEGATIVE NEGATIVE Final    Comment: (NOTE) SARS-CoV-2 target nucleic acids are NOT DETECTED.  The SARS-CoV-2 RNA is generally detectable in upper respiratory specimens during the acute phase of infection. The lowest concentration of SARS-CoV-2 viral copies this assay can detect is 138 copies/mL. A negative result does not preclude SARS-Cov-2 infection and should not be used as the sole basis for treatment or other patient management decisions. A negative result may occur with  improper specimen collection/handling, submission of specimen other than nasopharyngeal swab, presence of viral mutation(s) within the areas targeted by this assay, and inadequate number of viral copies(<138 copies/mL). A negative result must be combined with clinical observations, patient history, and epidemiological information. The  expected result is Negative.  Fact Sheet for Patients:  EntrepreneurPulse.com.au  Fact Sheet for Healthcare Providers:  IncredibleEmployment.be  This test is no t yet approved or cleared by the [place] FDA and  has been authorized for detection and/or diagnosis of SARS-CoV-2 by FDA under an Emergency Use Authorization (EUA). This EUA will remain  in effect (meaning this test can be used) for the duration of the COVID-19 declaration under Section 564(b)(1) of the Act, 21 U.S.C.section 360bbb-3(b)(1), unless the authorization is terminated  or revoked sooner.       Influenza A by PCR NEGATIVE NEGATIVE Final   Influenza B by PCR NEGATIVE NEGATIVE Final    Comment: (NOTE) The Xpert Xpress SARS-CoV-2/FLU/RSV plus  assay is intended as an aid in the diagnosis of influenza from Nasopharyngeal swab specimens and should not be used as a sole basis for treatment. Nasal washings and aspirates are unacceptable for Xpert Xpress SARS-CoV-2/FLU/RSV testing.  Fact Sheet for Patients: EntrepreneurPulse.com.au  Fact Sheet for Healthcare Providers: IncredibleEmployment.be  This test is not yet approved or cleared by the [place] FDA and has been authorized for detection and/or diagnosis of SARS-CoV-2 by FDA under an Emergency Use Authorization (EUA). This EUA will remain in effect (meaning this test can be used) for the duration of the COVID-19 declaration under Section 564(b)(1) of the Act, 21 U.S.C. section 360bbb-3(b)(1), unless the authorization is terminated or revoked.  Performed at [facility], [place] [address] [place], [place] [address]   Respiratory (~20 pathogens) panel by PCR     Status: None   Collection Time: 07/17/21  9:52 PM   Specimen: Nasopharyngeal Swab; Respiratory  Result Value Ref Range Status   Adenovirus NOT DETECTED NOT DETECTED Final   Coronavirus 229E NOT DETECTED NOT DETECTED Final    Comment: (NOTE) The Coronavirus on the Respiratory Panel, DOES NOT test for the novel  Coronavirus (2019 nCoV)    Coronavirus HKU1 NOT DETECTED NOT DETECTED Final   Coronavirus NL63 NOT DETECTED NOT DETECTED Final   Coronavirus OC43 NOT DETECTED NOT DETECTED Final   Metapneumovirus NOT DETECTED NOT DETECTED Final   Rhinovirus / Enterovirus NOT DETECTED NOT DETECTED Final   Influenza A NOT DETECTED NOT DETECTED Final   Influenza B NOT DETECTED NOT DETECTED Final   Parainfluenza Virus 1 NOT DETECTED NOT DETECTED Final   Parainfluenza Virus 2 NOT DETECTED NOT DETECTED Final   Parainfluenza Virus 3 NOT DETECTED NOT DETECTED Final   Parainfluenza Virus 4 NOT DETECTED NOT DETECTED Final   Respiratory Syncytial Virus NOT DETECTED NOT DETECTED Final    Bordetella pertussis NOT DETECTED NOT DETECTED Final   Bordetella Parapertussis NOT DETECTED NOT DETECTED Final   Chlamydophila pneumoniae NOT DETECTED NOT DETECTED Final   Mycoplasma pneumoniae NOT DETECTED NOT DETECTED Final    Comment: Performed at [facility], [place] [address] [place], [place] [address]  MRSA Next Gen by PCR, Nasal     Status: None   Collection Time: 07/17/21 11:05 PM   Specimen: Nasal Mucosa; Nasal Swab  Result Value Ref Range Status   MRSA by PCR Next Gen NOT DETECTED NOT DETECTED Final    Comment: (NOTE) The GeneXpert MRSA Assay (FDA approved for NASAL specimens only), is one component of a comprehensive MRSA colonization surveillance program. It is not intended to diagnose MRSA infection nor to guide or monitor treatment for MRSA infections. Test performance is not FDA approved in patients less than 17 years old. Performed at [facility], [place] [address] [place], [place] [address]          Radiology Studies: NM Hepatobiliary Liver Func  Result Date: 07/18/2021 CLINICAL DATA:  Abdominal pain. EXAM: NUCLEAR MEDICINE HEPATOBILIARY IMAGING TECHNIQUE: Sequential images of the abdomen were obtained out to 60 minutes following intravenous administration of radiopharmaceutical. RADIOPHARMACEUTICALS:  5.3 mCi Tc-62m  Choletec IV COMPARISON:  CT abdomen pelvis, 07/17/2021 FINDINGS: Prompt uptake and biliary excretion of activity by the liver is seen. Gallbladder activity is visualized, consistent with patency of cystic duct. Biliary activity passes into small bowel, consistent with patent common bile duct. IMPRESSION: 1.  The cystic and common bile ducts are patent. 2. Gallbladder ejection fraction was not assessed on this examination. Please note that gallbladder ejection fraction measurement is not reliable or validated in the acute inpatient setting and assessment for gallbladder dysmotility should generally be deferred for outpatient evaluation.  Electronically Signed   By: Delanna Ahmadi M.D.   On: 07/18/2021 09:14   US PELVIS LIMITED (TRANSABDOMINAL ONLY)  Result Date: 07/17/2021 CLINICAL DATA:  Abnormal CT. EXAM: TRANSABDOMINAL ULTRASOUND OF PELVIS TECHNIQUE: Transabdominal ultrasound examination of the pelvis was performed including evaluation of the uterus, ovaries, adnexal regions, and pelvic cul-de-sac. COMPARISON:  Abdominal ultrasound and CT abdomen and pelvis 07/17/2021. FINDINGS: Uterus Measurements: 8.1 x 4.0 x 4.9 cm = volume: 82 mL. 1.8 x 0.7 x 1.9 cm calcified exophytic fibroid. Myometrium otherwise within normal limits. Endometrium Thickness: 9 mm.  Difficult to visualize.  Appears echogenic. Right ovary Not visualized. Left ovary There is a 5.7 x 6.4 x 7.2 cm mass in the left adnexa. This is slightly hypoechoic. Separate normal appearing left ovary is not visualized. Other findings:  No abnormal free fluid. IMPRESSION: 1. Technically limited study. 2. Indeterminate left adnexal mass measuring 7.2 cm may be related to ovarian malignancy or other ovarian tumor. 3. Endometrium appears thickened and echogenic. Differential diagnosis includes endometrial hyperplasia, polyp or carcinoma. 4. Right ovary not visualized. 5. Small calcified uterine fibroid. Electronically Signed   By: Ronney Asters M.D.   On: 07/17/2021 17:23   CT ABDOMEN PELVIS W CONTRAST  Result Date: 07/17/2021 CLINICAL DATA:  Constant severe right upper quadrant abdominal pain since yesterday. Fever. Intermittent vomiting. EXAM: CT ABDOMEN AND PELVIS WITH CONTRAST TECHNIQUE: Multidetector CT imaging of the abdomen and pelvis was performed using the standard protocol following bolus administration of intravenous contrast. CONTRAST:  81mL OMNIPAQUE IOHEXOL 300 MG/ML  SOLN COMPARISON:  None. FINDINGS: Lower chest: Dense consolidation in the posterior right middle lobe with small air bronchograms. Mild patchy and linear atelectasis in the right lower lobe. Small right pleural  effusion. Mild atelectasis at the left lung base. Hepatobiliary: Borderline dilated gallbladder, measuring 10.3 cm in length. No visible gallstones. No gallbladder wall thickening or pericholecystic fluid. Two small cysts in the left lobe of the liver. Pancreas: Unremarkable. No pancreatic ductal dilatation or surrounding inflammatory changes. Spleen: Normal in size without focal abnormality. Adrenals/Urinary Tract: 2.1 cm homogeneously fat density mass in the lateral right kidney. Unremarkable left kidney, ureters, urinary bladder and adrenal glands. Stomach/Bowel: Multiple sigmoid and distal descending colon diverticula without evidence of diverticulitis. Fluid in the right colon without wall thickening. Unremarkable stomach, small bowel and appendix. Vascular/Lymphatic: Atheromatous arterial calcifications without aneurysm. No enlarged lymph nodes. Reproductive: Normal sized uterus containing a 1.4 cm anterior exophytic densely calcified fibroid. Heterogeneous low density in the endometrial cavity measuring 3.4 x 2.3 cm on image number 74/3. Cystic mass posterior to the uterus  on the right which without supper identification of the right ovary. This measures 7.0 x 4.2 cm on image number 75/3. Heterogeneous solid mass posterior to the uterus on the left without separate identification of the left ovary. This mass measures 9.1 x 7.8 cm on image number 74/3. Other: Small umbilical hernia containing fat. No free peritoneal air seen. Minimal free fluid in the left posterior para colonic gutter. Musculoskeletal: Lumbar and lower thoracic spine degenerative changes with associated grade 1 anterolisthesis at the L4-5 level. No pars defects are seen. Mild scoliosis. IMPRESSION: 1. Dense right middle lobe atelectasis or pneumonia. 2. Small right pleural effusion. 3. Mild bibasilar atelectasis. 4. 7.0 cm cystic right adnexal mass and 9.1 cm solid left adnexal mass. These could be further evaluated with elective pelvic  ultrasound or MRI of the pelvis. 5. Thickened, heterogeneous endometrium, suspicious for endometrial carcinoma. 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 7. Sigmoid and distal descending colon diverticulosis. Electronically Signed   By: Claudie Revering M.D.   On: 07/17/2021 13:57   US Abdomen Limited  Result Date: 07/17/2021 CLINICAL DATA:  RIGHT upper quadrant pain. EXAM: ULTRASOUND ABDOMEN LIMITED RIGHT UPPER QUADRANT COMPARISON:  CT abdomen from earlier same day. FINDINGS: Gallbladder: Gallbladder is distended. Portions of the gallbladder are suboptimally visualized. Questionable gallbladder wall thickening at the fundus. No pericholecystic fluid. No sonographic Murphy's sign elicited during the exam, per the sonographer. Common bile duct: Diameter: 4 mm Liver: No focal lesion identified. Generalized hyperechogenicity suggesting some degree of fatty infiltration. Portal vein is patent on color Doppler imaging with normal direction of blood flow towards the liver. Other: None. IMPRESSION: 1. No convincing evidence of acute cholecystitis. No gallstones seen. Questionable gallbladder wall thickening at the fundus. If localizable RIGHT upper quadrant pain persists or worsens, consider nuclear medicine HIDA scan for further characterization. 2. No bile duct dilatation. 3. Probable fatty infiltration of the liver. Electronically Signed   By: Franki Cabot M.D.   On: 07/17/2021 14:32   DG CHEST PORT 1 VIEW  Result Date: 07/17/2021 CLINICAL DATA:  Right upper quadrant pain EXAM: PORTABLE CHEST 1 VIEW COMPARISON:  02/06/2005, CT 07/17/2021 FINDINGS: Consolidation in the right lung base. No pleural effusion. Cardiomediastinal silhouette within normal limits. Aortic atherosclerosis. No pneumothorax IMPRESSION: Consolidation in the right lung base is suspicious for pneumonia. Radiographic follow-up to resolution is recommended Electronically Signed   By: Donavan Foil M.D.   On: 07/17/2021 23:06        Scheduled  Meds:  enoxaparin (LOVENOX) injection  40 mg Subcutaneous Q24H   insulin aspart  0-15 Units Subcutaneous TID WC   rosuvastatin  5 mg Oral Daily   Continuous Infusions:  sodium chloride 75 mL/hr at 07/17/21 2214   piperacillin-tazobactam (ZOSYN)  IV Stopped (07/18/21 0951)   vancomycin       LOS: 1 day    Time spent: 65min  Domenic Polite, MD [facility]   07/18/2021, 10:31 AM

## 2021-07-18 NOTE — Progress Notes (Signed)
Subjective: CC: Pain in right lower chest/right upper abdomen improved to 3/10. No n/v. Tolerating clears. HIDA negative for acute cholecystitis. Having some sob. O2 sats in 90's. O2 replaced with improvement.   Objective: Vital signs in last 24 hours: Temp:  [98.5 F (36.9 C)-101.7 F (38.7 C)] 98.7 F (37.1 C) (11/29 1035) Pulse Rate:  [47-92] 72 (11/29 0312) Resp:  [14-32] 20 (11/29 0312) BP: (84-152)/(33-79) 114/33 (11/29 0312) SpO2:  [92 %-99 %] 92 % (11/29 0312) Weight:  [114.3 kg] 114.3 kg (11/28 2100) Last BM Date: 07/16/21  Intake/Output from previous day: 11/28 0701 - 11/29 0700 In: 3608.8 [I.V.:708.8; IV Piggyback:2900] Out: 325 [Urine:325] Intake/Output this shift: No intake/output data recorded.  PE: Gen:  Alert, NAD, pleasant HEENT: EOM's intact, pupils equal and round Card:  RRR Pulm:  Tachypneic, on o2.  Abd: Soft, ND, she was NT on my exam throughout the abdomen to light and deep palpation. She said she did have tenderness previously but presently is no longer tender. Negative Murphy's sign. +BS Psych: A&Ox3  Skin: no rashes noted, warm and dry  Lab Results:  Recent Labs    07/17/21 1101 07/18/21 0300  WBC 25.5* 20.0*  HGB 13.5 11.5*  HCT 40.9 35.0*  PLT 240 214   BMET Recent Labs    07/17/21 1101 07/18/21 0300  NA 132* 135  K 3.9 3.7  CL 100 104  CO2 24 24  GLUCOSE 160* 132*  BUN 16 18  CREATININE 1.26* 1.34*  CALCIUM 9.6 8.3*   PT/INR No results for input(s): LABPROT, INR in the last 72 hours. CMP     Component Value Date/Time   NA 135 07/18/2021 0300   K 3.7 07/18/2021 0300   CL 104 07/18/2021 0300   CO2 24 07/18/2021 0300   GLUCOSE 132 (H) 07/18/2021 0300   BUN 18 07/18/2021 0300   CREATININE 1.34 (H) 07/18/2021 0300   CALCIUM 8.3 (L) 07/18/2021 0300   PROT 5.8 (L) 07/18/2021 0300   ALBUMIN 2.7 (L) 07/18/2021 0300   AST 21 07/18/2021 0300   ALT 19 07/18/2021 0300   ALKPHOS 48 07/18/2021 0300   BILITOT 1.2  07/18/2021 0300   GFRNONAA 41 (L) 07/18/2021 0300   Lipase     Component Value Date/Time   LIPASE 33 07/17/2021 1101    Studies/Results: NM Hepatobiliary Liver Func  Result Date: 07/18/2021 CLINICAL DATA:  Abdominal pain. EXAM: NUCLEAR MEDICINE HEPATOBILIARY IMAGING TECHNIQUE: Sequential images of the abdomen were obtained out to 60 minutes following intravenous administration of radiopharmaceutical. RADIOPHARMACEUTICALS:  5.3 mCi Tc-36m  Choletec IV COMPARISON:  CT abdomen pelvis, 07/17/2021 FINDINGS: Prompt uptake and biliary excretion of activity by the liver is seen. Gallbladder activity is visualized, consistent with patency of cystic duct. Biliary activity passes into small bowel, consistent with patent common bile duct. IMPRESSION: 1.  The cystic and common bile ducts are patent. 2. Gallbladder ejection fraction was not assessed on this examination. Please note that gallbladder ejection fraction measurement is not reliable or validated in the acute inpatient setting and assessment for gallbladder dysmotility should generally be deferred for outpatient evaluation. Electronically Signed   By: Delanna Ahmadi M.D.   On: 07/18/2021 09:14   US PELVIS LIMITED (TRANSABDOMINAL ONLY)  Result Date: 07/17/2021 CLINICAL DATA:  Abnormal CT. EXAM: TRANSABDOMINAL ULTRASOUND OF PELVIS TECHNIQUE: Transabdominal ultrasound examination of the pelvis was performed including evaluation of the uterus, ovaries, adnexal regions, and pelvic cul-de-sac. COMPARISON:  Abdominal ultrasound and CT abdomen  and pelvis 07/17/2021. FINDINGS: Uterus Measurements: 8.1 x 4.0 x 4.9 cm = volume: 82 mL. 1.8 x 0.7 x 1.9 cm calcified exophytic fibroid. Myometrium otherwise within normal limits. Endometrium Thickness: 9 mm.  Difficult to visualize.  Appears echogenic. Right ovary Not visualized. Left ovary There is a 5.7 x 6.4 x 7.2 cm mass in the left adnexa. This is slightly hypoechoic. Separate normal appearing left ovary is not  visualized. Other findings:  No abnormal free fluid. IMPRESSION: 1. Technically limited study. 2. Indeterminate left adnexal mass measuring 7.2 cm may be related to ovarian malignancy or other ovarian tumor. 3. Endometrium appears thickened and echogenic. Differential diagnosis includes endometrial hyperplasia, polyp or carcinoma. 4. Right ovary not visualized. 5. Small calcified uterine fibroid. Electronically Signed   By: Ronney Asters M.D.   On: 07/17/2021 17:23   CT ABDOMEN PELVIS W CONTRAST  Result Date: 07/17/2021 CLINICAL DATA:  Constant severe right upper quadrant abdominal pain since yesterday. Fever. Intermittent vomiting. EXAM: CT ABDOMEN AND PELVIS WITH CONTRAST TECHNIQUE: Multidetector CT imaging of the abdomen and pelvis was performed using the standard protocol following bolus administration of intravenous contrast. CONTRAST:  12mL OMNIPAQUE IOHEXOL 300 MG/ML  SOLN COMPARISON:  None. FINDINGS: Lower chest: Dense consolidation in the posterior right middle lobe with small air bronchograms. Mild patchy and linear atelectasis in the right lower lobe. Small right pleural effusion. Mild atelectasis at the left lung base. Hepatobiliary: Borderline dilated gallbladder, measuring 10.3 cm in length. No visible gallstones. No gallbladder wall thickening or pericholecystic fluid. Two small cysts in the left lobe of the liver. Pancreas: Unremarkable. No pancreatic ductal dilatation or surrounding inflammatory changes. Spleen: Normal in size without focal abnormality. Adrenals/Urinary Tract: 2.1 cm homogeneously fat density mass in the lateral right kidney. Unremarkable left kidney, ureters, urinary bladder and adrenal glands. Stomach/Bowel: Multiple sigmoid and distal descending colon diverticula without evidence of diverticulitis. Fluid in the right colon without wall thickening. Unremarkable stomach, small bowel and appendix. Vascular/Lymphatic: Atheromatous arterial calcifications without aneurysm. No  enlarged lymph nodes. Reproductive: Normal sized uterus containing a 1.4 cm anterior exophytic densely calcified fibroid. Heterogeneous low density in the endometrial cavity measuring 3.4 x 2.3 cm on image number 74/3. Cystic mass posterior to the uterus on the right which without supper identification of the right ovary. This measures 7.0 x 4.2 cm on image number 75/3. Heterogeneous solid mass posterior to the uterus on the left without separate identification of the left ovary. This mass measures 9.1 x 7.8 cm on image number 74/3. Other: Small umbilical hernia containing fat. No free peritoneal air seen. Minimal free fluid in the left posterior para colonic gutter. Musculoskeletal: Lumbar and lower thoracic spine degenerative changes with associated grade 1 anterolisthesis at the L4-5 level. No pars defects are seen. Mild scoliosis. IMPRESSION: 1. Dense right middle lobe atelectasis or pneumonia. 2. Small right pleural effusion. 3. Mild bibasilar atelectasis. 4. 7.0 cm cystic right adnexal mass and 9.1 cm solid left adnexal mass. These could be further evaluated with elective pelvic ultrasound or MRI of the pelvis. 5. Thickened, heterogeneous endometrium, suspicious for endometrial carcinoma. 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 7. Sigmoid and distal descending colon diverticulosis. Electronically Signed   By: Claudie Revering M.D.   On: 07/17/2021 13:57   US Abdomen Limited  Result Date: 07/17/2021 CLINICAL DATA:  RIGHT upper quadrant pain. EXAM: ULTRASOUND ABDOMEN LIMITED RIGHT UPPER QUADRANT COMPARISON:  CT abdomen from earlier same day. FINDINGS: Gallbladder: Gallbladder is distended. Portions of the gallbladder are  suboptimally visualized. Questionable gallbladder wall thickening at the fundus. No pericholecystic fluid. No sonographic Murphy's sign elicited during the exam, per the sonographer. Common bile duct: Diameter: 4 mm Liver: No focal lesion identified. Generalized hyperechogenicity suggesting some  degree of fatty infiltration. Portal vein is patent on color Doppler imaging with normal direction of blood flow towards the liver. Other: None. IMPRESSION: 1. No convincing evidence of acute cholecystitis. No gallstones seen. Questionable gallbladder wall thickening at the fundus. If localizable RIGHT upper quadrant pain persists or worsens, consider nuclear medicine HIDA scan for further characterization. 2. No bile duct dilatation. 3. Probable fatty infiltration of the liver. Electronically Signed   By: Franki Cabot M.D.   On: 07/17/2021 14:32   DG CHEST PORT 1 VIEW  Result Date: 07/17/2021 CLINICAL DATA:  Right upper quadrant pain EXAM: PORTABLE CHEST 1 VIEW COMPARISON:  02/06/2005, CT 07/17/2021 FINDINGS: Consolidation in the right lung base. No pleural effusion. Cardiomediastinal silhouette within normal limits. Aortic atherosclerosis. No pneumothorax IMPRESSION: Consolidation in the right lung base is suspicious for pneumonia. Radiographic follow-up to resolution is recommended Electronically Signed   By: Donavan Foil M.D.   On: 07/17/2021 23:06    Anti-infectives: Anti-infectives (From admission, onward)    Start     Dose/Rate Route Frequency Ordered Stop   07/18/21 1600  vancomycin (VANCOREADY) IVPB 1000 mg/200 mL        1,000 mg 200 mL/hr over 60 Minutes Intravenous Every 24 hours 07/17/21 1444     07/17/21 2100  piperacillin-tazobactam (ZOSYN) IVPB 3.375 g        3.375 g 12.5 mL/hr over 240 Minutes Intravenous Every 8 hours 07/17/21 1755     07/17/21 1445  vancomycin (VANCOREADY) IVPB 1750 mg/350 mL        1,750 mg 175 mL/hr over 120 Minutes Intravenous  Once 07/17/21 1444 07/17/21 1732   07/17/21 1200  piperacillin-tazobactam (ZOSYN) IVPB 3.375 g        3.375 g 100 mL/hr over 30 Minutes Intravenous  Once 07/17/21 1159 07/17/21 1312        Assessment/Plan RUQ abdominal pain with nausea and vomiting - RUQ [place] without stones visualized. HIDA scan negative for acute  cholecystitis. No indication for emergency surgery. We will be available as needed moving forward.   FEN: Ok for diet from our standpoint VTE: SCD's, okay for chemical prophylaxis from our standpoint ID: [email or web address]    CT AP with possible R PNA and small right pleural effusion. She is on broad spectrum abx. Pulm following.  CT A/P also with  7 cm R adnexal cystic mass, 9 cm solid left adnexal mass and thickened endometrium - recommend GYN vs GYN Onc eval HTN T2DM   LOS: 1 day    Jillyn Ledger , [facility] Surgery 07/18/2021, 11:31 AM Please see Amion for pager number during day hours 7:00am-4:30pm

## 2021-07-19 ENCOUNTER — Inpatient Hospital Stay (HOSPITAL_COMMUNITY): Payer: Medicare Other

## 2021-07-19 ENCOUNTER — Encounter (HOSPITAL_COMMUNITY): Payer: Self-pay | Admitting: Family Medicine

## 2021-07-19 DIAGNOSIS — N1831 Chronic kidney disease, stage 3a: Secondary | ICD-10-CM | POA: Diagnosis not present

## 2021-07-19 DIAGNOSIS — R17 Unspecified jaundice: Secondary | ICD-10-CM | POA: Diagnosis not present

## 2021-07-19 DIAGNOSIS — A419 Sepsis, unspecified organism: Secondary | ICD-10-CM | POA: Diagnosis not present

## 2021-07-19 LAB — COMPREHENSIVE METABOLIC PANEL
ALT: 28 U/L (ref 0–44)
AST: 29 U/L (ref 15–41)
Albumin: 2.4 g/dL — ABNORMAL LOW (ref 3.5–5.0)
Alkaline Phosphatase: 52 U/L (ref 38–126)
Anion gap: 5 (ref 5–15)
BUN: 20 mg/dL (ref 8–23)
CO2: 24 mmol/L (ref 22–32)
Calcium: 8.4 mg/dL — ABNORMAL LOW (ref 8.9–10.3)
Chloride: 103 mmol/L (ref 98–111)
Creatinine, Ser: 1.28 mg/dL — ABNORMAL HIGH (ref 0.44–1.00)
GFR, Estimated: 43 mL/min — ABNORMAL LOW (ref >60)
Glucose, Bld: 131 mg/dL — ABNORMAL HIGH (ref 70–99)
Potassium: 3.9 mmol/L (ref 3.5–5.1)
Sodium: 132 mmol/L — ABNORMAL LOW (ref 135–145)
Total Bilirubin: 1 mg/dL (ref 0.3–1.2)
Total Protein: 5.7 g/dL — ABNORMAL LOW (ref 6.5–8.1)

## 2021-07-19 LAB — GLUCOSE, CAPILLARY
Glucose-Capillary: 119 mg/dL — ABNORMAL HIGH (ref 70–99)
Glucose-Capillary: 138 mg/dL — ABNORMAL HIGH (ref 70–99)
Glucose-Capillary: 112 mg/dL — ABNORMAL HIGH (ref 70–99)
Glucose-Capillary: 101 mg/dL — ABNORMAL HIGH (ref 70–99)

## 2021-07-19 LAB — CBC
HCT: 33.3 % — ABNORMAL LOW (ref 36.0–46.0)
Hemoglobin: 11 g/dL — ABNORMAL LOW (ref 12.0–15.0)
MCH: 31.5 pg (ref 26.0–34.0)
MCHC: 33 g/dL (ref 30.0–36.0)
MCV: 95.4 fL (ref 80.0–100.0)
Platelets: 244 10*3/uL (ref 150–400)
RBC: 3.49 MIL/uL — ABNORMAL LOW (ref 3.87–5.11)
RDW: 13.8 % (ref 11.5–15.5)
WBC: 14.8 10*3/uL — ABNORMAL HIGH (ref 4.0–10.5)
nRBC: 0 % (ref 0.0–0.2)

## 2021-07-19 LAB — PROCALCITONIN: Procalcitonin: 0.77 ng/mL

## 2021-07-19 MED ORDER — OXYCODONE HCL 5 MG PO TABS
5.0000 mg | ORAL_TABLET | Freq: Four times a day (QID) | ORAL | Status: DC | PRN
  Administered 2021-07-19 – 2021-07-21 (×4): 5 mg via ORAL
  Filled 2021-07-19 (×4): qty 1

## 2021-07-19 MED ORDER — LORAZEPAM 2 MG/ML IJ SOLN
1.0000 mg | Freq: Every day | INTRAMUSCULAR | Status: AC | PRN
  Administered 2021-07-19: 1 mg via INTRAVENOUS
  Filled 2021-07-19: qty 1

## 2021-07-19 MED ORDER — FUROSEMIDE 10 MG/ML IJ SOLN
20.0000 mg | Freq: Once | INTRAMUSCULAR | Status: AC
  Administered 2021-07-19: 20 mg via INTRAVENOUS
  Filled 2021-07-19: qty 2

## 2021-07-19 MED ORDER — GADOBUTROL 1 MMOL/ML IV SOLN
10.0000 mL | Freq: Once | INTRAVENOUS | Status: AC | PRN
  Administered 2021-07-19: 10 mL via INTRAVENOUS

## 2021-07-19 NOTE — Consult Note (Signed)
Reason for Consult: Right upper quadrant pain fever Referring Physician: Hospital team  Jade Sanchez Dorian is an 80 y.o. female.  HPI: Patient seen and examined in our office computer chart and her hospital computer chart reviewed and her case discussed with her son as well as the hospital team and her symptoms started Saturday with vomiting and then she developed right upper quadrant pain and was not aware she had a fever at home but did have chills and so far work-up has been negative for gallstones as a possible cause and her bilirubin was slightly elevated but her other liver tests were normal and she is currently feeling better still has a very dull pain but seem to be there persistently and not worse with breathing or coughing and she has never had it before and she does not have any upper tract symptoms is not on any aspirin or arthritis pills and her mother had gallbladder problems but nobody else has had any gallbladder issues she has no other complaints and we discussed her gynecologic issue as well Past Medical History:  Diagnosis Date   Diabetes mellitus without complication ([place])    Hypertension     Past Surgical History:  Procedure Laterality Date   BREAST BIOPSY Bilateral 1989   benign   BREAST EXCISIONAL BIOPSY Bilateral 1989   benign    History reviewed. No pertinent family history.  Social History:  has no history on file for tobacco use, alcohol use, and drug use.  Allergies: No Known Allergies  Medications: I have reviewed the patient's current medications.  Results for orders placed or performed during the hospital encounter of 07/17/21 (from the past 48 hour(s))  Lactic acid, plasma     Status: None   Collection Time: 07/17/21 12:08 PM  Result Value Ref Range   Lactic Acid, Venous 1.4 0.5 - 1.9 mmol/L    Comment: Performed at [facility], [address] [place], [place] [address]  Blood culture (routine x 2)     Status: None (Preliminary  result)   Collection Time: 07/17/21 12:30 PM   Specimen: BLOOD RIGHT ARM  Result Value Ref Range   Specimen Description BLOOD RIGHT ARM    Special Requests      BOTTLES DRAWN AEROBIC AND ANAEROBIC Blood Culture adequate volume   Culture      NO GROWTH 2 DAYS Performed at [facility], [place] [address], [place], [place] [address]    Report Status PENDING   Blood culture (routine x 2)     Status: None (Preliminary result)   Collection Time: 07/17/21 12:41 PM   Specimen: BLOOD  Result Value Ref Range   Specimen Description BLOOD SITE NOT SPECIFIED    Special Requests      BOTTLES DRAWN AEROBIC AND ANAEROBIC Blood Culture adequate volume   Culture      NO GROWTH 2 DAYS Performed at [facility], [place] [address], [place], [place] [address]    Report Status PENDING   Lactic acid, plasma     Status: None   Collection Time: 07/17/21  1:08 PM  Result Value Ref Range   Lactic Acid, Venous 1.5 0.5 - 1.9 mmol/L    Comment: Performed at [facility] [address] [place], [place] [address]  Resp Panel by RT-PCR (Flu A&B, Covid) Nasopharyngeal Swab     Status: None   Collection Time: 07/17/21  3:35 PM   Specimen: Nasopharyngeal Swab; Nasopharyngeal(NP) swabs in vial transport  medium  Result Value Ref Range   SARS Coronavirus 2 by RT PCR NEGATIVE NEGATIVE    Comment: (NOTE) SARS-CoV-2 target nucleic acids are NOT DETECTED.  The SARS-CoV-2 RNA is generally detectable in upper respiratory specimens during the acute phase of infection. The lowest concentration of SARS-CoV-2 viral copies this assay can detect is 138 copies/mL. A negative result does not preclude SARS-Cov-2 infection and should not be used as the sole basis for treatment or other patient management decisions. A negative result may occur with  improper specimen collection/handling, submission of specimen other than nasopharyngeal swab, presence of viral mutation(s) within the areas targeted by this assay,  and inadequate number of viral copies(<138 copies/mL). A negative result must be combined with clinical observations, patient history, and epidemiological information. The expected result is Negative.  Fact Sheet for Patients:  EntrepreneurPulse.com.au  Fact Sheet for Healthcare Providers:  IncredibleEmployment.be  This test is no t yet approved or cleared by the [place] FDA and  has been authorized for detection and/or diagnosis of SARS-CoV-2 by FDA under an Emergency Use Authorization (EUA). This EUA will remain  in effect (meaning this test can be used) for the duration of the COVID-19 declaration under Section 564(b)(1) of the Act, 21 U.S.C.section 360bbb-3(b)(1), unless the authorization is terminated  or revoked sooner.       Influenza A by PCR NEGATIVE NEGATIVE   Influenza B by PCR NEGATIVE NEGATIVE    Comment: (NOTE) The Xpert Xpress SARS-CoV-2/FLU/RSV plus assay is intended as an aid in the diagnosis of influenza from Nasopharyngeal swab specimens and should not be used as a sole basis for treatment. Nasal washings and aspirates are unacceptable for Xpert Xpress SARS-CoV-2/FLU/RSV testing.  Fact Sheet for Patients: EntrepreneurPulse.com.au  Fact Sheet for Healthcare Providers: IncredibleEmployment.be  This test is not yet approved or cleared by the [place] FDA and has been authorized for detection and/or diagnosis of SARS-CoV-2 by FDA under an Emergency Use Authorization (EUA). This EUA will remain in effect (meaning this test can be used) for the duration of the COVID-19 declaration under Section 564(b)(1) of the Act, 21 U.S.C. section 360bbb-3(b)(1), unless the authorization is terminated or revoked.  Performed at [facility], [place] [address], [place], [place] [address]   Gamma GT     Status: None   Collection Time: 07/17/21  3:35 PM  Result Value Ref Range   GGT 15 7 -  50 U/L    Comment: Performed at [facility], [place] [address] [place], [place] [address]  Procalcitonin - Baseline     Status: None   Collection Time: 07/17/21  8:44 PM  Result Value Ref Range   Procalcitonin 0.73 ng/mL    Comment:        Interpretation: PCT > 0.5 ng/mL and <= 2 ng/mL: Systemic infection (sepsis) is possible, but other conditions are known to elevate PCT as well. (NOTE)       Sepsis PCT Algorithm           Lower Respiratory Tract                                      Infection PCT Algorithm    ----------------------------     ----------------------------         PCT < 0.25 ng/mL                PCT <  0.10 ng/mL          Strongly encourage             Strongly discourage   discontinuation of antibiotics    initiation of antibiotics    ----------------------------     -----------------------------       PCT 0.25 - 0.50 ng/mL            PCT 0.10 - 0.25 ng/mL               OR       >80% decrease in PCT            Discourage initiation of                                            antibiotics      Encourage discontinuation           of antibiotics    ----------------------------     -----------------------------         PCT >= 0.50 ng/mL              PCT 0.26 - 0.50 ng/mL                AND       <80% decrease in PCT             Encourage initiation of                                             antibiotics       Encourage continuation           of antibiotics    ----------------------------     -----------------------------        PCT >= 0.50 ng/mL                  PCT > 0.50 ng/mL               AND         increase in PCT                  Strongly encourage                                      initiation of antibiotics    Strongly encourage escalation           of antibiotics                                     -----------------------------                                           PCT <= 0.25 ng/mL                                                 OR                                         >  80% decrease in PCT                                      Discontinue / Do not initiate                                             antibiotics  Performed at [facility], [place] [address], [place], [place] [address]   Respiratory (~20 pathogens) panel by PCR     Status: None   Collection Time: 07/17/21  9:52 PM   Specimen: Nasopharyngeal Swab; Respiratory  Result Value Ref Range   Adenovirus NOT DETECTED NOT DETECTED   Coronavirus 229E NOT DETECTED NOT DETECTED    Comment: (NOTE) The Coronavirus on the Respiratory Panel, DOES NOT test for the novel  Coronavirus (2019 nCoV)    Coronavirus HKU1 NOT DETECTED NOT DETECTED   Coronavirus NL63 NOT DETECTED NOT DETECTED   Coronavirus OC43 NOT DETECTED NOT DETECTED   Metapneumovirus NOT DETECTED NOT DETECTED   Rhinovirus / Enterovirus NOT DETECTED NOT DETECTED   Influenza A NOT DETECTED NOT DETECTED   Influenza B NOT DETECTED NOT DETECTED   Parainfluenza Virus 1 NOT DETECTED NOT DETECTED   Parainfluenza Virus 2 NOT DETECTED NOT DETECTED   Parainfluenza Virus 3 NOT DETECTED NOT DETECTED   Parainfluenza Virus 4 NOT DETECTED NOT DETECTED   Respiratory Syncytial Virus NOT DETECTED NOT DETECTED   Bordetella pertussis NOT DETECTED NOT DETECTED   Bordetella Parapertussis NOT DETECTED NOT DETECTED   Chlamydophila pneumoniae NOT DETECTED NOT DETECTED   Mycoplasma pneumoniae NOT DETECTED NOT DETECTED    Comment: Performed at [facility], [place] [address], [place], [place] [address]  Hemoglobin A1c     Status: Abnormal   Collection Time: 07/17/21 10:30 PM  Result Value Ref Range   Hgb A1c MFr Bld 6.8 (H) 4.8 - 5.6 %    Comment: (NOTE)         Prediabetes: 5.7 - 6.4         Diabetes: >6.4         Glycemic control for adults with diabetes: <7.0    Mean Plasma Glucose 148 mg/dL    Comment: (NOTE) Performed At: [facility] [place], [place] [id number] Rush Farmer MD  [id number]   Glucose, capillary     Status: Abnormal   Collection Time: 07/17/21 10:46 PM  Result Value Ref Range   Glucose-Capillary 132 (H) 70 - 99 mg/dL    Comment: Glucose reference range applies only to samples taken after fasting for at least 8 hours.  MRSA Next Gen by PCR, Nasal     Status: None   Collection Time: 07/17/21 11:05 PM   Specimen: Nasal Mucosa; Nasal Swab  Result Value Ref Range   MRSA by PCR Next Gen NOT DETECTED NOT DETECTED    Comment: (NOTE) The GeneXpert MRSA Assay (FDA approved for NASAL specimens only), is one component of a comprehensive MRSA colonization surveillance program. It is not intended to diagnose MRSA infection nor to guide or monitor treatment for MRSA infections. Test performance is not FDA approved in patients less than 74 years old. Performed at [facility], [place] [address] [place],  [address]   Procalcitonin     Status: None  Collection Time: 07/18/21  3:00 AM  Result Value Ref Range   Procalcitonin 0.79 ng/mL    Comment:        Interpretation: PCT > 0.5 ng/mL and <= 2 ng/mL: Systemic infection (sepsis) is possible, but other conditions are known to elevate PCT as well. (NOTE)       Sepsis PCT Algorithm           Lower Respiratory Tract                                      Infection PCT Algorithm    ----------------------------     ----------------------------         PCT < 0.25 ng/mL                PCT < 0.10 ng/mL          Strongly encourage             Strongly discourage   discontinuation of antibiotics    initiation of antibiotics    ----------------------------     -----------------------------       PCT 0.25 - 0.50 ng/mL            PCT 0.10 - 0.25 ng/mL               OR       >80% decrease in PCT            Discourage initiation of                                            antibiotics      Encourage discontinuation           of antibiotics    ----------------------------      -----------------------------         PCT >= 0.50 ng/mL              PCT 0.26 - 0.50 ng/mL                AND       <80% decrease in PCT             Encourage initiation of                                             antibiotics       Encourage continuation           of antibiotics    ----------------------------     -----------------------------        PCT >= 0.50 ng/mL                  PCT > 0.50 ng/mL               AND         increase in PCT                  Strongly encourage                                      initiation of antibiotics  Strongly encourage escalation           of antibiotics                                     -----------------------------                                           PCT <= 0.25 ng/mL                                                 OR                                        > 80% decrease in PCT                                      Discontinue / Do not initiate                                             antibiotics  Performed at [facility], [place] [address] [place], [place] [address]   Comprehensive metabolic panel     Status: Abnormal   Collection Time: 07/18/21  3:00 AM  Result Value Ref Range   Sodium 135 135 - 145 mmol/L   Potassium 3.7 3.5 - 5.1 mmol/L   Chloride 104 98 - 111 mmol/L   CO2 24 22 - 32 mmol/L   Glucose, Bld 132 (H) 70 - 99 mg/dL    Comment: Glucose reference range applies only to samples taken after fasting for at least 8 hours.   BUN 18 8 - 23 mg/dL   Creatinine, Ser 1.34 (H) 0.44 - 1.00 mg/dL   Calcium 8.3 (L) 8.9 - 10.3 mg/dL   Total Protein 5.8 (L) 6.5 - 8.1 g/dL   Albumin 2.7 (L) 3.5 - 5.0 g/dL   AST 21 15 - 41 U/L   ALT 19 0 - 44 U/L   Alkaline Phosphatase 48 38 - 126 U/L   Total Bilirubin 1.2 0.3 - 1.2 mg/dL   GFR, Estimated 41 (L) >60 mL/min    Comment: (NOTE) Calculated using the CKD-EPI Creatinine Equation (2021)    Anion gap 7 5 - 15    Comment: Performed at [facility], [place] [address] [place], [place] [address]  CBC     Status: Abnormal   Collection Time: 07/18/21  3:00 AM  Result Value Ref Range   WBC 20.0 (H) 4.0 - 10.5 K/uL   RBC 3.71 (L) 3.87 - 5.11 MIL/uL   Hemoglobin 11.5 (L) 12.0 - 15.0 g/dL   HCT 35.0 (L) 36.0 - 46.0 %   MCV 94.3 80.0 - 100.0 fL   MCH 31.0 26.0 - 34.0 pg   MCHC 32.9 30.0 - 36.0 g/dL   RDW 13.9 11.5 - 15.5 %   Platelets 214 150 - 400  K/uL   nRBC 0.0 0.0 - 0.2 %    Comment: Performed at [facility], [place] [address], [place], [place] [address]  Glucose, capillary     Status: Abnormal   Collection Time: 07/18/21  7:06 AM  Result Value Ref Range   Glucose-Capillary 137 (H) 70 - 99 mg/dL    Comment: Glucose reference range applies only to samples taken after fasting for at least 8 hours.   Comment 1 Notify RN    Comment 2 Document in Chart   Glucose, capillary     Status: Abnormal   Collection Time: 07/18/21 11:25 AM  Result Value Ref Range   Glucose-Capillary 119 (H) 70 - 99 mg/dL    Comment: Glucose reference range applies only to samples taken after fasting for at least 8 hours.   Comment 1 Notify RN    Comment 2 Document in Chart   Glucose, capillary     Status: Abnormal   Collection Time: 07/18/21  4:18 PM  Result Value Ref Range   Glucose-Capillary 121 (H) 70 - 99 mg/dL    Comment: Glucose reference range applies only to samples taken after fasting for at least 8 hours.   Comment 1 Notify RN    Comment 2 Document in Chart   Glucose, capillary     Status: Abnormal   Collection Time: 07/18/21  9:25 PM  Result Value Ref Range   Glucose-Capillary 121 (H) 70 - 99 mg/dL    Comment: Glucose reference range applies only to samples taken after fasting for at least 8 hours.  Procalcitonin     Status: None   Collection Time: 07/19/21  2:17 AM  Result Value Ref Range   Procalcitonin 0.77 ng/mL    Comment:        Interpretation: PCT > 0.5 ng/mL and <= 2 ng/mL: Systemic infection (sepsis) is possible, but other conditions are known  to elevate PCT as well. (NOTE)       Sepsis PCT Algorithm           Lower Respiratory Tract                                      Infection PCT Algorithm    ----------------------------     ----------------------------         PCT < 0.25 ng/mL                PCT < 0.10 ng/mL          Strongly encourage             Strongly discourage   discontinuation of antibiotics    initiation of antibiotics    ----------------------------     -----------------------------       PCT 0.25 - 0.50 ng/mL            PCT 0.10 - 0.25 ng/mL               OR       >80% decrease in PCT            Discourage initiation of                                            antibiotics      Encourage discontinuation  of antibiotics    ----------------------------     -----------------------------         PCT >= 0.50 ng/mL              PCT 0.26 - 0.50 ng/mL                AND       <80% decrease in PCT             Encourage initiation of                                             antibiotics       Encourage continuation           of antibiotics    ----------------------------     -----------------------------        PCT >= 0.50 ng/mL                  PCT > 0.50 ng/mL               AND         increase in PCT                  Strongly encourage                                      initiation of antibiotics    Strongly encourage escalation           of antibiotics                                     -----------------------------                                           PCT <= 0.25 ng/mL                                                 OR                                        > 80% decrease in PCT                                      Discontinue / Do not initiate                                             antibiotics  Performed at [facility], [address] [place],  [address]   CBC     Status: Abnormal   Collection Time: 07/19/21  2:17 AM  Result Value Ref Range   WBC 14.8 (H) 4.0 -  10.5 K/uL  RBC 3.49 (L) 3.87 - 5.11 MIL/uL   Hemoglobin 11.0 (L) 12.0 - 15.0 g/dL   HCT 33.3 (L) 36.0 - 46.0 %   MCV 95.4 80.0 - 100.0 fL   MCH 31.5 26.0 - 34.0 pg   MCHC 33.0 30.0 - 36.0 g/dL   RDW 13.8 11.5 - 15.5 %   Platelets 244 150 - 400 K/uL   nRBC 0.0 0.0 - 0.2 %    Comment: Performed at [facility] [address] [place], [place] [address]  Comprehensive metabolic panel     Status: Abnormal   Collection Time: 07/19/21  2:17 AM  Result Value Ref Range   Sodium 132 (L) 135 - 145 mmol/L   Potassium 3.9 3.5 - 5.1 mmol/L   Chloride 103 98 - 111 mmol/L   CO2 24 22 - 32 mmol/L   Glucose, Bld 131 (H) 70 - 99 mg/dL    Comment: Glucose reference range applies only to samples taken after fasting for at least 8 hours.   BUN 20 8 - 23 mg/dL   Creatinine, Ser 1.28 (H) 0.44 - 1.00 mg/dL   Calcium 8.4 (L) 8.9 - 10.3 mg/dL   Total Protein 5.7 (L) 6.5 - 8.1 g/dL   Albumin 2.4 (L) 3.5 - 5.0 g/dL   AST 29 15 - 41 U/L   ALT 28 0 - 44 U/L   Alkaline Phosphatase 52 38 - 126 U/L   Total Bilirubin 1.0 0.3 - 1.2 mg/dL   GFR, Estimated 43 (L) >60 mL/min    Comment: (NOTE) Calculated using the CKD-EPI Creatinine Equation (2021)    Anion gap 5 5 - 15    Comment: Performed at [facility], [place] [address], [place], [place] [address]  Glucose, capillary     Status: Abnormal   Collection Time: 07/19/21  7:40 AM  Result Value Ref Range   Glucose-Capillary 119 (H) 70 - 99 mg/dL    Comment: Glucose reference range applies only to samples taken after fasting for at least 8 hours.    NM Hepatobiliary Liver Func  Result Date: 07/18/2021 CLINICAL DATA:  Abdominal pain. EXAM: NUCLEAR MEDICINE HEPATOBILIARY IMAGING TECHNIQUE: Sequential images of the abdomen were obtained out to 60 minutes following intravenous administration of radiopharmaceutical. RADIOPHARMACEUTICALS:  5.3 mCi Tc-37m  Choletec IV COMPARISON:  CT abdomen pelvis, 07/17/2021 FINDINGS: Prompt uptake and biliary excretion  of activity by the liver is seen. Gallbladder activity is visualized, consistent with patency of cystic duct. Biliary activity passes into small bowel, consistent with patent common bile duct. IMPRESSION: 1.  The cystic and common bile ducts are patent. 2. Gallbladder ejection fraction was not assessed on this examination. Please note that gallbladder ejection fraction measurement is not reliable or validated in the acute inpatient setting and assessment for gallbladder dysmotility should generally be deferred for outpatient evaluation. Electronically Signed   By: Delanna Ahmadi M.D.   On: 07/18/2021 09:14   US PELVIS LIMITED (TRANSABDOMINAL ONLY)  Result Date: 07/17/2021 CLINICAL DATA:  Abnormal CT. EXAM: TRANSABDOMINAL ULTRASOUND OF PELVIS TECHNIQUE: Transabdominal ultrasound examination of the pelvis was performed including evaluation of the uterus, ovaries, adnexal regions, and pelvic cul-de-sac. COMPARISON:  Abdominal ultrasound and CT abdomen and pelvis 07/17/2021. FINDINGS: Uterus Measurements: 8.1 x 4.0 x 4.9 cm = volume: 82 mL. 1.8 x 0.7 x 1.9 cm calcified exophytic fibroid. Myometrium otherwise within normal limits. Endometrium Thickness: 9 mm.  Difficult to visualize.  Appears echogenic. Right ovary Not visualized. Left ovary There is  a 5.7 x 6.4 x 7.2 cm mass in the left adnexa. This is slightly hypoechoic. Separate normal appearing left ovary is not visualized. Other findings:  No abnormal free fluid. IMPRESSION: 1. Technically limited study. 2. Indeterminate left adnexal mass measuring 7.2 cm may be related to ovarian malignancy or other ovarian tumor. 3. Endometrium appears thickened and echogenic. Differential diagnosis includes endometrial hyperplasia, polyp or carcinoma. 4. Right ovary not visualized. 5. Small calcified uterine fibroid. Electronically Signed   By: Ronney Asters M.D.   On: 07/17/2021 17:23   CT ABDOMEN PELVIS W CONTRAST  Result Date: 07/17/2021 CLINICAL DATA:  Constant  severe right upper quadrant abdominal pain since yesterday. Fever. Intermittent vomiting. EXAM: CT ABDOMEN AND PELVIS WITH CONTRAST TECHNIQUE: Multidetector CT imaging of the abdomen and pelvis was performed using the standard protocol following bolus administration of intravenous contrast. CONTRAST:  71mL OMNIPAQUE IOHEXOL 300 MG/ML  SOLN COMPARISON:  None. FINDINGS: Lower chest: Dense consolidation in the posterior right middle lobe with small air bronchograms. Mild patchy and linear atelectasis in the right lower lobe. Small right pleural effusion. Mild atelectasis at the left lung base. Hepatobiliary: Borderline dilated gallbladder, measuring 10.3 cm in length. No visible gallstones. No gallbladder wall thickening or pericholecystic fluid. Two small cysts in the left lobe of the liver. Pancreas: Unremarkable. No pancreatic ductal dilatation or surrounding inflammatory changes. Spleen: Normal in size without focal abnormality. Adrenals/Urinary Tract: 2.1 cm homogeneously fat density mass in the lateral right kidney. Unremarkable left kidney, ureters, urinary bladder and adrenal glands. Stomach/Bowel: Multiple sigmoid and distal descending colon diverticula without evidence of diverticulitis. Fluid in the right colon without wall thickening. Unremarkable stomach, small bowel and appendix. Vascular/Lymphatic: Atheromatous arterial calcifications without aneurysm. No enlarged lymph nodes. Reproductive: Normal sized uterus containing a 1.4 cm anterior exophytic densely calcified fibroid. Heterogeneous low density in the endometrial cavity measuring 3.4 x 2.3 cm on image number 74/3. Cystic mass posterior to the uterus on the right which without supper identification of the right ovary. This measures 7.0 x 4.2 cm on image number 75/3. Heterogeneous solid mass posterior to the uterus on the left without separate identification of the left ovary. This mass measures 9.1 x 7.8 cm on image number 74/3. Other: Small  umbilical hernia containing fat. No free peritoneal air seen. Minimal free fluid in the left posterior para colonic gutter. Musculoskeletal: Lumbar and lower thoracic spine degenerative changes with associated grade 1 anterolisthesis at the L4-5 level. No pars defects are seen. Mild scoliosis. IMPRESSION: 1. Dense right middle lobe atelectasis or pneumonia. 2. Small right pleural effusion. 3. Mild bibasilar atelectasis. 4. 7.0 cm cystic right adnexal mass and 9.1 cm solid left adnexal mass. These could be further evaluated with elective pelvic ultrasound or MRI of the pelvis. 5. Thickened, heterogeneous endometrium, suspicious for endometrial carcinoma. 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 7. Sigmoid and distal descending colon diverticulosis. Electronically Signed   By: Claudie Revering M.D.   On: 07/17/2021 13:57   US Abdomen Limited  Result Date: 07/17/2021 CLINICAL DATA:  RIGHT upper quadrant pain. EXAM: ULTRASOUND ABDOMEN LIMITED RIGHT UPPER QUADRANT COMPARISON:  CT abdomen from earlier same day. FINDINGS: Gallbladder: Gallbladder is distended. Portions of the gallbladder are suboptimally visualized. Questionable gallbladder wall thickening at the fundus. No pericholecystic fluid. No sonographic Murphy's sign elicited during the exam, per the sonographer. Common bile duct: Diameter: 4 mm Liver: No focal lesion identified. Generalized hyperechogenicity suggesting some degree of fatty infiltration. Portal vein is patent on color Doppler  imaging with normal direction of blood flow towards the liver. Other: None. IMPRESSION: 1. No convincing evidence of acute cholecystitis. No gallstones seen. Questionable gallbladder wall thickening at the fundus. If localizable RIGHT upper quadrant pain persists or worsens, consider nuclear medicine HIDA scan for further characterization. 2. No bile duct dilatation. 3. Probable fatty infiltration of the liver. Electronically Signed   By: Franki Cabot M.D.   On: 07/17/2021  14:32   DG CHEST PORT 1 VIEW  Result Date: 07/17/2021 CLINICAL DATA:  Right upper quadrant pain EXAM: PORTABLE CHEST 1 VIEW COMPARISON:  02/06/2005, CT 07/17/2021 FINDINGS: Consolidation in the right lung base. No pleural effusion. Cardiomediastinal silhouette within normal limits. Aortic atherosclerosis. No pneumothorax IMPRESSION: Consolidation in the right lung base is suspicious for pneumonia. Radiographic follow-up to resolution is recommended Electronically Signed   By: Donavan Foil M.D.   On: 07/17/2021 23:06    ROS negative except above she is tolerating clear liquids Blood pressure (!) 115/46, pulse 67, temperature 98.1 F (36.7 C), temperature source Oral, resp. rate 20, weight 114.3 kg, SpO2 95 %. Physical Exam vital signs stable afebrile no acute distress exam pertinent for abdomen being soft nontender no rib cage tenderness either cannot duplicate the pain labs and x-rays reviewed  Assessment/Plan: Right upper quadrant pain minimally elevated bilirubin and fever Plan: We will proceed with an MRCP and MRI of the bile ducts further rule out stone disease as a cause otherwise I do not think an endoscopy would be revealing since ulcers would rarely cause fever and she is not on any ulcerogenic medicine and rarely with they come on this suddenly and if MRCP okay can slowly advance diet and hopefully take care of her gynecologic issue soon  [facility] E 07/19/2021, 11:22 AM

## 2021-07-19 NOTE — Progress Notes (Signed)
PROGRESS NOTE    Jade Sanchez  VFI:[id number] DOB: Jul 30, 1941 DOA: 07/17/2021 PCP: Lavone Orn, MD  Brief Narrative: 80/F with history of type 2 diabetes mellitus, hypertension, dyslipidemia, CKD 3a, primary hyperparathyroidism presented to the ED with severe right upper quadrant pain nausea vomiting and fever, symptoms ongoing for 2 days Work-up in the ED noted WBC of 25.5, T-max of 101.7, lipase of 33, total bili of 1.8, CT abdomen noted gallbladder wall thickening, right middle/lower lobe atelectasis with small pleural effusion and cystic adnexal masses bilaterally and heterogeneous endometrial thickening  Assessment & Plan:   Fever, RUQ pain Sepsis, POA -CT abdomen noted borderline dilated gallbladder only and additional findings in the adnexa and endometrium, ultrasound noted  ? Gallbladder wall thickening at the fundus -HIDA scan completed 11/29 noted patent cystic and bile ducts -On exam had significant right upper quadrant tenderness yesterday, now improving,  -Etiology remains unclear, clinically symptoms/presentation most consistent with acute cholecystitis however imaging does not corroborate -She has no symptoms of pneumonia, suspect right lung atelectasis and small pleural effusion is reactive, would not expect this to cause right upper quadrant tenderness regardless -Will request gastroenterology eval, d/w Dr.Magod -Continue IV Zosyn, continue clears -Discontinue IV fluids today, developing edema -Labs in a.m.  Bilateral adnexal cystic masses and thickened endometrial lining -CT findings concerning for GYN malignancy, pelvic ultrasound could not be performed yesterday -Has no symptoms secondary to this, will need gynecology evaluation once acute issues have resolved  Type 2 diabetes mellitus -Diet controlled, last A1c was 6.7, CBGs are stable, continue sliding scale insulin  CKD 3a -Creatinine stable, discontinue IV fluids  Hypertension Chronic  edema -Lasix and Micardis held on admission in the setting of soft BPs -Some edema today, stop IV fluids, Lasix x1  DVT prophylaxis: Lovenox Code Status: Full code Family Communication: Discussed with patient and son at bedside Disposition Plan:  Status is: Inpatient  Remains inpatient appropriate because: Severity of illness   Consultants:  General surgery  Procedures:   Antimicrobials:    Subjective: -Feels better today, right upper quadrant abdominal pain is improving, no vomiting, fevers are better  Objective: Vitals:   07/18/21 1700 07/18/21 1920 07/19/21 0320 07/19/21 0745  BP: (!) 122/58 (!) 124/37 (!) 107/39 (!) 115/46  Pulse:  71 62 67  Resp:  20 18 20   Temp: 97.9 F (36.6 C) 98.2 F (36.8 C) 98 F (36.7 C) 98.1 F (36.7 C)  TempSrc: Oral Oral Oral Oral  SpO2:  96% 90% 95%  Weight:        Intake/Output Summary (Last 24 hours) at 07/19/2021 0950 Last data filed at 07/19/2021 0501 Gross per 24 hour  Intake 840 ml  Output 1025 ml  Net -185 ml   Filed Weights   07/17/21 2100  Weight: 114.3 kg    Examination:  General exam: Pleasant elderly female sitting up in bed, uncomfortable appearing, AAOx3 CVS: S1-S2, regular rate rhythm Lungs: Decreased breath sounds at the bases, few Rales at the right base Abdomen: Soft, mildly distended, improving right upper quadrant tenderness, bowel sounds decreased but present Extremities: No edema Skin: No rash on exposed skin  Data Reviewed:   CBC: Recent Labs  Lab 07/17/21 1101 07/18/21 0300 07/19/21 0217  WBC 25.5* 20.0* 14.8*  NEUTROABS 23.2*  --   --   HGB 13.5 11.5* 11.0*  HCT 40.9 35.0* 33.3*  MCV 93.8 94.3 95.4  PLT 240 214 416   Basic Metabolic Panel: Recent Labs  Lab 07/17/21 1101 07/18/21  0300 07/19/21 0217  NA 132* 135 132*  K 3.9 3.7 3.9  CL 100 104 103  CO2 24 24 24   GLUCOSE 160* 132* 131*  BUN 16 18 20   CREATININE 1.26* 1.34* 1.28*  CALCIUM 9.6 8.3* 8.4*   GFR: CrCl cannot be  calculated (Unknown ideal weight.). Liver Function Tests: Recent Labs  Lab 07/17/21 1101 07/18/21 0300 07/19/21 0217  AST 21 21 29   ALT 19 19 28   ALKPHOS 55 48 52  BILITOT 1.8* 1.2 1.0  PROT 7.2 5.8* 5.7*  ALBUMIN 3.4* 2.7* 2.4*   Recent Labs  Lab 07/17/21 1101  LIPASE 33   No results for input(s): AMMONIA in the last 168 hours. Coagulation Profile: No results for input(s): INR, PROTIME in the last 168 hours. Cardiac Enzymes: No results for input(s): CKTOTAL, CKMB, CKMBINDEX, TROPONINI in the last 168 hours. BNP (last 3 results) No results for input(s): PROBNP in the last 8760 hours. HbA1C: Recent Labs    07/17/21 2230  HGBA1C 6.8*   CBG: Recent Labs  Lab 07/18/21 0706 07/18/21 1125 07/18/21 1618 07/18/21 2125 07/19/21 0740  GLUCAP 137* 119* 121* 121* 119*   Lipid Profile: No results for input(s): CHOL, HDL, LDLCALC, TRIG, CHOLHDL, LDLDIRECT in the last 72 hours. Thyroid Function Tests: No results for input(s): TSH, T4TOTAL, FREET4, T3FREE, THYROIDAB in the last 72 hours. Anemia Panel: No results for input(s): VITAMINB12, FOLATE, FERRITIN, TIBC, IRON, RETICCTPCT in the last 72 hours. Urine analysis:    Component Value Date/Time   COLORURINE AMBER (A) 07/17/2021 1050   APPEARANCEUR CLEAR 07/17/2021 1050   LABSPEC 1.025 07/17/2021 1050   PHURINE 5.5 07/17/2021 1050   GLUCOSEU NEGATIVE 07/17/2021 1050   HGBUR TRACE (A) 07/17/2021 [place] 07/17/2021 1050   KETONESUR NEGATIVE 07/17/2021 1050   PROTEINUR NEGATIVE 07/17/2021 1050   NITRITE NEGATIVE 07/17/2021 1050   LEUKOCYTESUR NEGATIVE 07/17/2021 1050   Sepsis Labs: @LABRCNTIP (procalcitonin:4,lacticidven:4)  ) Recent Results (from the past 240 hour(s))  Blood culture (routine x 2)     Status: None (Preliminary result)   Collection Time: 07/17/21 12:30 PM   Specimen: BLOOD RIGHT ARM  Result Value Ref Range Status   Specimen Description BLOOD RIGHT ARM  Final   Special Requests    Final    BOTTLES DRAWN AEROBIC AND ANAEROBIC Blood Culture adequate volume   Culture   Final    NO GROWTH 2 DAYS Performed at [facility], [place] [address] [place], [place] [address]    Report Status PENDING  Incomplete  Blood culture (routine x 2)     Status: None (Preliminary result)   Collection Time: 07/17/21 12:41 PM   Specimen: BLOOD  Result Value Ref Range Status   Specimen Description BLOOD SITE NOT SPECIFIED  Final   Special Requests   Final    BOTTLES DRAWN AEROBIC AND ANAEROBIC Blood Culture adequate volume   Culture   Final    NO GROWTH 2 DAYS Performed at [facility], [place] [address], [place], [place] [address]    Report Status PENDING  Incomplete  Resp Panel by RT-PCR (Flu A&B, Covid) Nasopharyngeal Swab     Status: None   Collection Time: 07/17/21  3:35 PM   Specimen: Nasopharyngeal Swab; Nasopharyngeal(NP) swabs in vial transport medium  Result Value Ref Range Status   SARS Coronavirus 2 by RT PCR NEGATIVE NEGATIVE Final    Comment: (NOTE) SARS-CoV-2 target nucleic acids are NOT DETECTED.  The SARS-CoV-2 RNA is generally  detectable in upper respiratory specimens during the acute phase of infection. The lowest concentration of SARS-CoV-2 viral copies this assay can detect is 138 copies/mL. A negative result does not preclude SARS-Cov-2 infection and should not be used as the sole basis for treatment or other patient management decisions. A negative result may occur with  improper specimen collection/handling, submission of specimen other than nasopharyngeal swab, presence of viral mutation(s) within the areas targeted by this assay, and inadequate number of viral copies(<138 copies/mL). A negative result must be combined with clinical observations, patient history, and epidemiological information. The expected result is Negative.  Fact Sheet for Patients:  EntrepreneurPulse.com.au  Fact Sheet for Healthcare Providers:   IncredibleEmployment.be  This test is no t yet approved or cleared by the [place] FDA and  has been authorized for detection and/or diagnosis of SARS-CoV-2 by FDA under an Emergency Use Authorization (EUA). This EUA will remain  in effect (meaning this test can be used) for the duration of the COVID-19 declaration under Section 564(b)(1) of the Act, 21 U.S.C.section 360bbb-3(b)(1), unless the authorization is terminated  or revoked sooner.       Influenza A by PCR NEGATIVE NEGATIVE Final   Influenza B by PCR NEGATIVE NEGATIVE Final    Comment: (NOTE) The Xpert Xpress SARS-CoV-2/FLU/RSV plus assay is intended as an aid in the diagnosis of influenza from Nasopharyngeal swab specimens and should not be used as a sole basis for treatment. Nasal washings and aspirates are unacceptable for Xpert Xpress SARS-CoV-2/FLU/RSV testing.  Fact Sheet for Patients: EntrepreneurPulse.com.au  Fact Sheet for Healthcare Providers: IncredibleEmployment.be  This test is not yet approved or cleared by the [place] FDA and has been authorized for detection and/or diagnosis of SARS-CoV-2 by FDA under an Emergency Use Authorization (EUA). This EUA will remain in effect (meaning this test can be used) for the duration of the COVID-19 declaration under Section 564(b)(1) of the Act, 21 U.S.C. section 360bbb-3(b)(1), unless the authorization is terminated or revoked.  Performed at [facility], [place] [address] [place], [place] [address]   Respiratory (~20 pathogens) panel by PCR     Status: None   Collection Time: 07/17/21  9:52 PM   Specimen: Nasopharyngeal Swab; Respiratory  Result Value Ref Range Status   Adenovirus NOT DETECTED NOT DETECTED Final   Coronavirus 229E NOT DETECTED NOT DETECTED Final    Comment: (NOTE) The Coronavirus on the Respiratory Panel, DOES NOT test for the novel  Coronavirus (2019 nCoV)    Coronavirus  HKU1 NOT DETECTED NOT DETECTED Final   Coronavirus NL63 NOT DETECTED NOT DETECTED Final   Coronavirus OC43 NOT DETECTED NOT DETECTED Final   Metapneumovirus NOT DETECTED NOT DETECTED Final   Rhinovirus / Enterovirus NOT DETECTED NOT DETECTED Final   Influenza A NOT DETECTED NOT DETECTED Final   Influenza B NOT DETECTED NOT DETECTED Final   Parainfluenza Virus 1 NOT DETECTED NOT DETECTED Final   Parainfluenza Virus 2 NOT DETECTED NOT DETECTED Final   Parainfluenza Virus 3 NOT DETECTED NOT DETECTED Final   Parainfluenza Virus 4 NOT DETECTED NOT DETECTED Final   Respiratory Syncytial Virus NOT DETECTED NOT DETECTED Final   Bordetella pertussis NOT DETECTED NOT DETECTED Final   Bordetella Parapertussis NOT DETECTED NOT DETECTED Final   Chlamydophila pneumoniae NOT DETECTED NOT DETECTED Final   Mycoplasma pneumoniae NOT DETECTED NOT DETECTED Final    Comment: Performed at [facility], [place]. [address], [place], [place] [address]  MRSA Next Gen by  PCR, Nasal     Status: None   Collection Time: 07/17/21 11:05 PM   Specimen: Nasal Mucosa; Nasal Swab  Result Value Ref Range Status   MRSA by PCR Next Gen NOT DETECTED NOT DETECTED Final    Comment: (NOTE) The GeneXpert MRSA Assay (FDA approved for NASAL specimens only), is one component of a comprehensive MRSA colonization surveillance program. It is not intended to diagnose MRSA infection nor to guide or monitor treatment for MRSA infections. Test performance is not FDA approved in patients less than 96 years old. Performed at [facility], [place] [address] [place], [place] [address]          Radiology Studies: NM Hepatobiliary Liver Func  Result Date: 07/18/2021 CLINICAL DATA:  Abdominal pain. EXAM: NUCLEAR MEDICINE HEPATOBILIARY IMAGING TECHNIQUE: Sequential images of the abdomen were obtained out to 60 minutes following intravenous administration of radiopharmaceutical. RADIOPHARMACEUTICALS:  5.3 mCi Tc-37m  Choletec IV  COMPARISON:  CT abdomen pelvis, 07/17/2021 FINDINGS: Prompt uptake and biliary excretion of activity by the liver is seen. Gallbladder activity is visualized, consistent with patency of cystic duct. Biliary activity passes into small bowel, consistent with patent common bile duct. IMPRESSION: 1.  The cystic and common bile ducts are patent. 2. Gallbladder ejection fraction was not assessed on this examination. Please note that gallbladder ejection fraction measurement is not reliable or validated in the acute inpatient setting and assessment for gallbladder dysmotility should generally be deferred for outpatient evaluation. Electronically Signed   By: Delanna Ahmadi M.D.   On: 07/18/2021 09:14   US PELVIS LIMITED (TRANSABDOMINAL ONLY)  Result Date: 07/17/2021 CLINICAL DATA:  Abnormal CT. EXAM: TRANSABDOMINAL ULTRASOUND OF PELVIS TECHNIQUE: Transabdominal ultrasound examination of the pelvis was performed including evaluation of the uterus, ovaries, adnexal regions, and pelvic cul-de-sac. COMPARISON:  Abdominal ultrasound and CT abdomen and pelvis 07/17/2021. FINDINGS: Uterus Measurements: 8.1 x 4.0 x 4.9 cm = volume: 82 mL. 1.8 x 0.7 x 1.9 cm calcified exophytic fibroid. Myometrium otherwise within normal limits. Endometrium Thickness: 9 mm.  Difficult to visualize.  Appears echogenic. Right ovary Not visualized. Left ovary There is a 5.7 x 6.4 x 7.2 cm mass in the left adnexa. This is slightly hypoechoic. Separate normal appearing left ovary is not visualized. Other findings:  No abnormal free fluid. IMPRESSION: 1. Technically limited study. 2. Indeterminate left adnexal mass measuring 7.2 cm may be related to ovarian malignancy or other ovarian tumor. 3. Endometrium appears thickened and echogenic. Differential diagnosis includes endometrial hyperplasia, polyp or carcinoma. 4. Right ovary not visualized. 5. Small calcified uterine fibroid. Electronically Signed   By: Ronney Asters M.D.   On: 07/17/2021 17:23    CT ABDOMEN PELVIS W CONTRAST  Result Date: 07/17/2021 CLINICAL DATA:  Constant severe right upper quadrant abdominal pain since yesterday. Fever. Intermittent vomiting. EXAM: CT ABDOMEN AND PELVIS WITH CONTRAST TECHNIQUE: Multidetector CT imaging of the abdomen and pelvis was performed using the standard protocol following bolus administration of intravenous contrast. CONTRAST:  27mL OMNIPAQUE IOHEXOL 300 MG/ML  SOLN COMPARISON:  None. FINDINGS: Lower chest: Dense consolidation in the posterior right middle lobe with small air bronchograms. Mild patchy and linear atelectasis in the right lower lobe. Small right pleural effusion. Mild atelectasis at the left lung base. Hepatobiliary: Borderline dilated gallbladder, measuring 10.3 cm in length. No visible gallstones. No gallbladder wall thickening or pericholecystic fluid. Two small cysts in the left lobe of the liver. Pancreas: Unremarkable. No pancreatic ductal dilatation or surrounding inflammatory changes. Spleen:  Normal in size without focal abnormality. Adrenals/Urinary Tract: 2.1 cm homogeneously fat density mass in the lateral right kidney. Unremarkable left kidney, ureters, urinary bladder and adrenal glands. Stomach/Bowel: Multiple sigmoid and distal descending colon diverticula without evidence of diverticulitis. Fluid in the right colon without wall thickening. Unremarkable stomach, small bowel and appendix. Vascular/Lymphatic: Atheromatous arterial calcifications without aneurysm. No enlarged lymph nodes. Reproductive: Normal sized uterus containing a 1.4 cm anterior exophytic densely calcified fibroid. Heterogeneous low density in the endometrial cavity measuring 3.4 x 2.3 cm on image number 74/3. Cystic mass posterior to the uterus on the right which without supper identification of the right ovary. This measures 7.0 x 4.2 cm on image number 75/3. Heterogeneous solid mass posterior to the uterus on the left without separate identification of the  left ovary. This mass measures 9.1 x 7.8 cm on image number 74/3. Other: Small umbilical hernia containing fat. No free peritoneal air seen. Minimal free fluid in the left posterior para colonic gutter. Musculoskeletal: Lumbar and lower thoracic spine degenerative changes with associated grade 1 anterolisthesis at the L4-5 level. No pars defects are seen. Mild scoliosis. IMPRESSION: 1. Dense right middle lobe atelectasis or pneumonia. 2. Small right pleural effusion. 3. Mild bibasilar atelectasis. 4. 7.0 cm cystic right adnexal mass and 9.1 cm solid left adnexal mass. These could be further evaluated with elective pelvic ultrasound or MRI of the pelvis. 5. Thickened, heterogeneous endometrium, suspicious for endometrial carcinoma. 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 7. Sigmoid and distal descending colon diverticulosis. Electronically Signed   By: Claudie Revering M.D.   On: 07/17/2021 13:57   US Abdomen Limited  Result Date: 07/17/2021 CLINICAL DATA:  RIGHT upper quadrant pain. EXAM: ULTRASOUND ABDOMEN LIMITED RIGHT UPPER QUADRANT COMPARISON:  CT abdomen from earlier same day. FINDINGS: Gallbladder: Gallbladder is distended. Portions of the gallbladder are suboptimally visualized. Questionable gallbladder wall thickening at the fundus. No pericholecystic fluid. No sonographic Murphy's sign elicited during the exam, per the sonographer. Common bile duct: Diameter: 4 mm Liver: No focal lesion identified. Generalized hyperechogenicity suggesting some degree of fatty infiltration. Portal vein is patent on color Doppler imaging with normal direction of blood flow towards the liver. Other: None. IMPRESSION: 1. No convincing evidence of acute cholecystitis. No gallstones seen. Questionable gallbladder wall thickening at the fundus. If localizable RIGHT upper quadrant pain persists or worsens, consider nuclear medicine HIDA scan for further characterization. 2. No bile duct dilatation. 3. Probable fatty infiltration of  the liver. Electronically Signed   By: Franki Cabot M.D.   On: 07/17/2021 14:32   DG CHEST PORT 1 VIEW  Result Date: 07/17/2021 CLINICAL DATA:  Right upper quadrant pain EXAM: PORTABLE CHEST 1 VIEW COMPARISON:  02/06/2005, CT 07/17/2021 FINDINGS: Consolidation in the right lung base. No pleural effusion. Cardiomediastinal silhouette within normal limits. Aortic atherosclerosis. No pneumothorax IMPRESSION: Consolidation in the right lung base is suspicious for pneumonia. Radiographic follow-up to resolution is recommended Electronically Signed   By: Donavan Foil M.D.   On: 07/17/2021 23:06     Scheduled Meds:  enoxaparin (LOVENOX) injection  40 mg Subcutaneous Q24H   furosemide  20 mg Intravenous Once   insulin aspart  0-15 Units Subcutaneous TID WC   rosuvastatin  5 mg Oral Daily   Continuous Infusions:  piperacillin-tazobactam (ZOSYN)  IV 3.375 g (07/19/21 0459)   vancomycin 1,000 mg (07/18/21 1521)     LOS: 2 days    Time spent: 55min  Domenic Polite, MD [facility]   07/19/2021,  9:50 AM

## 2021-07-20 ENCOUNTER — Inpatient Hospital Stay (HOSPITAL_COMMUNITY): Payer: Medicare Other

## 2021-07-20 DIAGNOSIS — R1011 Right upper quadrant pain: Secondary | ICD-10-CM

## 2021-07-20 DIAGNOSIS — R17 Unspecified jaundice: Secondary | ICD-10-CM | POA: Diagnosis not present

## 2021-07-20 DIAGNOSIS — I10 Essential (primary) hypertension: Secondary | ICD-10-CM | POA: Diagnosis not present

## 2021-07-20 DIAGNOSIS — N1831 Chronic kidney disease, stage 3a: Secondary | ICD-10-CM | POA: Diagnosis not present

## 2021-07-20 LAB — GLUCOSE, CAPILLARY
Glucose-Capillary: 116 mg/dL — ABNORMAL HIGH (ref 70–99)
Glucose-Capillary: 157 mg/dL — ABNORMAL HIGH (ref 70–99)
Glucose-Capillary: 126 mg/dL — ABNORMAL HIGH (ref 70–99)
Glucose-Capillary: 167 mg/dL — ABNORMAL HIGH (ref 70–99)

## 2021-07-20 LAB — CBC
HCT: 33.5 % — ABNORMAL LOW (ref 36.0–46.0)
Hemoglobin: 10.9 g/dL — ABNORMAL LOW (ref 12.0–15.0)
MCH: 30.5 pg (ref 26.0–34.0)
MCHC: 32.5 g/dL (ref 30.0–36.0)
MCV: 93.8 fL (ref 80.0–100.0)
Platelets: 269 10*3/uL (ref 150–400)
RBC: 3.57 MIL/uL — ABNORMAL LOW (ref 3.87–5.11)
RDW: 13.4 % (ref 11.5–15.5)
WBC: 10.7 10*3/uL — ABNORMAL HIGH (ref 4.0–10.5)
nRBC: 0 % (ref 0.0–0.2)

## 2021-07-20 LAB — COMPREHENSIVE METABOLIC PANEL
ALT: 25 U/L (ref 0–44)
AST: 34 U/L (ref 15–41)
Albumin: 2.3 g/dL — ABNORMAL LOW (ref 3.5–5.0)
Alkaline Phosphatase: 50 U/L (ref 38–126)
Anion gap: 6 (ref 5–15)
BUN: 17 mg/dL (ref 8–23)
CO2: 25 mmol/L (ref 22–32)
Calcium: 8.6 mg/dL — ABNORMAL LOW (ref 8.9–10.3)
Chloride: 103 mmol/L (ref 98–111)
Creatinine, Ser: 0.97 mg/dL (ref 0.44–1.00)
GFR, Estimated: 60 mL/min — ABNORMAL LOW (ref >60)
Glucose, Bld: 127 mg/dL — ABNORMAL HIGH (ref 70–99)
Potassium: 4.8 mmol/L (ref 3.5–5.1)
Sodium: 134 mmol/L — ABNORMAL LOW (ref 135–145)
Total Bilirubin: 1.8 mg/dL — ABNORMAL HIGH (ref 0.3–1.2)
Total Protein: 5.5 g/dL — ABNORMAL LOW (ref 6.5–8.1)

## 2021-07-20 LAB — LIPASE, BLOOD: Lipase: 46 U/L (ref 11–51)

## 2021-07-20 MED ORDER — IPRATROPIUM-ALBUTEROL 0.5-2.5 (3) MG/3ML IN SOLN
3.0000 mL | Freq: Three times a day (TID) | RESPIRATORY_TRACT | Status: DC
  Filled 2021-07-20: qty 3

## 2021-07-20 MED ORDER — IPRATROPIUM-ALBUTEROL 0.5-2.5 (3) MG/3ML IN SOLN
3.0000 mL | Freq: Four times a day (QID) | RESPIRATORY_TRACT | Status: DC
  Administered 2021-07-20 (×3): 3 mL via RESPIRATORY_TRACT
  Filled 2021-07-20 (×3): qty 3

## 2021-07-20 MED ORDER — GUAIFENESIN ER 600 MG PO TB12
600.0000 mg | ORAL_TABLET | Freq: Two times a day (BID) | ORAL | Status: DC
  Administered 2021-07-20 – 2021-07-26 (×11): 600 mg via ORAL
  Filled 2021-07-20 (×12): qty 1

## 2021-07-20 NOTE — Progress Notes (Signed)
[facility]  PROGRESS NOTE  Jeanie Mccard Glaeser TIR:[id number] DOB: 1940-09-13 DOA: 07/17/2021 PCP: Lavone Orn, MD   Brief HPI:   80 year old female with history of diabetes mellitus type 2, hypertension, dyslipidemia, CKD stage III, primary hyperparathyroidism came to ED with severe right upper quadrant pain, nausea vomiting and fever.  Symptoms ongoing for 2 days. Work-up in the ED noted WBC of 25.5, T-max one 1.7, lipase 33, total bilirubin 1.8.  CT abdomen/pelvis noted gallbladder wall thickening, right middle lobe/lower lobe atelectasis with small pleural effusion and cystic adnexal masses bilaterally and heterogenous endometrial thickening.  Patient was started on IV antibiotics for sepsis  Subjective   Patient seen and examined, complains of shortness of breath.   Assessment/Plan:   Sepsis, POA -Patient presented with fever, right upper quadrant pain -CT abdomen pelvis showed dilated gallbladder only, additional finding in the adnexa and endometrium -Ultrasound abdomen showed gallbladder wall thickening at the fundus, HIDA scan completed showed patent cystic and bile ducts -MRI abdomen obtained yesterday showed mild pancreatitis and hepatic steatosis -Patient was started on empiric vancomycin and Zosyn, WBC down to 10,000  Right lower lobe pneumonia versus atelectasis -Chest x-ray obtained today shows worsening of opacity -We will obtain CT chest with contrast to further delineate the right lower lobe opacity -Continue vancomycin and Zosyn -Patient has bilateral rhonchi on auscultation, will start DuoNeb every 6 hours scheduled  Bilateral adnexal cystic masses/thickened endometrial lining -CT findings concerning for GYN malignancy -She will need gynecological evaluation once medically stable  Diabetes mellitus type 2 -Last hemoglobin A1c was 6.7 -Continue sliding scale insulin with NovoLog -CBG well controlled  CKD stage IIIa -Creatinine stable at  0.97  Hypertension -Lasix and Micardis on hold -We will restart Lasix after CT chest with contrast        Medications     enoxaparin (LOVENOX) injection  40 mg Subcutaneous Q24H   guaiFENesin  600 mg Oral BID   insulin aspart  0-15 Units Subcutaneous TID WC   ipratropium-albuterol  3 mL Nebulization Q6H   rosuvastatin  5 mg Oral Daily     Data Reviewed:   CBG:  Recent Labs  Lab 07/19/21 1559 07/19/21 2101 07/20/21 0725 07/20/21 1128 07/20/21 1557  GLUCAP 112* 101* 116* 157* 126*    SpO2: 93 % O2 Flow Rate (L/min): 2 L/min    Vitals:   07/20/21 0727 07/20/21 1130 07/20/21 1427 07/20/21 1529  BP: (!) 127/44 138/62  140/69  Pulse: 66 66  86  Resp: 20 17  20   Temp: 98.5 F (36.9 C) 98.2 F (36.8 C)  98.6 F (37 C)  TempSrc: Oral Oral  Oral  SpO2: 92% 92% 94% 93%  Weight:         Intake/Output Summary (Last 24 hours) at 07/20/2021 1847 Last data filed at 07/20/2021 1601 Gross per 24 hour  Intake 1100 ml  Output 1050 ml  Net 50 ml    11/29 1901 - 12/01 0700 In: -  Out: Tok [Urine:1650]  Filed Weights   07/17/21 2100  Weight: 114.3 kg    Data Reviewed: Basic Metabolic Panel: Recent Labs  Lab 07/17/21 1101 07/18/21 0300 07/19/21 0217 07/20/21 0137  NA 132* 135 132* 134*  K 3.9 3.7 3.9 4.8  CL 100 104 103 103  CO2 24 24 24 25   GLUCOSE 160* 132* 131* 127*  BUN 16 18 20 17   CREATININE 1.26* 1.34* 1.28* 0.97  CALCIUM 9.6 8.3* 8.4* 8.6*   Liver Function Tests: Recent Labs  Lab 07/17/21 1101 07/18/21 0300 07/19/21 0217 07/20/21 0137  AST 21 21 29  34  ALT 19 19 28 25   ALKPHOS 55 48 52 50  BILITOT 1.8* 1.2 1.0 1.8*  PROT 7.2 5.8* 5.7* 5.5*  ALBUMIN 3.4* 2.7* 2.4* 2.3*   Recent Labs  Lab 07/17/21 1101 07/20/21 0137  LIPASE 33 46   No results for input(s): AMMONIA in the last 168 hours. CBC: Recent Labs  Lab 07/17/21 1101 07/18/21 0300 07/19/21 0217 07/20/21 0137  WBC 25.5* 20.0* 14.8* 10.7*  NEUTROABS 23.2*  --   --    --   HGB 13.5 11.5* 11.0* 10.9*  HCT 40.9 35.0* 33.3* 33.5*  MCV 93.8 94.3 95.4 93.8  PLT 240 214 244 269   Cardiac Enzymes: No results for input(s): CKTOTAL, CKMB, CKMBINDEX, TROPONINI in the last 168 hours. BNP (last 3 results) No results for input(s): BNP in the last 8760 hours.  ProBNP (last 3 results) No results for input(s): PROBNP in the last 8760 hours.  CBG: Recent Labs  Lab 07/19/21 1559 07/19/21 2101 07/20/21 0725 07/20/21 1128 07/20/21 1557  GLUCAP 112* 101* 116* 157* 126*       Radiology Reports  DG Chest Port 1V same Day  Result Date: 07/20/2021 CLINICAL DATA:  Shortness of breath and wheezing, chest pain EXAM: PORTABLE CHEST 1 VIEW COMPARISON:  Chest radiograph dated July 17, 2021 FINDINGS: The heart is mildly enlarged. Atherosclerotic calcification of aortic arch, unchanged. Redemonstration of right lower lobe opacity, which have slightly increased from prior examination concerning for atelectasis/pneumonia. Small right pleural effusion. No appreciable pneumothorax. IMPRESSION: 1. The right lower lobe opacity concerning for pneumonia/atelectasis with small pleural effusion, increased in size since prior examination. Follow-up examination to resolution is recommended. 2.  Stable cardiomegaly. Electronically Signed   By: Keane Police D.O.   On: 07/20/2021 10:46   MR ABDOMEN MRCP W WO CONTAST  Result Date: 07/20/2021 CLINICAL DATA:  Right upper quadrant abdominal pain EXAM: MRI ABDOMEN WITHOUT AND WITH CONTRAST (INCLUDING MRCP) TECHNIQUE: Multiplanar multisequence MR imaging of the abdomen was performed both before and after the administration of intravenous contrast. Heavily T2-weighted images of the biliary and pancreatic ducts were obtained, and three-dimensional MRCP images were rendered by post processing. CONTRAST:  56mL GADAVIST GADOBUTROL 1 MMOL/ML IV SOLN COMPARISON:  07/17/2021 FINDINGS: Exam detail is diminished due to motion artifact. This particularly  affects the postcontrast images. Lower chest: No acute findings. Hepatobiliary: Diffuse hepatic steatosis. 1.1 cm, well-circumscribed T2 hyperintense structure within segment 2 of the left hepatic lobe is identified compatible with a simple cyst, image 28/6. The gallbladder appears moderately distended. No wall thickening. Mild pericholecystic fluid. No gallstones identified. The common bile duct has a normal caliber. No signs of choledocholithiasis. Pancreas: There is mild pancreatic edema with peripancreatic fluid surrounding the head and neck of pancreas concerning for acute pancreatitis. No main duct dilatation or mass identified. Spleen:  Within normal limits in size and appearance. Adrenals/Urinary Tract: Normal adrenal glands. 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 No hydronephrosis identified bilaterally. Stomach/Bowel: Visualized portions within the abdomen are unremarkable. Vascular/Lymphatic: Aortic atherosclerosis without aneurysm. Upper abdominal vascularity appears patent. No abdominal adenopathy. Other: Small volume of peripancreatic ascites identified. No focal fluid collections identified. Musculoskeletal: No suspicious bone lesions identified. IMPRESSION: 1. Mild pancreatic edema with peripancreatic fluid surrounding the head and neck of pancreas concerning for acute pancreatitis. No main duct dilatation or mass  identified. 2. No gallstones or evidence of choledocholithiasis. 3. Mild hepatic steatosis. 4. Right kidney angiomyolipoma. Electronically Signed   By: Kerby Moors M.D.   On: 07/20/2021 06:30       Antibiotics: Anti-infectives (From admission, onward)    Start     Dose/Rate Route Frequency Ordered Stop   07/18/21 1600  vancomycin (VANCOREADY) IVPB 1000 mg/200 mL        1,000 mg 200 mL/hr over 60 Minutes Intravenous Every 24 hours 07/17/21 1444     07/17/21 2100   piperacillin-tazobactam (ZOSYN) IVPB 3.375 g        3.375 g 12.5 mL/hr over 240 Minutes Intravenous Every 8 hours 07/17/21 1755     07/17/21 1445  vancomycin (VANCOREADY) IVPB 1750 mg/350 mL        1,750 mg 175 mL/hr over 120 Minutes Intravenous  Once 07/17/21 1444 07/17/21 1732   07/17/21 1200  piperacillin-tazobactam (ZOSYN) IVPB 3.375 g        3.375 g 100 mL/hr over 30 Minutes Intravenous  Once 07/17/21 1159 07/17/21 1312         DVT prophylaxis: Lovenox  Code Status: Full code  Family Communication: No family at bedside   Consultants:   Procedures:     Objective    Physical Examination:   General-appears in no acute distress Heart-S1-S2, regular, no murmur auscultated Lungs-bilateral rhonchi auscultated Abdomen-soft, RUQ tenderness to palpation, no organomegaly Extremities-no edema in the lower extremities Neuro-alert, oriented x3, no focal deficit noted  Status is: Inpatient  Dispo: The patient is from: Home              Anticipated d/c is to: To be decided              Anticipated d/c date is: 07/24/2021              Patient currently not stable for discharge  Barrier to discharge-ongoing management for acute hypoxemic respiratory failure, right upper quadrant pain  COVID-19 Labs  No results for input(s): DDIMER, FERRITIN, LDH, CRP in the last 72 hours.  Lab Results  Component Value Date   [place] NEGATIVE 07/17/2021            Recent Results (from the past 240 hour(s))  Blood culture (routine x 2)     Status: None (Preliminary result)   Collection Time: 07/17/21 12:30 PM   Specimen: BLOOD RIGHT ARM  Result Value Ref Range Status   Specimen Description BLOOD RIGHT ARM  Final   Special Requests   Final    BOTTLES DRAWN AEROBIC AND ANAEROBIC Blood Culture adequate volume   Culture   Final    NO GROWTH 3 DAYS Performed at [facility], [address], [place], [place] [address]    Report Status PENDING  Incomplete  Blood  culture (routine x 2)     Status: None (Preliminary result)   Collection Time: 07/17/21 12:41 PM   Specimen: BLOOD  Result Value Ref Range Status   Specimen Description BLOOD SITE NOT SPECIFIED  Final   Special Requests   Final    BOTTLES DRAWN AEROBIC AND ANAEROBIC Blood Culture adequate volume   Culture   Final    NO GROWTH 3 DAYS Performed at [facility], [address] [place], [place] [address]    Report Status PENDING  Incomplete  Resp Panel by RT-PCR (Flu A&B, Covid) Nasopharyngeal Swab     Status: None   Collection Time: 07/17/21  3:35 PM  Specimen: Nasopharyngeal Swab; Nasopharyngeal(NP) swabs in vial transport medium  Result Value Ref Range Status   SARS Coronavirus 2 by RT PCR NEGATIVE NEGATIVE Final    Comment: (NOTE) SARS-CoV-2 target nucleic acids are NOT DETECTED.  The SARS-CoV-2 RNA is generally detectable in upper respiratory specimens during the acute phase of infection. The lowest concentration of SARS-CoV-2 viral copies this assay can detect is 138 copies/mL. A negative result does not preclude SARS-Cov-2 infection and should not be used as the sole basis for treatment or other patient management decisions. A negative result may occur with  improper specimen collection/handling, submission of specimen other than nasopharyngeal swab, presence of viral mutation(s) within the areas targeted by this assay, and inadequate number of viral copies(<138 copies/mL). A negative result must be combined with clinical observations, patient history, and epidemiological information. The expected result is Negative.  Fact Sheet for Patients:  EntrepreneurPulse.com.au  Fact Sheet for Healthcare Providers:  IncredibleEmployment.be  This test is no t yet approved or cleared by the [place] FDA and  has been authorized for detection and/or diagnosis of SARS-CoV-2 by FDA under an Emergency Use Authorization (EUA). This EUA will  remain  in effect (meaning this test can be used) for the duration of the COVID-19 declaration under Section 564(b)(1) of the Act, 21 U.S.C.section 360bbb-3(b)(1), unless the authorization is terminated  or revoked sooner.       Influenza A by PCR NEGATIVE NEGATIVE Final   Influenza B by PCR NEGATIVE NEGATIVE Final    Comment: (NOTE) The Xpert Xpress SARS-CoV-2/FLU/RSV plus assay is intended as an aid in the diagnosis of influenza from Nasopharyngeal swab specimens and should not be used as a sole basis for treatment. Nasal washings and aspirates are unacceptable for Xpert Xpress SARS-CoV-2/FLU/RSV testing.  Fact Sheet for Patients: EntrepreneurPulse.com.au  Fact Sheet for Healthcare Providers: IncredibleEmployment.be  This test is not yet approved or cleared by the [place] FDA and has been authorized for detection and/or diagnosis of SARS-CoV-2 by FDA under an Emergency Use Authorization (EUA). This EUA will remain in effect (meaning this test can be used) for the duration of the COVID-19 declaration under Section 564(b)(1) of the Act, 21 U.S.C. section 360bbb-3(b)(1), unless the authorization is terminated or revoked.  Performed at [facility], [place] [address], [place], [place] [address]   Respiratory (~20 pathogens) panel by PCR     Status: None   Collection Time: 07/17/21  9:52 PM   Specimen: Nasopharyngeal Swab; Respiratory  Result Value Ref Range Status   Adenovirus NOT DETECTED NOT DETECTED Final   Coronavirus 229E NOT DETECTED NOT DETECTED Final    Comment: (NOTE) The Coronavirus on the Respiratory Panel, DOES NOT test for the novel  Coronavirus (2019 nCoV)    Coronavirus HKU1 NOT DETECTED NOT DETECTED Final   Coronavirus NL63 NOT DETECTED NOT DETECTED Final   Coronavirus OC43 NOT DETECTED NOT DETECTED Final   Metapneumovirus NOT DETECTED NOT DETECTED Final   Rhinovirus / Enterovirus NOT DETECTED NOT DETECTED Final    Influenza A NOT DETECTED NOT DETECTED Final   Influenza B NOT DETECTED NOT DETECTED Final   Parainfluenza Virus 1 NOT DETECTED NOT DETECTED Final   Parainfluenza Virus 2 NOT DETECTED NOT DETECTED Final   Parainfluenza Virus 3 NOT DETECTED NOT DETECTED Final   Parainfluenza Virus 4 NOT DETECTED NOT DETECTED Final   Respiratory Syncytial Virus NOT DETECTED NOT DETECTED Final   Bordetella pertussis NOT DETECTED NOT DETECTED Final   Bordetella Parapertussis NOT  DETECTED NOT DETECTED Final   Chlamydophila pneumoniae NOT DETECTED NOT DETECTED Final   Mycoplasma pneumoniae NOT DETECTED NOT DETECTED Final    Comment: Performed at [facility], [place] [address] [place], [place] [address]  MRSA Next Gen by PCR, Nasal     Status: None   Collection Time: 07/17/21 11:05 PM   Specimen: Nasal Mucosa; Nasal Swab  Result Value Ref Range Status   MRSA by PCR Next Gen NOT DETECTED NOT DETECTED Final    Comment: (NOTE) The GeneXpert MRSA Assay (FDA approved for NASAL specimens only), is one component of a comprehensive MRSA colonization surveillance program. It is not intended to diagnose MRSA infection nor to guide or monitor treatment for MRSA infections. Test performance is not FDA approved in patients less than 87 years old. Performed at [facility], [place] [address], [place], [place] [address]     [facility] If 7PM-7AM, please contact night-coverage at [email or web address], Office  [phone]   07/20/2021, 6:47 PM  LOS: 3 days

## 2021-07-20 NOTE — Progress Notes (Signed)
Jade Sanchez 12:51 PM  Subjective: Patient seen and examined and discussed with her son and another family member and she is doing better wants to try to eat and we discussed her MRI findings and pancreatitis and answered all their questions and again she is not on any aspirin or nonsteroidals at home and does not drink no family history of pancreatitis  Objective: Vital signs stable afebrile no acute distress abdomen is soft nontender labs okay MRI reviewed  Assessment: Pancreatitis questionable etiology currently improved  Plan: Okay to advance diet we will proceed with an endoscopy tomorrow morning and the risk benefits methods of that was discussed and we compared to the colonoscopy she has had and he is eating well and doing well can possibly go home afterward and have outpatient GYN follow-up which we discussed  [facility]  office [phone] After 5PM or if no answer call [phone]

## 2021-07-20 NOTE — Progress Notes (Signed)
Subjective: CC: Patient with 2/10 RUQ abdominal pain. Tolerating cld without n/v. Some sob this morning.   Objective: Vital signs in last 24 hours: Temp:  [98 F (36.7 C)-98.5 F (36.9 C)] 98.5 F (36.9 C) (12/01 0727) Pulse Rate:  [60-66] 66 (12/01 0727) Resp:  [15-21] 20 (12/01 0727) BP: (100-127)/(42-54) 127/44 (12/01 0727) SpO2:  [91 %-95 %] 92 % (12/01 0727) Last BM Date: 07/16/21  Intake/Output from previous day: 11/30 0701 - 12/01 0700 In: -  Out: 1150 [Urine:1150] Intake/Output this shift: No intake/output data recorded.  PE: Gen:  Alert, NAD, pleasant Card:  Reg Pulm:  Expiratory wheezing. Mild tachypnea. On RA Abd: Soft, ND, NT. No RUQ or epigastric tenderness with light or deep palpation. Negative Murphy's sign. +BS Psych: A&Ox3  Skin: no rashes noted, warm and dry  Lab Results:  Recent Labs    07/19/21 0217 07/20/21 0137  WBC 14.8* 10.7*  HGB 11.0* 10.9*  HCT 33.3* 33.5*  PLT 244 269   BMET Recent Labs    07/19/21 0217 07/20/21 0137  NA 132* 134*  K 3.9 4.8  CL 103 103  CO2 24 25  GLUCOSE 131* 127*  BUN 20 17  CREATININE 1.28* 0.97  CALCIUM 8.4* 8.6*   PT/INR No results for input(s): LABPROT, INR in the last 72 hours. CMP     Component Value Date/Time   NA 134 (L) 07/20/2021 0137   K 4.8 07/20/2021 0137   CL 103 07/20/2021 0137   CO2 25 07/20/2021 0137   GLUCOSE 127 (H) 07/20/2021 0137   BUN 17 07/20/2021 0137   CREATININE 0.97 07/20/2021 0137   CALCIUM 8.6 (L) 07/20/2021 0137   PROT 5.5 (L) 07/20/2021 0137   ALBUMIN 2.3 (L) 07/20/2021 0137   AST 34 07/20/2021 0137   ALT 25 07/20/2021 0137   ALKPHOS 50 07/20/2021 0137   BILITOT 1.8 (H) 07/20/2021 0137   GFRNONAA 60 (L) 07/20/2021 0137   Lipase     Component Value Date/Time   LIPASE 46 07/20/2021 0137    Studies/Results: MR ABDOMEN MRCP W WO CONTAST  Result Date: 07/20/2021 CLINICAL DATA:  Right upper quadrant abdominal pain EXAM: MRI ABDOMEN WITHOUT AND WITH  CONTRAST (INCLUDING MRCP) TECHNIQUE: Multiplanar multisequence MR imaging of the abdomen was performed both before and after the administration of intravenous contrast. Heavily T2-weighted images of the biliary and pancreatic ducts were obtained, and three-dimensional MRCP images were rendered by post processing. CONTRAST:  16mL GADAVIST GADOBUTROL 1 MMOL/ML IV SOLN COMPARISON:  07/17/2021 FINDINGS: Exam detail is diminished due to motion artifact. This particularly affects the postcontrast images. Lower chest: No acute findings. Hepatobiliary: Diffuse hepatic steatosis. 1.1 cm, well-circumscribed T2 hyperintense structure within segment 2 of the left hepatic lobe is identified compatible with a simple cyst, image 28/6. The gallbladder appears moderately distended. No wall thickening. Mild pericholecystic fluid. No gallstones identified. The common bile duct has a normal caliber. No signs of choledocholithiasis. Pancreas: There is mild pancreatic edema with peripancreatic fluid surrounding the head and neck of pancreas concerning for acute pancreatitis. No main duct dilatation or mass identified. Spleen:  Within normal limits in size and appearance. Adrenals/Urinary Tract: Normal adrenal glands. 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 No hydronephrosis identified bilaterally. Stomach/Bowel: Visualized portions within the abdomen are unremarkable. Vascular/Lymphatic: Aortic atherosclerosis without aneurysm. Upper abdominal vascularity appears patent. No abdominal adenopathy. Other: Small volume of peripancreatic  ascites identified. No focal fluid collections identified. Musculoskeletal: No suspicious bone lesions identified. IMPRESSION: 1. Mild pancreatic edema with peripancreatic fluid surrounding the head and neck of pancreas concerning for acute pancreatitis. No main duct dilatation or mass identified. 2. No gallstones or  evidence of choledocholithiasis. 3. Mild hepatic steatosis. 4. Right kidney angiomyolipoma. Electronically Signed   By: Kerby Moors M.D.   On: 07/20/2021 06:30    Anti-infectives: Anti-infectives (From admission, onward)    Start     Dose/Rate Route Frequency Ordered Stop   07/18/21 1600  vancomycin (VANCOREADY) IVPB 1000 mg/200 mL        1,000 mg 200 mL/hr over 60 Minutes Intravenous Every 24 hours 07/17/21 1444     07/17/21 2100  piperacillin-tazobactam (ZOSYN) IVPB 3.375 g        3.375 g 12.5 mL/hr over 240 Minutes Intravenous Every 8 hours 07/17/21 1755     07/17/21 1445  vancomycin (VANCOREADY) IVPB 1750 mg/350 mL        1,750 mg 175 mL/hr over 120 Minutes Intravenous  Once 07/17/21 1444 07/17/21 1732   07/17/21 1200  piperacillin-tazobactam (ZOSYN) IVPB 3.375 g        3.375 g 100 mL/hr over 30 Minutes Intravenous  Once 07/17/21 1159 07/17/21 1312        Assessment/Plan RUQ abdominal pain  - RUQ [place] without stones visualized. HIDA scan negative for acute cholecystitis. MRCP w/ no gallstones or choledocholithiasis. There was mild pancreatic edema with peripancreatic fluid surrounding the head and neck of pancreas concerning for acute pancreatitis. Lipase wnl 3d ago and yesterday. GI is following. Her WBC continues to improve and is 10.7 this am. Afebrile. She is NT on exam this am and tolerating cld without n/v. No indication for emergency surgery. We will follow peripherally.    FEN: Ok for diet from our standpoint VTE: SCD's, okay for chemical prophylaxis from our standpoint ID: [email or web address]    CT AP with possible R PNA and small right pleural effusion. She is on broad spectrum abx. Pulm following.  CT A/P also with  7 cm R adnexal cystic mass, 9 cm solid left adnexal mass and thickened endometrium - recommend GYN vs GYN Onc eval HTN T2DM   LOS: 3 days    Jillyn Ledger , [facility] 07/20/2021, 10:09 AM Please see Amion for pager number during day  hours 7:00am-4:30pm

## 2021-07-20 NOTE — Care Management Important Message (Signed)
Important Message  Patient Details  Name: Jade Sanchez MRN: [id number] Date of Birth: [birth year]   Medicare Important Message Given:  Yes     Hannah Beat 07/20/2021, 2:10 PM

## 2021-07-21 ENCOUNTER — Encounter (HOSPITAL_COMMUNITY): Payer: Self-pay | Admitting: Family Medicine

## 2021-07-21 ENCOUNTER — Inpatient Hospital Stay (HOSPITAL_COMMUNITY): Payer: Medicare Other | Admitting: Anesthesiology

## 2021-07-21 ENCOUNTER — Inpatient Hospital Stay (HOSPITAL_COMMUNITY): Payer: Medicare Other

## 2021-07-21 ENCOUNTER — Encounter (HOSPITAL_COMMUNITY)
Admission: EM | Disposition: A | Discharge status: Home Health | Payer: Self-pay | Source: Home / Self Care | Attending: Family Medicine

## 2021-07-21 DIAGNOSIS — A419 Sepsis, unspecified organism: Secondary | ICD-10-CM | POA: Diagnosis not present

## 2021-07-21 DIAGNOSIS — R1011 Right upper quadrant pain: Secondary | ICD-10-CM | POA: Diagnosis not present

## 2021-07-21 DIAGNOSIS — I10 Essential (primary) hypertension: Secondary | ICD-10-CM | POA: Diagnosis not present

## 2021-07-21 DIAGNOSIS — K859 Acute pancreatitis without necrosis or infection, unspecified: Secondary | ICD-10-CM | POA: Diagnosis not present

## 2021-07-21 DIAGNOSIS — Z20822 Contact with and (suspected) exposure to covid-19: Secondary | ICD-10-CM | POA: Diagnosis not present

## 2021-07-21 DIAGNOSIS — J180 Bronchopneumonia, unspecified organism: Secondary | ICD-10-CM | POA: Diagnosis not present

## 2021-07-21 DIAGNOSIS — N1831 Chronic kidney disease, stage 3a: Secondary | ICD-10-CM | POA: Diagnosis not present

## 2021-07-21 DIAGNOSIS — R17 Unspecified jaundice: Secondary | ICD-10-CM | POA: Diagnosis not present

## 2021-07-21 LAB — GLUCOSE, CAPILLARY
Glucose-Capillary: 132 mg/dL — ABNORMAL HIGH (ref 70–99)
Glucose-Capillary: 125 mg/dL — ABNORMAL HIGH (ref 70–99)
Glucose-Capillary: 128 mg/dL — ABNORMAL HIGH (ref 70–99)
Glucose-Capillary: 144 mg/dL — ABNORMAL HIGH (ref 70–99)
Glucose-Capillary: 141 mg/dL — ABNORMAL HIGH (ref 70–99)

## 2021-07-21 LAB — BASIC METABOLIC PANEL
Anion gap: 5 (ref 5–15)
BUN: 10 mg/dL (ref 8–23)
CO2: 28 mmol/L (ref 22–32)
Calcium: 9 mg/dL (ref 8.9–10.3)
Chloride: 104 mmol/L (ref 98–111)
Creatinine, Ser: 1.01 mg/dL — ABNORMAL HIGH (ref 0.44–1.00)
GFR, Estimated: 57 mL/min — ABNORMAL LOW (ref >60)
Glucose, Bld: 156 mg/dL — ABNORMAL HIGH (ref 70–99)
Potassium: 4.3 mmol/L (ref 3.5–5.1)
Sodium: 137 mmol/L (ref 135–145)

## 2021-07-21 LAB — CBC
HCT: 34.5 % — ABNORMAL LOW (ref 36.0–46.0)
Hemoglobin: 11.6 g/dL — ABNORMAL LOW (ref 12.0–15.0)
MCH: 31.4 pg (ref 26.0–34.0)
MCHC: 33.6 g/dL (ref 30.0–36.0)
MCV: 93.2 fL (ref 80.0–100.0)
Platelets: 283 10*3/uL (ref 150–400)
RBC: 3.7 MIL/uL — ABNORMAL LOW (ref 3.87–5.11)
RDW: 13.1 % (ref 11.5–15.5)
WBC: 9.9 10*3/uL (ref 4.0–10.5)
nRBC: 0 % (ref 0.0–0.2)

## 2021-07-21 SURGERY — ESOPHAGOGASTRODUODENOSCOPY (EGD) WITH PROPOFOL
Anesthesia: Monitor Anesthesia Care

## 2021-07-21 MED ORDER — IPRATROPIUM-ALBUTEROL 0.5-2.5 (3) MG/3ML IN SOLN: 3.0000 mL | Freq: Four times a day (QID) | RESPIRATORY_TRACT | Status: DC | PRN

## 2021-07-21 MED ORDER — LACTATED RINGERS IV SOLN: INTRAVENOUS | Status: DC | PRN

## 2021-07-21 MED ORDER — IOHEXOL 350 MG/ML SOLN
50.0000 mL | Freq: Once | INTRAVENOUS | Status: AC | PRN
  Administered 2021-07-21: 50 mL via INTRAVENOUS

## 2021-07-21 MED ORDER — ONDANSETRON HCL 4 MG/2ML IJ SOLN: 4.0000 mg | Freq: Once | INTRAMUSCULAR | Status: DC | PRN

## 2021-07-21 MED ORDER — LIDOCAINE 2% (20 MG/ML) 5 ML SYRINGE
INTRAMUSCULAR | Status: DC | PRN
  Administered 2021-07-21: 60 mg via INTRAVENOUS

## 2021-07-21 MED ORDER — SODIUM CHLORIDE 0.9 % IV SOLN: INTRAVENOUS | Status: DC

## 2021-07-21 MED ORDER — PROPOFOL 500 MG/50ML IV EMUL
INTRAVENOUS | Status: DC | PRN
  Administered 2021-07-21: 100 ug/kg/min via INTRAVENOUS

## 2021-07-21 MED ORDER — PROPOFOL 10 MG/ML IV BOLUS
INTRAVENOUS | Status: DC | PRN
  Administered 2021-07-21 (×2): 20 mg via INTRAVENOUS

## 2021-07-21 SURGICAL SUPPLY — 14 items

## 2021-07-21 NOTE — Progress Notes (Addendum)
[facility]  PROGRESS NOTE  Jade Sanchez TIW:[id number] DOB: 1940-09-03 DOA: 07/17/2021 PCP: Lavone Orn, MD   Brief HPI:   80 year old female with history of diabetes mellitus type 2, hypertension, dyslipidemia, CKD stage III, primary hyperparathyroidism came to ED with severe right upper quadrant pain, nausea vomiting and fever.  Symptoms ongoing for 2 days. Work-up in the ED noted WBC of 25.5, T-max one 1.7, lipase 33, total bilirubin 1.8.  CT abdomen/pelvis noted gallbladder wall thickening, right middle lobe/lower lobe atelectasis with small pleural effusion and cystic adnexal masses bilaterally and heterogenous endometrial thickening.  Patient was started on IV antibiotics for sepsis  Subjective   Patient seen and examined, breathing has somewhat improved.  S/p EGD which was unremarkable.   Assessment/Plan:   Sepsis, POA -Patient presented with fever, right upper quadrant pain -S/p EGD which was unremarkable -CT abdomen pelvis showed dilated gallbladder only, additional finding in the adnexa and endometrium -Ultrasound abdomen showed gallbladder wall thickening at the fundus, HIDA scan completed showed patent cystic and bile ducts -MRI abdomen obtained yesterday showed mild pancreatitis and hepatic steatosis -Patient was started on empiric vancomycin and Zosyn, WBC down to 10,000    Right lower lobe pneumonia versus atelectasis -Chest x-ray obtained today shows worsening of opacity - CT chest with contrast ordered to further delineate the right lower lobe opacity; follow CT chest results -Continue vancomycin and Zosyn -Patient has bilateral rhonchi on auscultation, continue DuoNeb every 6 hours scheduled  Bilateral adnexal cystic masses/thickened endometrial lining -CT findings concerning for GYN malignancy -She will need gynecological evaluation once medically stable  Diabetes mellitus type 2 -Last hemoglobin A1c was 6.7 -Continue sliding scale  insulin with NovoLog -CBG well controlled  CKD stage IIIa -Creatinine stable at 0.97  Hypertension -Lasix and Micardis on hold -We will restart Lasix after CT chest with contrast        Medications     enoxaparin (LOVENOX) injection  40 mg Subcutaneous Q24H   guaiFENesin  600 mg Oral BID   insulin aspart  0-15 Units Subcutaneous TID WC   rosuvastatin  5 mg Oral Daily     Data Reviewed:   CBG:  Recent Labs  Lab 07/20/21 1557 07/20/21 2112 07/21/21 0742 07/21/21 1110 07/21/21 1149  GLUCAP 126* 167* 132* 125* 128*    SpO2: 95 % O2 Flow Rate (L/min): 2 L/min    Vitals:   07/21/21 0921 07/21/21 1110 07/21/21 1125 07/21/21 1147  BP: (!) 147/44 (!) 123/109 (!) 149/64 126/79  Pulse: 61 72 86 67  Resp: (!) 24 17 18 19   Temp: 97.7 F (36.5 C) 99.7 F (37.6 C) 99.7 F (37.6 C) 98.2 F (36.8 C)  TempSrc: Temporal   Oral  SpO2: 98% 94% 97% 95%  Weight: 111.1 kg     Height: 5\' 7"  (1.702 m)        Intake/Output Summary (Last 24 hours) at 07/21/2021 1535 Last data filed at 07/21/2021 0009 Gross per 24 hour  Intake 400 ml  Output 1300 ml  Net -900 ml    11/30 1901 - 12/02 0700 In: 1100 [P.O.:1100] Out: 1750 [Urine:1750]  Filed Weights   07/17/21 2100 07/21/21 0921  Weight: 114.3 kg 111.1 kg    Data Reviewed: Basic Metabolic Panel: Recent Labs  Lab 07/17/21 1101 07/18/21 0300 07/19/21 0217 07/20/21 0137 07/21/21 0525  NA 132* 135 132* 134* 137  K 3.9 3.7 3.9 4.8 4.3  CL 100 104 103 103 104  CO2 24 24 24  25  28  GLUCOSE 160* 132* 131* 127* 156*  BUN 16 18 20 17 10   CREATININE 1.26* 1.34* 1.28* 0.97 1.01*  CALCIUM 9.6 8.3* 8.4* 8.6* 9.0   Liver Function Tests: Recent Labs  Lab 07/17/21 1101 07/18/21 0300 07/19/21 0217 07/20/21 0137  AST 21 21 29  34  ALT 19 19 28 25   ALKPHOS 55 48 52 50  BILITOT 1.8* 1.2 1.0 1.8*  PROT 7.2 5.8* 5.7* 5.5*  ALBUMIN 3.4* 2.7* 2.4* 2.3*   Recent Labs  Lab 07/17/21 1101 07/20/21 0137  LIPASE 33 46    No results for input(s): AMMONIA in the last 168 hours. CBC: Recent Labs  Lab 07/17/21 1101 07/18/21 0300 07/19/21 0217 07/20/21 0137 07/21/21 0525  WBC 25.5* 20.0* 14.8* 10.7* 9.9  NEUTROABS 23.2*  --   --   --   --   HGB 13.5 11.5* 11.0* 10.9* 11.6*  HCT 40.9 35.0* 33.3* 33.5* 34.5*  MCV 93.8 94.3 95.4 93.8 93.2  PLT 240 214 244 269 283   Cardiac Enzymes: No results for input(s): CKTOTAL, CKMB, CKMBINDEX, TROPONINI in the last 168 hours. BNP (last 3 results) No results for input(s): BNP in the last 8760 hours.  ProBNP (last 3 results) No results for input(s): PROBNP in the last 8760 hours.  CBG: Recent Labs  Lab 07/20/21 1557 07/20/21 2112 07/21/21 0742 07/21/21 1110 07/21/21 1149  GLUCAP 126* 167* 132* 125* 128*       Radiology Reports  DG Chest Port 1V same Day  Result Date: 07/20/2021 CLINICAL DATA:  Shortness of breath and wheezing, chest pain EXAM: PORTABLE CHEST 1 VIEW COMPARISON:  Chest radiograph dated July 17, 2021 FINDINGS: The heart is mildly enlarged. Atherosclerotic calcification of aortic arch, unchanged. Redemonstration of right lower lobe opacity, which have slightly increased from prior examination concerning for atelectasis/pneumonia. Small right pleural effusion. No appreciable pneumothorax. IMPRESSION: 1. The right lower lobe opacity concerning for pneumonia/atelectasis with small pleural effusion, increased in size since prior examination. Follow-up examination to resolution is recommended. 2.  Stable cardiomegaly. Electronically Signed   By: Keane Police D.O.   On: 07/20/2021 10:46   MR ABDOMEN MRCP W WO CONTAST  Result Date: 07/20/2021 CLINICAL DATA:  Right upper quadrant abdominal pain EXAM: MRI ABDOMEN WITHOUT AND WITH CONTRAST (INCLUDING MRCP) TECHNIQUE: Multiplanar multisequence MR imaging of the abdomen was performed both before and after the administration of intravenous contrast. Heavily T2-weighted images of the biliary and  pancreatic ducts were obtained, and three-dimensional MRCP images were rendered by post processing. CONTRAST:  67mL GADAVIST GADOBUTROL 1 MMOL/ML IV SOLN COMPARISON:  07/17/2021 FINDINGS: Exam detail is diminished due to motion artifact. This particularly affects the postcontrast images. Lower chest: No acute findings. Hepatobiliary: Diffuse hepatic steatosis. 1.1 cm, well-circumscribed T2 hyperintense structure within segment 2 of the left hepatic lobe is identified compatible with a simple cyst, image 28/6. The gallbladder appears moderately distended. No wall thickening. Mild pericholecystic fluid. No gallstones identified. The common bile duct has a normal caliber. No signs of choledocholithiasis. Pancreas: There is mild pancreatic edema with peripancreatic fluid surrounding the head and neck of pancreas concerning for acute pancreatitis. No main duct dilatation or mass identified. Spleen:  Within normal limits in size and appearance. Adrenals/Urinary Tract: Normal adrenal glands. 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 No hydronephrosis identified bilaterally. Stomach/Bowel: Visualized portions within the abdomen are unremarkable. Vascular/Lymphatic: Aortic atherosclerosis without aneurysm. Upper abdominal vascularity appears  patent. No abdominal adenopathy. Other: Small volume of peripancreatic ascites identified. No focal fluid collections identified. Musculoskeletal: No suspicious bone lesions identified. IMPRESSION: 1. Mild pancreatic edema with peripancreatic fluid surrounding the head and neck of pancreas concerning for acute pancreatitis. No main duct dilatation or mass identified. 2. No gallstones or evidence of choledocholithiasis. 3. Mild hepatic steatosis. 4. Right kidney angiomyolipoma. Electronically Signed   By: Kerby Moors M.D.   On: 07/20/2021 06:30       Antibiotics: Anti-infectives (From  admission, onward)    Start     Dose/Rate Route Frequency Ordered Stop   07/18/21 1600  vancomycin (VANCOREADY) IVPB 1000 mg/200 mL        1,000 mg 200 mL/hr over 60 Minutes Intravenous Every 24 hours 07/17/21 1444     07/17/21 2100  piperacillin-tazobactam (ZOSYN) IVPB 3.375 g        3.375 g 12.5 mL/hr over 240 Minutes Intravenous Every 8 hours 07/17/21 1755     07/17/21 1445  vancomycin (VANCOREADY) IVPB 1750 mg/350 mL        1,750 mg 175 mL/hr over 120 Minutes Intravenous  Once 07/17/21 1444 07/17/21 1732   07/17/21 1200  piperacillin-tazobactam (ZOSYN) IVPB 3.375 g        3.375 g 100 mL/hr over 30 Minutes Intravenous  Once 07/17/21 1159 07/17/21 1312         DVT prophylaxis: Lovenox  Code Status: Full code  Family Communication: Discussed with patient's son at bedside   Consultants:   Procedures:     Objective    Physical Examination:   General-appears in no acute distress Heart-S1-S2, regular, no murmur auscultated Lungs-scattered rhonchi bilaterally Abdomen-soft, nontender, no organomegaly Extremities-no edema in the lower extremities Neuro-alert, oriented x3, no focal deficit noted  Status is: Inpatient  Dispo: The patient is from: Home              Anticipated d/c is to: To be decided              Anticipated d/c date is: 07/24/2021              Patient currently not stable for discharge  Barrier to discharge-ongoing management for acute hypoxemic respiratory failure, right upper quadrant pain  COVID-19 Labs  No results for input(s): DDIMER, FERRITIN, LDH, CRP in the last 72 hours.  Lab Results  Component Value Date   [place] NEGATIVE 07/17/2021            Recent Results (from the past 240 hour(s))  Blood culture (routine x 2)     Status: None (Preliminary result)   Collection Time: 07/17/21 12:30 PM   Specimen: BLOOD RIGHT ARM  Result Value Ref Range Status   Specimen Description BLOOD RIGHT ARM  Final   Special Requests    Final    BOTTLES DRAWN AEROBIC AND ANAEROBIC Blood Culture adequate volume   Culture   Final    NO GROWTH 4 DAYS Performed at [facility], [address], [place], [place] [address]    Report Status PENDING  Incomplete  Blood culture (routine x 2)     Status: None (Preliminary result)   Collection Time: 07/17/21 12:41 PM   Specimen: BLOOD  Result Value Ref Range Status   Specimen Description BLOOD SITE NOT SPECIFIED  Final   Special Requests   Final    BOTTLES DRAWN AEROBIC AND ANAEROBIC Blood Culture adequate volume   Culture   Final    NO GROWTH 4  DAYS Performed at [facility], [place] [address] [place], [place] [address]    Report Status PENDING  Incomplete  Resp Panel by RT-PCR (Flu A&B, Covid) Nasopharyngeal Swab     Status: None   Collection Time: 07/17/21  3:35 PM   Specimen: Nasopharyngeal Swab; Nasopharyngeal(NP) swabs in vial transport medium  Result Value Ref Range Status   SARS Coronavirus 2 by RT PCR NEGATIVE NEGATIVE Final    Comment: (NOTE) SARS-CoV-2 target nucleic acids are NOT DETECTED.  The SARS-CoV-2 RNA is generally detectable in upper respiratory specimens during the acute phase of infection. The lowest concentration of SARS-CoV-2 viral copies this assay can detect is 138 copies/mL. A negative result does not preclude SARS-Cov-2 infection and should not be used as the sole basis for treatment or other patient management decisions. A negative result may occur with  improper specimen collection/handling, submission of specimen other than nasopharyngeal swab, presence of viral mutation(s) within the areas targeted by this assay, and inadequate number of viral copies(<138 copies/mL). A negative result must be combined with clinical observations, patient history, and epidemiological information. The expected result is Negative.  Fact Sheet for Patients:  EntrepreneurPulse.com.au  Fact Sheet for Healthcare Providers:   IncredibleEmployment.be  This test is no t yet approved or cleared by the [place] FDA and  has been authorized for detection and/or diagnosis of SARS-CoV-2 by FDA under an Emergency Use Authorization (EUA). This EUA will remain  in effect (meaning this test can be used) for the duration of the COVID-19 declaration under Section 564(b)(1) of the Act, 21 U.S.C.section 360bbb-3(b)(1), unless the authorization is terminated  or revoked sooner.       Influenza A by PCR NEGATIVE NEGATIVE Final   Influenza B by PCR NEGATIVE NEGATIVE Final    Comment: (NOTE) The Xpert Xpress SARS-CoV-2/FLU/RSV plus assay is intended as an aid in the diagnosis of influenza from Nasopharyngeal swab specimens and should not be used as a sole basis for treatment. Nasal washings and aspirates are unacceptable for Xpert Xpress SARS-CoV-2/FLU/RSV testing.  Fact Sheet for Patients: EntrepreneurPulse.com.au  Fact Sheet for Healthcare Providers: IncredibleEmployment.be  This test is not yet approved or cleared by the [place] FDA and has been authorized for detection and/or diagnosis of SARS-CoV-2 by FDA under an Emergency Use Authorization (EUA). This EUA will remain in effect (meaning this test can be used) for the duration of the COVID-19 declaration under Section 564(b)(1) of the Act, 21 U.S.C. section 360bbb-3(b)(1), unless the authorization is terminated or revoked.  Performed at [facility], [place] [address], [place], [place] [address]   Respiratory (~20 pathogens) panel by PCR     Status: None   Collection Time: 07/17/21  9:52 PM   Specimen: Nasopharyngeal Swab; Respiratory  Result Value Ref Range Status   Adenovirus NOT DETECTED NOT DETECTED Final   Coronavirus 229E NOT DETECTED NOT DETECTED Final    Comment: (NOTE) The Coronavirus on the Respiratory Panel, DOES NOT test for the novel  Coronavirus (2019 nCoV)    Coronavirus  HKU1 NOT DETECTED NOT DETECTED Final   Coronavirus NL63 NOT DETECTED NOT DETECTED Final   Coronavirus OC43 NOT DETECTED NOT DETECTED Final   Metapneumovirus NOT DETECTED NOT DETECTED Final   Rhinovirus / Enterovirus NOT DETECTED NOT DETECTED Final   Influenza A NOT DETECTED NOT DETECTED Final   Influenza B NOT DETECTED NOT DETECTED Final   Parainfluenza Virus 1 NOT DETECTED NOT DETECTED Final   Parainfluenza Virus 2 NOT  DETECTED NOT DETECTED Final   Parainfluenza Virus 3 NOT DETECTED NOT DETECTED Final   Parainfluenza Virus 4 NOT DETECTED NOT DETECTED Final   Respiratory Syncytial Virus NOT DETECTED NOT DETECTED Final   Bordetella pertussis NOT DETECTED NOT DETECTED Final   Bordetella Parapertussis NOT DETECTED NOT DETECTED Final   Chlamydophila pneumoniae NOT DETECTED NOT DETECTED Final   Mycoplasma pneumoniae NOT DETECTED NOT DETECTED Final    Comment: Performed at [facility], [place] [address], [place], [place] [address]  MRSA Next Gen by PCR, Nasal     Status: None   Collection Time: 07/17/21 11:05 PM   Specimen: Nasal Mucosa; Nasal Swab  Result Value Ref Range Status   MRSA by PCR Next Gen NOT DETECTED NOT DETECTED Final    Comment: (NOTE) The GeneXpert MRSA Assay (FDA approved for NASAL specimens only), is one component of a comprehensive MRSA colonization surveillance program. It is not intended to diagnose MRSA infection nor to guide or monitor treatment for MRSA infections. Test performance is not FDA approved in patients less than 59 years old. Performed at [facility], [place] [address], [place],  [address]     [facility] If 7PM-7AM, please contact night-coverage at [email or web address], Office  [phone]   07/21/2021, 3:35 PM  LOS: 4 days

## 2021-07-21 NOTE — Progress Notes (Signed)
Jade Sanchez 10:41 AM  Subjective: Patient without any new complaints did eat some solid food yesterday and we rediscussed the procedure  Objective: Vital signs stable afebrile no acute distress exam please see preassessment evaluation labs reviewed  Assessment: Pancreatitis questionable etiology  Plan: K to proceed with endoscopy to rule out peptic ulcer disease with anesthesia assistance  [facility]  office [phone] After 5PM or if no answer call [phone]

## 2021-07-21 NOTE — Transfer of Care (Signed)
Immediate Anesthesia Transfer of Care Note  Patient: Jade Sanchez  Procedure(s) Performed: ESOPHAGOGASTRODUODENOSCOPY (EGD) WITH PROPOFOL  Patient Location: PACU  Anesthesia Type:MAC  Level of Consciousness: awake and alert   Airway & Oxygen Therapy: Patient Spontanous Breathing and Patient connected to nasal cannula oxygen  Post-op Assessment: Report given to RN and Post -op Vital signs reviewed and stable  Post vital signs: Reviewed and stable  Last Vitals:  Vitals Value Taken Time  BP 123/109 07/21/21 1110  Temp    Pulse 73 07/21/21 1110  Resp 19 07/21/21 1110  SpO2 96 % 07/21/21 1110  Vitals shown include unvalidated device data.  Last Pain:  Vitals:   07/21/21 0921  TempSrc: Temporal  PainSc: 0-No pain      Patients Stated Pain Goal: 2 (63/78/58 8502)  Complications: No notable events documented.

## 2021-07-21 NOTE — Anesthesia Procedure Notes (Signed)
Procedure Name: MAC Date/Time: 07/21/2021 10:42 AM Performed by: Ardyth Harps, CRNA Pre-anesthesia Checklist: Emergency Drugs available, Suction available, Patient identified, Patient being monitored and Timeout performed Patient Re-evaluated:Patient Re-evaluated prior to induction Oxygen Delivery Method: Nasal cannula

## 2021-07-21 NOTE — Anesthesia Postprocedure Evaluation (Signed)
Anesthesia Post Note  Patient: Jade Sanchez  Procedure(s) Performed: ESOPHAGOGASTRODUODENOSCOPY (EGD) WITH PROPOFOL     Patient location during evaluation: PACU Anesthesia Type: MAC Level of consciousness: awake and alert Pain management: pain level controlled Vital Signs Assessment: post-procedure vital signs reviewed and stable Respiratory status: spontaneous breathing, nonlabored ventilation and respiratory function stable Cardiovascular status: stable and blood pressure returned to baseline Postop Assessment: no apparent nausea or vomiting Anesthetic complications: no   No notable events documented.  Last Vitals:  Vitals:   07/21/21 1147 07/21/21 1610  BP: 126/79 121/79  Pulse: 67 76  Resp: 19 20  Temp: 36.8 C 36.8 C  SpO2: 95% 96%    Last Pain:  Vitals:   07/21/21 1610  TempSrc: Oral  PainSc:                  Catalina Gravel

## 2021-07-21 NOTE — Anesthesia Preprocedure Evaluation (Signed)
Anesthesia Evaluation  Patient identified by MRN, date of birth, ID band Patient awake    Reviewed: Allergy & Precautions, NPO status , Patient's Chart, lab work & pertinent test results  Airway Mallampati: II  TM Distance: >3 FB Neck ROM: Full    Dental  (+) Teeth Intact, Dental Advisory Given, Caps,    Pulmonary former smoker,    Pulmonary exam normal breath sounds clear to auscultation       Cardiovascular hypertension, Pt. on medications Normal cardiovascular exam Rhythm:Regular Rate:Normal     Neuro/Psych negative neurological ROS  negative psych ROS   GI/Hepatic Neg liver ROS, GERD  ,Pancreatitis   Endo/Other  diabetes, Type 2Obesity   Renal/GU Renal InsufficiencyRenal disease     Musculoskeletal negative musculoskeletal ROS (+)   Abdominal   Peds  Hematology negative hematology ROS (+)   Anesthesia Other Findings Day of surgery medications reviewed with the patient.  Reproductive/Obstetrics                             Anesthesia Physical Anesthesia Plan  ASA: 3  Anesthesia Plan: MAC   Post-op Pain Management:    Induction: Intravenous  PONV Risk Score and Plan: 2 and Propofol infusion and Treatment may vary due to age or medical condition  Airway Management Planned: Natural Airway and Nasal Cannula  Additional Equipment:   Intra-op Plan:   Post-operative Plan:   Informed Consent: I have reviewed the patients History and Physical, chart, labs and discussed the procedure including the risks, benefits and alternatives for the proposed anesthesia with the patient or authorized representative who has indicated his/her understanding and acceptance.     Dental advisory given  Plan Discussed with: CRNA  Anesthesia Plan Comments:         Anesthesia Quick Evaluation

## 2021-07-21 NOTE — Op Note (Signed)
[facility] Patient Name: Jade Sanchez Procedure Date : 07/21/2021 MRN: [id number] Attending MD: Clarene Essex , MD Date of Birth: 02-02-1941 CSN: [id number] Age: 80 Admit Type: Inpatient Procedure:                Upper GI endoscopy Indications:              Abdominal pain in the right upper quadrant,                            Abnormal CT of the GI tract possible pancreatitis                            want a rule out peptic ulcer disease Providers:                Clarene Essex, MD, Elmer Ramp. Tilden Dome, RN, [employer]                            Technician, Technician, [employer], Merchant navy officer Referring MD:              Medicines:                Propofol total dose 185 mg IV 60 mg IV lidocaine Complications:            No immediate complications. Estimated Blood Loss:     Estimated blood loss: none. Procedure:                Pre-Anesthesia Assessment:                           - Prior to the procedure, a History and Physical                            was performed, and patient medications and                            allergies were reviewed. The patient's tolerance of                            previous anesthesia was also reviewed. The risks                            and benefits of the procedure and the sedation                            options and risks were discussed with the patient.                            All questions were answered, and informed consent                            was obtained. Prior Anticoagulants: The patient has                            taken no previous anticoagulant or antiplatelet  agents. ASA Grade Assessment: III - A patient with                            severe systemic disease. After reviewing the risks                            and benefits, the patient was deemed in                            satisfactory condition to undergo the procedure.                           After obtaining informed consent,  the endoscope was                            passed under direct vision. Throughout the                            procedure, the patient's blood pressure, pulse, and                            oxygen saturations were monitored continuously. The                            [id number]-[id number] ([id number]) Olympus endoscope was introduced                            through the mouth, and advanced to the third part                            of duodenum. The upper GI endoscopy was                            accomplished without difficulty. The patient                            tolerated the procedure well. Scope In: Scope Out: Findings:      The larynx was normal.      A Tiny hiatal hernia was present.      The entire examined stomach was normal.      The duodenal bulb, first portion of the duodenum, second portion of the       duodenum and third portion of the duodenum were normal.      The exam was otherwise without abnormality. Impression:               - Normal larynx.                           - Tiny hiatal hernia.                           - Normal stomach.                           - Normal duodenal bulb, first portion of the  duodenum, second portion of the duodenum and third                            portion of the duodenum.                           - The examination was otherwise normal.                           - No specimens collected. Recommendation:           - Soft diet today. Hopefully can go home soon and                            have outpatient GYN work-up                           - Continue present medications.                           - Return to GI clinic PRN.                           - Telephone GI clinic if symptomatic PRN. Please                            call my partner Dr. Therisa Doyne this weekend if any                            question or problems from a GI standpoint Procedure Code(s):        --- Professional ---                            [phone], Esophagogastroduodenoscopy, flexible,                            transoral; diagnostic, including collection of                            specimen(s) by brushing or washing, when performed                            (separate procedure) Diagnosis Code(s):        --- Professional ---                           K44.9, Diaphragmatic hernia without obstruction or                            gangrene                           R10.11, Right upper quadrant pain                           R93.3, Abnormal findings on diagnostic imaging of  other parts of digestive tract CPT copyright 2019 American Medical Association. All rights reserved. The codes documented in this report are preliminary and upon coder review may  be revised to meet current compliance requirements. Clarene Essex, MD 07/21/2021 11:02:51 AM This report has been signed electronically. Number of Addenda: 0

## 2021-07-22 DIAGNOSIS — A419 Sepsis, unspecified organism: Secondary | ICD-10-CM | POA: Diagnosis not present

## 2021-07-22 LAB — CULTURE, BLOOD (ROUTINE X 2)
Culture: NO GROWTH
Culture: NO GROWTH
Special Requests: ADEQUATE
Special Requests: ADEQUATE

## 2021-07-22 LAB — GLUCOSE, CAPILLARY
Glucose-Capillary: 145 mg/dL — ABNORMAL HIGH (ref 70–99)
Glucose-Capillary: 153 mg/dL — ABNORMAL HIGH (ref 70–99)
Glucose-Capillary: 136 mg/dL — ABNORMAL HIGH (ref 70–99)
Glucose-Capillary: 140 mg/dL — ABNORMAL HIGH (ref 70–99)

## 2021-07-22 MED ORDER — VANCOMYCIN HCL 1250 MG/250ML IV SOLN
1250.0000 mg | INTRAVENOUS | Status: DC
  Administered 2021-07-22 – 2021-07-23 (×3): 1250 mg via INTRAVENOUS
  Filled 2021-07-22 (×2): qty 250

## 2021-07-22 MED ORDER — MOMETASONE FURO-FORMOTEROL FUM 200-5 MCG/ACT IN AERO
2.0000 | INHALATION_SPRAY | Freq: Two times a day (BID) | RESPIRATORY_TRACT | Status: DC
  Administered 2021-07-22 – 2021-07-26 (×7): 2 via RESPIRATORY_TRACT
  Filled 2021-07-22 (×2): qty 8.8

## 2021-07-22 MED ORDER — IPRATROPIUM-ALBUTEROL 0.5-2.5 (3) MG/3ML IN SOLN: 3.0000 mL | Freq: Four times a day (QID) | RESPIRATORY_TRACT | Status: DC | PRN

## 2021-07-22 MED ORDER — FUROSEMIDE 10 MG/ML IJ SOLN
20.0000 mg | Freq: Once | INTRAMUSCULAR | Status: AC
  Administered 2021-07-22: 20 mg via INTRAVENOUS
  Filled 2021-07-22: qty 2

## 2021-07-22 NOTE — Evaluation (Addendum)
Physical Therapy Evaluation Patient Details Name: Jade Sanchez MRN: [id number] DOB: 02/24/1941 Today's Date: 07/22/2021  History of Present Illness  80 y/o female presented to ED on 11/28 for RUQ pain and fever. CT abdomen/pelvis showed R middle lobe atelectasis vs pneumonia with small R pleural effusion. Found to be in sepsis in setting of possible pneumonia vs atelectasis in R middle lobe. PMH: HTN, DM  Clinical Impression  Patient admitted with above diagnosis. Patient presents with generalized weakness, decreased activity tolerance, impaired balance, and impaired functional mobility. Patient requires modA for bed mobility and sit to stand transfer and minA for step pivot transfer to recliner. VSS on RA throughout. Educated patient on use of flutter valve, patient demonstrated understanding. Patient will benefit from skilled PT services during acute stay to address listed deficits. Recommend SNF at this time to maximize functional independence. Will assess and adjust recommendations as patient progresses.        Recommendations for follow up therapy are one component of a multi-disciplinary discharge planning process, led by the attending physician.  Recommendations may be updated based on patient status, additional functional criteria and insurance authorization.  Follow Up Recommendations Skilled nursing-short term rehab (<3 hours/day)    Assistance Recommended at Discharge Frequent or constant Supervision/Assistance  Functional Status Assessment Patient has had a recent decline in their functional status and demonstrates the ability to make significant improvements in function in a reasonable and predictable amount of time.  Equipment Recommendations  Rolling Bernal Luhman (2 wheels);BSC/3in1    Recommendations for Other Services OT consult     Precautions / Restrictions Precautions Precautions: Fall Restrictions Weight Bearing Restrictions: No      Mobility  Bed  Mobility Overal bed mobility: Needs Assistance Bed Mobility: Supine to Sit     Supine to sit: Mod assist     General bed mobility comments: modA for trunk elevation    Transfers Overall transfer level: Needs assistance Equipment used: Rolling Kimon Loewen (2 wheels) Transfers: Sit to/from Stand;Bed to chair/wheelchair/BSC Sit to Stand: Mod assist   Step pivot transfers: Min assist       General transfer comment: modA to rise from low bed surface. MinA to perform step pivot transfer. Difficulty advancing feet initially due to weakness    Ambulation/Gait                  Stairs            Wheelchair Mobility    Modified Rankin (Stroke Patients Only)       Balance Overall balance assessment: Needs assistance Sitting-balance support: Bilateral upper extremity supported;Feet supported Sitting balance-Leahy Scale: Fair     Standing balance support: Bilateral upper extremity supported;Reliant on assistive device for balance Standing balance-Leahy Scale: Poor                               Pertinent Vitals/Pain Pain Assessment: No/denies pain    Home Living Family/patient expects to be discharged to:: Private residence Living Arrangements: Alone Available Help at Discharge: Family Type of Home: House Home Access: Stairs to enter   Technical brewer of Steps: 2   Home Layout: One level Home Equipment: Rollator (4 wheels)      Prior Function Prior Level of Function : Independent/Modified Independent;Driving             Mobility Comments: uses rollator for community distance       Hand Dominance  Extremity/Trunk Assessment   Upper Extremity Assessment Upper Extremity Assessment: Generalized weakness    Lower Extremity Assessment Lower Extremity Assessment: Generalized weakness    Cervical / Trunk Assessment Cervical / Trunk Assessment: Kyphotic  Communication   Communication: No difficulties  Cognition  Arousal/Alertness: Awake/alert Behavior During Therapy: WFL for tasks assessed/performed Overall Cognitive Status: Within Functional Limits for tasks assessed                                          General Comments General comments (skin integrity, edema, etc.): VSS on RA throughout    Exercises     Assessment/Plan    PT Assessment Patient needs continued PT services  PT Problem List Decreased strength;Decreased activity tolerance;Decreased balance;Decreased mobility;Decreased knowledge of use of DME;Cardiopulmonary status limiting activity       PT Treatment Interventions Gait training;DME instruction;Stair training;Therapeutic exercise;Therapeutic activities;Functional mobility training;Balance training;Patient/family education    PT Goals (Current goals can be found in the Care Plan section)  Acute Rehab PT Goals Patient Stated Goal: to get stronger and be independent PT Goal Formulation: With patient Time For Goal Achievement: 08/05/21 Potential to Achieve Goals: Good    Frequency Min 3X/week   Barriers to discharge        Co-evaluation               AM-PAC PT "6 Clicks" Mobility  Outcome Measure Help needed turning from your back to your side while in a flat bed without using bedrails?: A Little Help needed moving from lying on your back to sitting on the side of a flat bed without using bedrails?: A Lot Help needed moving to and from a bed to a chair (including a wheelchair)?: A Little Help needed standing up from a chair using your arms (e.g., wheelchair or bedside chair)?: A Lot Help needed to walk in hospital room?: A Lot Help needed climbing 3-5 steps with a railing? : Total 6 Click Score: 13    End of Session Equipment Utilized During Treatment: Gait belt Activity Tolerance: Patient tolerated treatment well Patient left: in chair;with call bell/phone within reach;with family/visitor present Nurse Communication: Mobility status PT  Visit Diagnosis: Unsteadiness on feet (R26.81);Muscle weakness (generalized) (M62.81);Other abnormalities of gait and mobility (R26.89)    Time: 5993-5701 PT Time Calculation (min) (ACUTE ONLY): 56 min   Charges:   PT Evaluation $PT Eval Moderate Complexity: 1 Mod PT Treatments $Therapeutic Activity: 38-52 mins        Lenice Koper A. Gilford Rile PT, DPT Acute Rehabilitation Services Pager [phone] Office [phone]   Linna Hoff 07/22/2021, 6:12 PM

## 2021-07-22 NOTE — Progress Notes (Signed)
CPT not done, Pt C/O pain.

## 2021-07-22 NOTE — Plan of Care (Signed)

## 2021-07-22 NOTE — Progress Notes (Signed)
PROGRESS NOTE    Jade Sanchez  WRU:[id number] DOB: 1941/05/30 DOA: 07/17/2021 PCP: Lavone Orn, MD    Brief Narrative:  80 years old female with past medical history of diabetes mellitus type 2 hypertension dyslipidemia chronic kidney disease stage III primary hyperparathyroidism came with a chief complaint of right upper quadrant pain vomiting and fever for the last 2 days Patient has sepsis criteria and was started empirically on Zosyn and vancomycin. He had a work-up for right upper quadrant abdominal pain with an ultrasound and HIDA scan which was negative for cholecystitis CT abdomen and pelvis showed cystic adnexal masses bilaterally and heterogeneous endometrial thickening.   Assessment & Plan:  Sepsis resolved Secondary to  pneumonia Blood cultures, lactic acid, negative, respiratory panel negative On Zosyn and vancomycin  Right middle lobe bronchopneumonia On CT of the chest Continue Zosyn and vancomycin Patient was wheezing DuoNebs and Dulera Oxygen, chest PT, incentive spirometry May consult pulmonology and infectious diseaseif not improving  Right upper quadrant abdominal pain HIDA scan negative for acute cholecystitis Ultrasound gallbladder wall thickening in the fundus MRI abdomen obtained yesterday showed mild pancreatitis and hepatic steatosis  Bilateral adnexal cystic masses, thickened endometrial lining CT finding concerning for malignancy Gynecologic examination was stable  Diabetes mellitus type 2 Sliding scale  Chronic kidney disease stage III Stable  Hypertension Lasix and Micardis on hold for CT chest Who can restart Lasix now    Principal Problem:   Sepsis ([place]) Active Problems:   Chronic kidney disease, stage 3a ([place])   Essential hypertension   Hypercholesterolemia   Type 2 diabetes mellitus ([place])   Pelvic mass   Elevated bilirubin   RUQ pain   Hyponatremia      DVT prophylaxis: Lovenox Code Status: Full  code Family Communication no family in the room will come this afternoon  Disposition Plan: Depends on the evaluation   Consultants:    Procedures:      Subjective: Shortness of breath, bilateral wheezing  Objective: Vitals:   07/21/21 2310 07/22/21 0319 07/22/21 0710 07/22/21 1100  BP: (!) 148/65 (!) 132/49 (!) 155/71 125/90  Pulse: 65  60 67  Resp: 18  20 20   Temp:  98.8 F (37.1 C) 98.1 F (36.7 C) 98.5 F (36.9 C)  TempSrc: Oral Oral Oral Oral  SpO2: 94%  96% 96%  Weight:      Height:        Intake/Output Summary (Last 24 hours) at 07/22/2021 1427 Last data filed at 07/22/2021 9147 Gross per 24 hour  Intake 120 ml  Output 550 ml  Net -430 ml   Filed Weights   07/17/21 2100 07/21/21 0921  Weight: 114.3 kg 111.1 kg    Examination:  General exam: Appears calm and comfortable  Respiratory system: Bilateral wheezing Cardiovascular system: S1 & S2 heard, RRR. No JVD, murmurs, rubs, gallops or clicks. No pedal edema. Gastrointestinal system: Abdomen is nondistended, soft and nontender. No organomegaly or masses felt. Normal bowel sounds heard. Central nervous system: Alert and oriented. No focal neurological deficits. Extremities: Symmetric 5 x 5 power. Skin: No rashes, lesions or ulcers Psychiatry: Judgement and insight appear normal. Mood & affect appropriate.     Data Reviewed: I have personally reviewed following labs and imaging studies  CBC: Recent Labs  Lab 07/17/21 1101 07/18/21 0300 07/19/21 0217 07/20/21 0137 07/21/21 0525  WBC 25.5* 20.0* 14.8* 10.7* 9.9  NEUTROABS 23.2*  --   --   --   --   HGB 13.5 11.5*  11.0* 10.9* 11.6*  HCT 40.9 35.0* 33.3* 33.5* 34.5*  MCV 93.8 94.3 95.4 93.8 93.2  PLT 240 214 244 269 756   Basic Metabolic Panel: Recent Labs  Lab 07/17/21 1101 07/18/21 0300 07/19/21 0217 07/20/21 0137 07/21/21 0525  NA 132* 135 132* 134* 137  K 3.9 3.7 3.9 4.8 4.3  CL 100 104 103 103 104  CO2 24 24 24 25 28   GLUCOSE  160* 132* 131* 127* 156*  BUN 16 18 20 17 10   CREATININE 1.26* 1.34* 1.28* 0.97 1.01*  CALCIUM 9.6 8.3* 8.4* 8.6* 9.0   GFR: Estimated Creatinine Clearance: 59 mL/min (A) (by C-G formula based on SCr of 1.01 mg/dL (H)). Liver Function Tests: Recent Labs  Lab 07/17/21 1101 07/18/21 0300 07/19/21 0217 07/20/21 0137  AST 21 21 29  34  ALT 19 19 28 25   ALKPHOS 55 48 52 50  BILITOT 1.8* 1.2 1.0 1.8*  PROT 7.2 5.8* 5.7* 5.5*  ALBUMIN 3.4* 2.7* 2.4* 2.3*   Recent Labs  Lab 07/17/21 1101 07/20/21 0137  LIPASE 33 46   No results for input(s): AMMONIA in the last 168 hours. Coagulation Profile: No results for input(s): INR, PROTIME in the last 168 hours. Cardiac Enzymes: No results for input(s): CKTOTAL, CKMB, CKMBINDEX, TROPONINI in the last 168 hours. BNP (last 3 results) No results for input(s): PROBNP in the last 8760 hours. HbA1C: No results for input(s): HGBA1C in the last 72 hours. CBG: Recent Labs  Lab 07/21/21 1149 07/21/21 1609 07/21/21 2105 07/22/21 0810 07/22/21 1156  GLUCAP 128* 144* 141* 145* 153*   Lipid Profile: No results for input(s): CHOL, HDL, LDLCALC, TRIG, CHOLHDL, LDLDIRECT in the last 72 hours. Thyroid Function Tests: No results for input(s): TSH, T4TOTAL, FREET4, T3FREE, THYROIDAB in the last 72 hours. Anemia Panel: No results for input(s): VITAMINB12, FOLATE, FERRITIN, TIBC, IRON, RETICCTPCT in the last 72 hours. Sepsis Labs: Recent Labs  Lab 07/17/21 1208 07/17/21 1308 07/17/21 2044 07/18/21 0300 07/19/21 0217  PROCALCITON  --   --  0.73 0.79 0.77  LATICACIDVEN 1.4 1.5  --   --   --     Recent Results (from the past 240 hour(s))  Blood culture (routine x 2)     Status: None   Collection Time: 07/17/21 12:30 PM   Specimen: BLOOD RIGHT ARM  Result Value Ref Range Status   Specimen Description BLOOD RIGHT ARM  Final   Special Requests   Final    BOTTLES DRAWN AEROBIC AND ANAEROBIC Blood Culture adequate volume   Culture   Final     NO GROWTH 5 DAYS Performed at [facility], [place] [address] [place], [place] [address]    Report Status 07/22/2021 FINAL  Final  Blood culture (routine x 2)     Status: None   Collection Time: 07/17/21 12:41 PM   Specimen: BLOOD  Result Value Ref Range Status   Specimen Description BLOOD SITE NOT SPECIFIED  Final   Special Requests   Final    BOTTLES DRAWN AEROBIC AND ANAEROBIC Blood Culture adequate volume   Culture   Final    NO GROWTH 5 DAYS Performed at [place] [address], [place], [place] [address]    Report Status 07/22/2021 FINAL  Final  Resp Panel by RT-PCR (Flu A&B, Covid) Nasopharyngeal Swab     Status: None   Collection Time: 07/17/21  3:35 PM   Specimen: Nasopharyngeal Swab; Nasopharyngeal(NP) swabs in vial transport medium  Result Value  Ref Range Status   SARS Coronavirus 2 by RT PCR NEGATIVE NEGATIVE Final    Comment: (NOTE) SARS-CoV-2 target nucleic acids are NOT DETECTED.  The SARS-CoV-2 RNA is generally detectable in upper respiratory specimens during the acute phase of infection. The lowest concentration of SARS-CoV-2 viral copies this assay can detect is 138 copies/mL. A negative result does not preclude SARS-Cov-2 infection and should not be used as the sole basis for treatment or other patient management decisions. A negative result may occur with  improper specimen collection/handling, submission of specimen other than nasopharyngeal swab, presence of viral mutation(s) within the areas targeted by this assay, and inadequate number of viral copies(<138 copies/mL). A negative result must be combined with clinical observations, patient history, and epidemiological information. The expected result is Negative.  Fact Sheet for Patients:  EntrepreneurPulse.com.au  Fact Sheet for Healthcare Providers:  IncredibleEmployment.be  This test is no t yet approved or cleared by the [place] FDA and  has  been authorized for detection and/or diagnosis of SARS-CoV-2 by FDA under an Emergency Use Authorization (EUA). This EUA will remain  in effect (meaning this test can be used) for the duration of the COVID-19 declaration under Section 564(b)(1) of the Act, 21 U.S.C.section 360bbb-3(b)(1), unless the authorization is terminated  or revoked sooner.       Influenza A by PCR NEGATIVE NEGATIVE Final   Influenza B by PCR NEGATIVE NEGATIVE Final    Comment: (NOTE) The Xpert Xpress SARS-CoV-2/FLU/RSV plus assay is intended as an aid in the diagnosis of influenza from Nasopharyngeal swab specimens and should not be used as a sole basis for treatment. Nasal washings and aspirates are unacceptable for Xpert Xpress SARS-CoV-2/FLU/RSV testing.  Fact Sheet for Patients: EntrepreneurPulse.com.au  Fact Sheet for Healthcare Providers: IncredibleEmployment.be  This test is not yet approved or cleared by the [place] FDA and has been authorized for detection and/or diagnosis of SARS-CoV-2 by FDA under an Emergency Use Authorization (EUA). This EUA will remain in effect (meaning this test can be used) for the duration of the COVID-19 declaration under Section 564(b)(1) of the Act, 21 U.S.C. section 360bbb-3(b)(1), unless the authorization is terminated or revoked.  Performed at [facility], [place] [address], [place], [place] [address]   Respiratory (~20 pathogens) panel by PCR     Status: None   Collection Time: 07/17/21  9:52 PM   Specimen: Nasopharyngeal Swab; Respiratory  Result Value Ref Range Status   Adenovirus NOT DETECTED NOT DETECTED Final   Coronavirus 229E NOT DETECTED NOT DETECTED Final    Comment: (NOTE) The Coronavirus on the Respiratory Panel, DOES NOT test for the novel  Coronavirus (2019 nCoV)    Coronavirus HKU1 NOT DETECTED NOT DETECTED Final   Coronavirus NL63 NOT DETECTED NOT DETECTED Final   Coronavirus OC43 NOT DETECTED  NOT DETECTED Final   Metapneumovirus NOT DETECTED NOT DETECTED Final   Rhinovirus / Enterovirus NOT DETECTED NOT DETECTED Final   Influenza A NOT DETECTED NOT DETECTED Final   Influenza B NOT DETECTED NOT DETECTED Final   Parainfluenza Virus 1 NOT DETECTED NOT DETECTED Final   Parainfluenza Virus 2 NOT DETECTED NOT DETECTED Final   Parainfluenza Virus 3 NOT DETECTED NOT DETECTED Final   Parainfluenza Virus 4 NOT DETECTED NOT DETECTED Final   Respiratory Syncytial Virus NOT DETECTED NOT DETECTED Final   Bordetella pertussis NOT DETECTED NOT DETECTED Final   Bordetella Parapertussis NOT DETECTED NOT DETECTED Final   Chlamydophila pneumoniae NOT DETECTED NOT DETECTED  Final   Mycoplasma pneumoniae NOT DETECTED NOT DETECTED Final    Comment: Performed at [facility], [place] [address] [place], [place] [address]  MRSA Next Gen by PCR, Nasal     Status: None   Collection Time: 07/17/21 11:05 PM   Specimen: Nasal Mucosa; Nasal Swab  Result Value Ref Range Status   MRSA by PCR Next Gen NOT DETECTED NOT DETECTED Final    Comment: (NOTE) The GeneXpert MRSA Assay (FDA approved for NASAL specimens only), is one component of a comprehensive MRSA colonization surveillance program. It is not intended to diagnose MRSA infection nor to guide or monitor treatment for MRSA infections. Test performance is not FDA approved in patients less than 4 years old. Performed at [facility], [place] [address] [place], [place] [address]          Radiology Studies: CT CHEST W CONTRAST  Result Date: 07/21/2021 CLINICAL DATA:  Chest pain and shortness of breath. EXAM: CT CHEST WITH CONTRAST TECHNIQUE: Multidetector CT imaging of the chest was performed during intravenous contrast administration. CONTRAST:  45mL OMNIPAQUE IOHEXOL 350 MG/ML SOLN COMPARISON:  Chest radiograph 07/20/2021 FINDINGS: Cardiovascular: No significant vascular findings. Normal heart size. No pericardial effusion.  Mediastinum/Nodes: No enlarged mediastinal, hilar, or axillary lymph nodes. Thyroid gland, trachea, and esophagus demonstrate no significant findings. Lungs/Pleura: Peribronchovascular consolidation with adjacent mild ground-glass opacities involves the entire lateral segment of the right middle lobe. Moderate right and trace left pleural effusions with adjacent compressive atelectasis in the lower lobes. Upper Abdomen: No acute abnormality. Musculoskeletal: No chest wall abnormality. No acute or significant osseous findings. IMPRESSION: 1. Bronchopneumonia involving the entire lateral segment of the right middle lobe. 2. Moderate right and trace left pleural effusions with adjacent compressive atelectasis in the lower lobes. Electronically Signed   By: Ileana Roup M.D.   On: 07/21/2021 15:58        Scheduled Meds:  enoxaparin (LOVENOX) injection  40 mg Subcutaneous Q24H   guaiFENesin  600 mg Oral BID   insulin aspart  0-15 Units Subcutaneous TID WC   mometasone-formoterol  2 puff Inhalation BID   rosuvastatin  5 mg Oral Daily   Continuous Infusions:  piperacillin-tazobactam (ZOSYN)  IV 3.375 g (07/22/21 1211)   vancomycin       LOS: 5 days    Time spent: More than 35 minutes    Assunta Found, MD [facility]   07/22/2021, 2:27 PM

## 2021-07-22 NOTE — Progress Notes (Signed)
Pharmacy Antibiotic Note  Jade Sanchez is a 80 y.o. female admitted on 07/17/2021 with pneumonia.  Pharmacy has been consulted for Vanco, Zosyn dosing.  ID:  - Sepsis - pneumonia on CT, HIDA neg for cholecystitis, WBC down to 9.9, Tmax 99.7 - 12/2: 1. Bronchopneumonia: entire lateral segment of the right middle lobe. 2. Moderate right and trace left pleural effusions with adjacent compressive atelectasis in the lower lobes.  11/28 BCx >> neg 11/28 Resp panel neg  11/28 MRSA PCR neg   Vanco 11/28>> Zosyn 11/28>>  Plan: Adjust Vanco with improved Scr to: Vancomycin 1250 mg IV Q 24 hrs. Goal AUC 400-550. Expected AUC: 506 SCr used: 1.01 Con't Zosyn 3.375g IV q8hr   Height: 5\' 7"  (170.2 cm) Weight: 111.1 kg (245 lb) IBW/kg (Calculated) : 61.6  Temp (24hrs), Avg:98.6 F (37 C), Min:97.7 F (36.5 C), Max:99.7 F (37.6 C)  Recent Labs  Lab 07/17/21 1101 07/17/21 1208 07/17/21 1308 07/18/21 0300 07/19/21 0217 07/20/21 0137 07/21/21 0525  WBC 25.5*  --   --  20.0* 14.8* 10.7* 9.9  CREATININE 1.26*  --   --  1.34* 1.28* 0.97 1.01*  LATICACIDVEN  --  1.4 1.5  --   --   --   --     Estimated Creatinine Clearance: 59 mL/min (A) (by C-G formula based on SCr of 1.01 mg/dL (H)).    No Known Allergies  Jade Sanchez S. Jade Sanchez, PharmD, BCPS Clinical Staff Pharmacist [email or web address]   Jade Sanchez 07/22/2021 8:53 AM

## 2021-07-23 ENCOUNTER — Encounter (HOSPITAL_COMMUNITY): Payer: Self-pay | Admitting: Gastroenterology

## 2021-07-23 ENCOUNTER — Inpatient Hospital Stay (HOSPITAL_COMMUNITY): Payer: Medicare Other

## 2021-07-23 DIAGNOSIS — A419 Sepsis, unspecified organism: Secondary | ICD-10-CM | POA: Diagnosis not present

## 2021-07-23 DIAGNOSIS — N1831 Chronic kidney disease, stage 3a: Secondary | ICD-10-CM | POA: Diagnosis not present

## 2021-07-23 DIAGNOSIS — R17 Unspecified jaundice: Secondary | ICD-10-CM | POA: Diagnosis not present

## 2021-07-23 DIAGNOSIS — I10 Essential (primary) hypertension: Secondary | ICD-10-CM | POA: Diagnosis not present

## 2021-07-23 LAB — BASIC METABOLIC PANEL
Anion gap: 7 (ref 5–15)
BUN: 8 mg/dL (ref 8–23)
CO2: 28 mmol/L (ref 22–32)
Calcium: 9.8 mg/dL (ref 8.9–10.3)
Chloride: 103 mmol/L (ref 98–111)
Creatinine, Ser: 0.85 mg/dL (ref 0.44–1.00)
GFR, Estimated: >60 mL/min (ref >60)
Glucose, Bld: 163 mg/dL — ABNORMAL HIGH (ref 70–99)
Potassium: 3.6 mmol/L (ref 3.5–5.1)
Sodium: 138 mmol/L (ref 135–145)

## 2021-07-23 LAB — CBC
HCT: 39.3 % (ref 36.0–46.0)
Hemoglobin: 13.2 g/dL (ref 12.0–15.0)
MCH: 30.6 pg (ref 26.0–34.0)
MCHC: 33.6 g/dL (ref 30.0–36.0)
MCV: 91.2 fL (ref 80.0–100.0)
Platelets: 484 10*3/uL — ABNORMAL HIGH (ref 150–400)
RBC: 4.31 MIL/uL (ref 3.87–5.11)
RDW: 12.9 % (ref 11.5–15.5)
WBC: 12.5 10*3/uL — ABNORMAL HIGH (ref 4.0–10.5)
nRBC: 0 % (ref 0.0–0.2)

## 2021-07-23 LAB — GLUCOSE, CAPILLARY
Glucose-Capillary: 139 mg/dL — ABNORMAL HIGH (ref 70–99)
Glucose-Capillary: 73 mg/dL (ref 70–99)
Glucose-Capillary: 132 mg/dL — ABNORMAL HIGH (ref 70–99)
Glucose-Capillary: 121 mg/dL — ABNORMAL HIGH (ref 70–99)

## 2021-07-23 LAB — URIC ACID: Uric Acid, Serum: 2.6 mg/dL (ref 2.5–7.1)

## 2021-07-23 MED ORDER — FUROSEMIDE 40 MG PO TABS
40.0000 mg | ORAL_TABLET | Freq: Every day | ORAL | Status: DC
  Administered 2021-07-23 – 2021-07-26 (×3): 40 mg via ORAL
  Filled 2021-07-23 (×4): qty 1

## 2021-07-23 MED ORDER — IRBESARTAN 75 MG PO TABS
75.0000 mg | ORAL_TABLET | Freq: Every day | ORAL | Status: DC
  Administered 2021-07-23 – 2021-07-24 (×2): 75 mg via ORAL
  Filled 2021-07-23 (×2): qty 1

## 2021-07-23 NOTE — Evaluation (Signed)
Occupational Therapy Evaluation Patient Details Name: Lonnetta Kniskern [facility] MRN: [id number] DOB: 1940-09-21 Today's Date: 07/23/2021   History of Present Illness 80 y/o female presented to ED on 11/28 for RUQ pain and fever. CT abdomen/pelvis showed R middle lobe atelectasis vs pneumonia with small R pleural effusion. Bilateral adnexal cystic masses, thickened endometrial lining  CT finding concerning for malignancy. Found to be in sepsis in setting of possible pneumonia vs atelectasis in R middle lobe. PMH: HTN, DM   Clinical Impression   PTA pt lives alone independently and is very active in her community. Able to mobilize to [facility] then ambulate @ 25 ft in room @ RW level with min guard A. Complaining of L foot/great toe discomfort.  Requires Mod A for LB ADL. At this time recommend rehab at [facility], however pt may progress to DC home if 24/7 assistance available. Will follow acutely.   Recommend pt mobilize to [facility] rather than use [employer] to increase strength and mobility     Recommendations for follow up therapy are one component of a multi-disciplinary discharge planning process, led by the attending physician.  Recommendations may be updated based on patient status, additional functional criteria and insurance authorization.   Follow Up Recommendations  Skilled nursing-short term rehab (<3 hours/day) (May progress to [facility] if family assistance available)    Assistance Recommended at Discharge Frequent or constant Supervision/Assistance  Functional Status Assessment  Patient has had a recent decline in their functional status and demonstrates the ability to make significant improvements in function in a reasonable and predictable amount of time.  Equipment Recommendations  BSC/3in1    Recommendations for Other Services       Precautions / Restrictions Precautions Precautions: Fall Precaution Comments: L foot pain      Mobility Bed Mobility               General bed  mobility comments: OOB in chair    Transfers Overall transfer level: Needs assistance Equipment used: Rolling walker (2 wheels) Transfers: Sit to/from Stand;Bed to chair/wheelchair/BSC Sit to Stand: Min guard     Step pivot transfers: Min guard            Balance Overall balance assessment: Needs assistance Sitting-balance support: Bilateral upper extremity supported;Feet supported Sitting balance-Leahy Scale: Good     Standing balance support: Bilateral upper extremity supported;Reliant on assistive device for balance Standing balance-Leahy Scale: Poor                             ADL either performed or assessed with clinical judgement   ADL Overall ADL's : Needs assistance/impaired     Grooming: Set up   Upper Body Bathing: Set up   Lower Body Bathing: Moderate assistance;Sit to/from stand   Upper Body Dressing : Set up   Lower Body Dressing: Moderate assistance;Sit to/from stand   Toilet Transfer: Minimal assistance;BSC/3in1   Toileting- Clothing Manipulation and Hygiene: Minimal assistance       Functional mobility during ADLs: Minimal assistance;Rolling walker (2 wheels)       Vision         Perception     Praxis      Pertinent Vitals/Pain Pain Assessment: Faces Faces Pain Scale: Hurts little more Pain Location: L foot Pain Descriptors / Indicators: Discomfort Pain Intervention(s): Limited activity within patient's tolerance     Hand Dominance Right   Extremity/Trunk Assessment Upper Extremity Assessment Upper Extremity Assessment: Generalized weakness  Lower Extremity Assessment Lower Extremity Assessment: Defer to PT evaluation (L foot pain/great toe area)   Cervical / Trunk Assessment Cervical / Trunk Assessment: Kyphotic   Communication Communication Communication: No difficulties   Cognition Arousal/Alertness: Awake/alert Behavior During Therapy: WFL for tasks assessed/performed Overall Cognitive Status: Within  Functional Limits for tasks assessed                                       General Comments  VSS on RA    Exercises Exercises: Other exercises Other Exercises Other Exercises: encouraged genral UE exercises; issued evel 1 theraband   Shoulder Instructions      Home Living Family/patient expects to be discharged to:: Private residence Living Arrangements: Alone Available Help at Discharge: Family Type of Home: House Home Access: Stairs to enter Technical brewer of Steps: 2   Home Layout: One level     Bathroom Shower/Tub: Occupational psychologist: Standard Bathroom Accessibility: Yes How Accessible: Accessible via walker Home Equipment: Rollator (4 wheels)          Prior Functioning/Environment Prior Level of Function : Independent/Modified Independent;Driving             Mobility Comments: uses rollator for community distance ADLs Comments: very active in the community; goes to the [employer]        OT Problem List: Decreased strength;Decreased activity tolerance;Impaired balance (sitting and/or standing);Decreased safety awareness;Decreased knowledge of use of DME or AE;Cardiopulmonary status limiting activity;Obesity;Pain      OT Treatment/Interventions: Self-care/ADL training;Therapeutic exercise;Neuromuscular education;Energy conservation;DME and/or AE instruction;Therapeutic activities;Patient/family education;Balance training    OT Goals(Current goals can be found in the care plan section) Acute Rehab OT Goals Patient Stated Goal: to get her strength back OT Goal Formulation: With patient Time For Goal Achievement: 08/06/21 Potential to Achieve Goals: Good  OT Frequency: Min 2X/week   Barriers to D/C:            Co-evaluation              AM-PAC OT "6 Clicks" Daily Activity     Outcome Measure Help from another person eating meals?: None Help from another person taking care of personal grooming?: A  Little Help from another person toileting, which includes using toliet, bedpan, or urinal?: A Little Help from another person bathing (including washing, rinsing, drying)?: A Lot Help from another person to put on and taking off regular upper body clothing?: A Little Help from another person to put on and taking off regular lower body clothing?: A Lot 6 Click Score: 17   End of Session Equipment Utilized During Treatment: Gait belt;Rolling walker (2 wheels) Nurse Communication: Mobility status  Activity Tolerance: Patient tolerated treatment well Patient left: in chair;with call bell/phone within reach  OT Visit Diagnosis: Unsteadiness on feet (R26.81);Muscle weakness (generalized) (M62.81);Pain Pain - Right/Left: Left Pain - part of body: Ankle and joints of foot                Time: 5885-0277 OT Time Calculation (min): 23 min Charges:  OT General Charges $OT Visit: 1 Visit OT Treatments $Self Care/Home Management : 8-22 mins  Maurie Boettcher, OT/L   Acute OT Clinical Specialist [place] Pager [phone] Office [phone]   [facility] 07/23/2021, 3:49 PM

## 2021-07-23 NOTE — Progress Notes (Addendum)
PROGRESS NOTE    Jade Sanchez  WEX:[id number] DOB: Aug 09, 1941 DOA: 07/17/2021 PCP: Lavone Orn, MD    Brief Narrative:   80 years old female with past medical history of diabetes mellitus type 2 hypertension dyslipidemia chronic kidney disease stage III primary hyperparathyroidism came with a chief complaint of right upper quadrant pain vomiting and fever for the last 2 days Patient has sepsis criteria and was started empirically on Zosyn and vancomycin. He had a work-up for right upper quadrant abdominal pain with an ultrasound and HIDA scan which was negative for cholecystitis CT abdomen and pelvis showed cystic adnexal masses bilaterally and heterogeneous endometrial thickening.    Assessment & Plan:  Sepsis resolved Secondary to  pneumonia Blood cultures, lactic acid, negative, respiratory panel negative On Zosyn and vancomycin   Right middle lobe bronchopneumonia On CT of the chest Continue Zosyn and vancomycin Patient was wheezing DuoNebs and Dulera Oxygen, chest PT, incentive spirometry May consult pulmonology and infectious diseaseif not improving  07/23/2021 Patient doing well no Eating in a chair, did need to walk a little bit in the room Pain left big toe no history of injury X-ray left foot and uric acid level rule out gout Evaluated by Education officer, museum and physical therapy Recommended SNF, family agree Follow-up with social worker in the morning    Right upper quadrant abdominal pain HIDA scan negative for acute cholecystitis Ultrasound gallbladder wall thickening in the fundus MRI abdomen obtained yesterday showed mild pancreatitis and hepatic steatosis Surgery consult no hepatobiliary etiology for her right upper quadrant abdominal pain, may be related to her right pneumonia   Bilateral adnexal cystic masses, thickened endometrial lining CT finding concerning for malignancy Gynecologic examination was stable   Diabetes mellitus type  2 Sliding scale   Chronic kidney disease stage III Stable   Hypertension Lasix and Micardis on hold for CT chest Who can restart Lasix now  Principal Problem:   Sepsis ([place]) Active Problems:   Chronic kidney disease, stage 3a ([place])   Essential hypertension   Hypercholesterolemia   Type 2 diabetes mellitus ([place])   Pelvic mass   Elevated bilirubin   RUQ pain   Hyponatremia      DVT prophylaxis: (Lovenox/ Code Status: (Full code Family Communication: (No family in the room today I discussed her status with her daughter yesterday Disposition Plan: Discharge to SNF  Consultants:  None    Antimicrobials:  On Zosyn and Vanco Will extend antibiotics for 10 days  Subjective: Less shortness of breath, doing well Pain left big toe  Objective: Vitals:   07/22/21 2315 07/23/21 0328 07/23/21 0800 07/23/21 0812  BP: (!) 168/71 (!) 175/74  (!) 152/74  Pulse: 72 78  76  Resp: 20 20  20   Temp: 98.6 F (37 C) 98.2 F (36.8 C)  98 F (36.7 C)  TempSrc: Oral Oral  Oral  SpO2: 95% 94% 95% 94%  Weight:      Height:        Intake/Output Summary (Last 24 hours) at 07/23/2021 1307 Last data filed at 07/23/2021 9381 Gross per 24 hour  Intake 15774.12 ml  Output 1000 ml  Net 14774.12 ml   Filed Weights   07/17/21 2100 07/21/21 0921  Weight: 114.3 kg 111.1 kg    Examination:  General exam: Appears calm and comfortable  Respiratory system: Clear to auscultation. Respiratory effort normal. Cardiovascular system: S1 & S2 heard, RRR. No JVD, murmurs, rubs, gallops or clicks. No pedal edema. Gastrointestinal system: Abdomen is  nondistended, soft and nontender. No organomegaly or masses felt. Normal bowel sounds heard. Central nervous system: Alert and oriented. No focal neurological deficits. Extremities: Symmetric 5 x 5 power. Skin: No rashes, lesions or ulcers Psychiatry: Judgement and insight appear normal. Mood & affect appropriate.     Data Reviewed: I have  personally reviewed following labs and imaging studies  CBC: Recent Labs  Lab 07/17/21 1101 07/18/21 0300 07/19/21 0217 07/20/21 0137 07/21/21 0525  WBC 25.5* 20.0* 14.8* 10.7* 9.9  NEUTROABS 23.2*  --   --   --   --   HGB 13.5 11.5* 11.0* 10.9* 11.6*  HCT 40.9 35.0* 33.3* 33.5* 34.5*  MCV 93.8 94.3 95.4 93.8 93.2  PLT 240 214 244 269 259   Basic Metabolic Panel: Recent Labs  Lab 07/17/21 1101 07/18/21 0300 07/19/21 0217 07/20/21 0137 07/21/21 0525  NA 132* 135 132* 134* 137  K 3.9 3.7 3.9 4.8 4.3  CL 100 104 103 103 104  CO2 24 24 24 25 28   GLUCOSE 160* 132* 131* 127* 156*  BUN 16 18 20 17 10   CREATININE 1.26* 1.34* 1.28* 0.97 1.01*  CALCIUM 9.6 8.3* 8.4* 8.6* 9.0   GFR: Estimated Creatinine Clearance: 59 mL/min (A) (by C-G formula based on SCr of 1.01 mg/dL (H)). Liver Function Tests: Recent Labs  Lab 07/17/21 1101 07/18/21 0300 07/19/21 0217 07/20/21 0137  AST 21 21 29  34  ALT 19 19 28 25   ALKPHOS 55 48 52 50  BILITOT 1.8* 1.2 1.0 1.8*  PROT 7.2 5.8* 5.7* 5.5*  ALBUMIN 3.4* 2.7* 2.4* 2.3*   Recent Labs  Lab 07/17/21 1101 07/20/21 0137  LIPASE 33 46   No results for input(s): AMMONIA in the last 168 hours. Coagulation Profile: No results for input(s): INR, PROTIME in the last 168 hours. Cardiac Enzymes: No results for input(s): CKTOTAL, CKMB, CKMBINDEX, TROPONINI in the last 168 hours. BNP (last 3 results) No results for input(s): PROBNP in the last 8760 hours. HbA1C: No results for input(s): HGBA1C in the last 72 hours. CBG: Recent Labs  Lab 07/22/21 1156 07/22/21 1621 07/22/21 2059 07/23/21 0817 07/23/21 1136  GLUCAP 153* 136* 140* 139* 73   Lipid Profile: No results for input(s): CHOL, HDL, LDLCALC, TRIG, CHOLHDL, LDLDIRECT in the last 72 hours. Thyroid Function Tests: No results for input(s): TSH, T4TOTAL, FREET4, T3FREE, THYROIDAB in the last 72 hours. Anemia Panel: No results for input(s): VITAMINB12, FOLATE, FERRITIN, TIBC,  IRON, RETICCTPCT in the last 72 hours. Sepsis Labs: Recent Labs  Lab 07/17/21 1208 07/17/21 1308 07/17/21 2044 07/18/21 0300 07/19/21 0217  PROCALCITON  --   --  0.73 0.79 0.77  LATICACIDVEN 1.4 1.5  --   --   --     Recent Results (from the past 240 hour(s))  Blood culture (routine x 2)     Status: None   Collection Time: 07/17/21 12:30 PM   Specimen: BLOOD RIGHT ARM  Result Value Ref Range Status   Specimen Description BLOOD RIGHT ARM  Final   Special Requests   Final    BOTTLES DRAWN AEROBIC AND ANAEROBIC Blood Culture adequate volume   Culture   Final    NO GROWTH 5 DAYS Performed at [facility], [place] [address] [place], [place] [address]    Report Status 07/22/2021 FINAL  Final  Blood culture (routine x 2)     Status: None   Collection Time: 07/17/21 12:41 PM   Specimen: BLOOD  Result Value Ref Range Status  Specimen Description BLOOD SITE NOT SPECIFIED  Final   Special Requests   Final    BOTTLES DRAWN AEROBIC AND ANAEROBIC Blood Culture adequate volume   Culture   Final    NO GROWTH 5 DAYS Performed at [facility], [address], [place], [place] [address]    Report Status 07/22/2021 FINAL  Final  Resp Panel by RT-PCR (Flu A&B, Covid) Nasopharyngeal Swab     Status: None   Collection Time: 07/17/21  3:35 PM   Specimen: Nasopharyngeal Swab; Nasopharyngeal(NP) swabs in vial transport medium  Result Value Ref Range Status   SARS Coronavirus 2 by RT PCR NEGATIVE NEGATIVE Final    Comment: (NOTE) SARS-CoV-2 target nucleic acids are NOT DETECTED.  The SARS-CoV-2 RNA is generally detectable in upper respiratory specimens during the acute phase of infection. The lowest concentration of SARS-CoV-2 viral copies this assay can detect is 138 copies/mL. A negative result does not preclude SARS-Cov-2 infection and should not be used as the sole basis for treatment or other patient management decisions. A negative result may occur with  improper specimen  collection/handling, submission of specimen other than nasopharyngeal swab, presence of viral mutation(s) within the areas targeted by this assay, and inadequate number of viral copies(<138 copies/mL). A negative result must be combined with clinical observations, patient history, and epidemiological information. The expected result is Negative.  Fact Sheet for Patients:  EntrepreneurPulse.com.au  Fact Sheet for Healthcare Providers:  IncredibleEmployment.be  This test is no t yet approved or cleared by the [place] FDA and  has been authorized for detection and/or diagnosis of SARS-CoV-2 by FDA under an Emergency Use Authorization (EUA). This EUA will remain  in effect (meaning this test can be used) for the duration of the COVID-19 declaration under Section 564(b)(1) of the Act, 21 U.S.C.section 360bbb-3(b)(1), unless the authorization is terminated  or revoked sooner.       Influenza A by PCR NEGATIVE NEGATIVE Final   Influenza B by PCR NEGATIVE NEGATIVE Final    Comment: (NOTE) The Xpert Xpress SARS-CoV-2/FLU/RSV plus assay is intended as an aid in the diagnosis of influenza from Nasopharyngeal swab specimens and should not be used as a sole basis for treatment. Nasal washings and aspirates are unacceptable for Xpert Xpress SARS-CoV-2/FLU/RSV testing.  Fact Sheet for Patients: EntrepreneurPulse.com.au  Fact Sheet for Healthcare Providers: IncredibleEmployment.be  This test is not yet approved or cleared by the [place] FDA and has been authorized for detection and/or diagnosis of SARS-CoV-2 by FDA under an Emergency Use Authorization (EUA). This EUA will remain in effect (meaning this test can be used) for the duration of the COVID-19 declaration under Section 564(b)(1) of the Act, 21 U.S.C. section 360bbb-3(b)(1), unless the authorization is terminated or revoked.  Performed at [facility], [place] [address] [place], [place] [address]   Respiratory (~20 pathogens) panel by PCR     Status: None   Collection Time: 07/17/21  9:52 PM   Specimen: Nasopharyngeal Swab; Respiratory  Result Value Ref Range Status   Adenovirus NOT DETECTED NOT DETECTED Final   Coronavirus 229E NOT DETECTED NOT DETECTED Final    Comment: (NOTE) The Coronavirus on the Respiratory Panel, DOES NOT test for the novel  Coronavirus (2019 nCoV)    Coronavirus HKU1 NOT DETECTED NOT DETECTED Final   Coronavirus NL63 NOT DETECTED NOT DETECTED Final   Coronavirus OC43 NOT DETECTED NOT DETECTED Final   Metapneumovirus NOT DETECTED NOT DETECTED Final   Rhinovirus /  Enterovirus NOT DETECTED NOT DETECTED Final   Influenza A NOT DETECTED NOT DETECTED Final   Influenza B NOT DETECTED NOT DETECTED Final   Parainfluenza Virus 1 NOT DETECTED NOT DETECTED Final   Parainfluenza Virus 2 NOT DETECTED NOT DETECTED Final   Parainfluenza Virus 3 NOT DETECTED NOT DETECTED Final   Parainfluenza Virus 4 NOT DETECTED NOT DETECTED Final   Respiratory Syncytial Virus NOT DETECTED NOT DETECTED Final   Bordetella pertussis NOT DETECTED NOT DETECTED Final   Bordetella Parapertussis NOT DETECTED NOT DETECTED Final   Chlamydophila pneumoniae NOT DETECTED NOT DETECTED Final   Mycoplasma pneumoniae NOT DETECTED NOT DETECTED Final    Comment: Performed at [facility], [place] [address] [place], [place] [address]  MRSA Next Gen by PCR, Nasal     Status: None   Collection Time: 07/17/21 11:05 PM   Specimen: Nasal Mucosa; Nasal Swab  Result Value Ref Range Status   MRSA by PCR Next Gen NOT DETECTED NOT DETECTED Final    Comment: (NOTE) The GeneXpert MRSA Assay (FDA approved for NASAL specimens only), is one component of a comprehensive MRSA colonization surveillance program. It is not intended to diagnose MRSA infection nor to guide or monitor treatment for MRSA infections. Test performance is not FDA approved in  patients less than 64 years old. Performed at [facility], [place] [address], [place], [place] [address]          Radiology Studies: CT CHEST W CONTRAST  Result Date: 07/21/2021 CLINICAL DATA:  Chest pain and shortness of breath. EXAM: CT CHEST WITH CONTRAST TECHNIQUE: Multidetector CT imaging of the chest was performed during intravenous contrast administration. CONTRAST:  9mL OMNIPAQUE IOHEXOL 350 MG/ML SOLN COMPARISON:  Chest radiograph 07/20/2021 FINDINGS: Cardiovascular: No significant vascular findings. Normal heart size. No pericardial effusion. Mediastinum/Nodes: No enlarged mediastinal, hilar, or axillary lymph nodes. Thyroid gland, trachea, and esophagus demonstrate no significant findings. Lungs/Pleura: Peribronchovascular consolidation with adjacent mild ground-glass opacities involves the entire lateral segment of the right middle lobe. Moderate right and trace left pleural effusions with adjacent compressive atelectasis in the lower lobes. Upper Abdomen: No acute abnormality. Musculoskeletal: No chest wall abnormality. No acute or significant osseous findings. IMPRESSION: 1. Bronchopneumonia involving the entire lateral segment of the right middle lobe. 2. Moderate right and trace left pleural effusions with adjacent compressive atelectasis in the lower lobes. Electronically Signed   By: Ileana Roup M.D.   On: 07/21/2021 15:58        Scheduled Meds:  enoxaparin (LOVENOX) injection  40 mg Subcutaneous Q24H   guaiFENesin  600 mg Oral BID   insulin aspart  0-15 Units Subcutaneous TID WC   mometasone-formoterol  2 puff Inhalation BID   rosuvastatin  5 mg Oral Daily   Continuous Infusions:  piperacillin-tazobactam (ZOSYN)  IV 3.375 g (07/23/21 0527)   vancomycin 1,250 mg (07/22/21 2027)     LOS: 6 days    Time spent:     Assunta Found, MD [facility]   If 7PM-7AM, please contact night-coverage [email or web address] Password [email or web address] 07/23/2021, 1:07 PM

## 2021-07-23 NOTE — TOC Initial Note (Signed)
Transition of Care [facility]) - Initial/Assessment Note    Patient Details  Name: Jade Sanchez MRN: [id number] Date of Birth: 1941-07-02  Transition [facility]) CM/SW Contact:    Joanne Chars, LCSW Phone Number: 07/23/2021, 12:38 PM  Clinical Narrative:   CSW met with pt and so Jade Sanchez to discuss DC recommendation for SNF.  Permission given to speak with son Jade Sanchez present and also to speak with daughter Jade Sanchez.  They are agreeable to SNF, choice document given.  Pt lives in [place], open to placement in [place], [place], or [place].  Permission given to send out referral in hub.  Pt is vaccinated for covid with 2 boosters.    Referral sent out in hub. [place] Must is down, still needs PASSR.                  Expected Discharge Plan: Skilled Nursing Facility Barriers to Discharge: Continued Medical Work up, SNF Pending bed offer   Patient Goals and CMS Choice Patient states their goals for this hospitalization and ongoing recovery are:: "get back to my active lifestyle" CMS Medicare.gov Compare Post Acute Care list provided to:: Patient Choice offered to / list presented to : Patient  Expected Discharge Plan and Services Expected Discharge Plan: [place] Choice: [place] arrangements for the past 2 months: Single Family Home                                      Prior Living Arrangements/Services Living arrangements for the past 2 months: Single Family Home Lives with:: Self Patient language and need for interpreter reviewed:: Yes Do you feel safe going back to the place where you live?: Yes      Need for Family Participation in Patient Care: Yes (Comment) Care giver support system in place?: Yes (comment) Current home services: Other (comment) Criminal Activity/Legal Involvement Pertinent to Current Situation/Hospitalization: No - Comment as needed  Activities of Daily Living      Permission  Sought/Granted Permission sought to share information with : Family Supports, Chartered certified accountant granted to share information with : Yes, Verbal Permission Granted  Share Information with NAME: daughter Jade Sanchez, son Jade Sanchez  Permission granted to share info w AGENCY: SNF        Emotional Assessment Appearance:: Appears stated age Attitude/Demeanor/Rapport: Engaged Affect (typically observed): Appropriate, Pleasant Orientation: : Oriented to Self, Oriented to Place, Oriented to  Time, Oriented to Situation Alcohol / Substance Use: Not Applicable Psych Involvement: No (comment)  Admission diagnosis:  Pneumonia [J18.9] Ovarian mass [N83.8] Right upper quadrant abdominal pain [R10.11] Sepsis ([place]) [A41.9] Sepsis, due to unspecified organism, unspecified whether acute organ dysfunction present [facility]) [A41.9] Patient Active Problem List   Diagnosis Date Noted   Chronic kidney disease, stage 3a ([place]) 07/17/2021   Essential hypertension 07/17/2021   Gastro-esophageal reflux disease without esophagitis 07/17/2021   Hypercholesterolemia 07/17/2021   Primary hyperparathyroidism ([place]) 07/17/2021   Type 2 diabetes mellitus ([place]) 07/17/2021   Sepsis ([place]) 07/17/2021   Pelvic mass 07/17/2021   Elevated bilirubin 07/17/2021   RUQ pain 07/17/2021   Hyponatremia 07/17/2021   PCP:  Lavone Orn, MD Pharmacy:   [facility] [place],  - [address] [place] [address] Phone: [phone] Fax: [phone]     Social  Determinants of Health (SDOH) Interventions    Readmission Risk Interventions No flowsheet data found.

## 2021-07-23 NOTE — Plan of Care (Signed)

## 2021-07-23 NOTE — NC FL2 (Signed)
[place] LEVEL OF CARE SCREENING TOOL     IDENTIFICATION  Patient Name: Jade Sanchez Birthdate: Jun 15, 1941 Sex: female Admission Date (Current Location): 07/17/2021  [facility] and [place] Number:  Herbalist and Address:  [facility], [place] [address] [place], [place] [address]      Provider Number: [id number]  Attending Physician Name and Address:  Cristescu, Linard Millers, MD  Relative Name and Phone Number:  [name] Daughter   [phone]    Current Level of Care: Hospital Recommended Level of Care: [place] Prior Approval Number:    Date Approved/Denied:   PASRR Number:    Discharge Plan: SNF    Current Diagnoses: Patient Active Problem List   Diagnosis Date Noted   Chronic kidney disease, stage 3a ([place]) 07/17/2021   Essential hypertension 07/17/2021   Gastro-esophageal reflux disease without esophagitis 07/17/2021   Hypercholesterolemia 07/17/2021   Primary hyperparathyroidism ([place]) 07/17/2021   Type 2 diabetes mellitus ([place]) 07/17/2021   Sepsis ([place]) 07/17/2021   Pelvic mass 07/17/2021   Elevated bilirubin 07/17/2021   RUQ pain 07/17/2021   Hyponatremia 07/17/2021    Orientation RESPIRATION BLADDER Height & Weight     Self, Time, Situation, Place  Normal Incontinent, External catheter Weight: 245 lb (111.1 kg) Height:  5\' 7"  (170.2 cm)  BEHAVIORAL SYMPTOMS/MOOD NEUROLOGICAL BOWEL NUTRITION STATUS      Incontinent Diet (see discharge summary)  AMBULATORY STATUS COMMUNICATION OF NEEDS Skin   Extensive Assist Verbally Normal                       Personal Care Assistance Level of Assistance  Bathing, Feeding, Dressing Bathing Assistance: Limited assistance Feeding assistance: Independent Dressing Assistance: Limited assistance     Functional Limitations Info  Sight, Hearing, Speech Sight Info: Adequate Hearing Info: Adequate Speech Info: Adequate    SPECIAL CARE  FACTORS FREQUENCY  PT (By licensed PT), OT (By licensed OT)     PT Frequency: 5x week OT Frequency: 5x week            Contractures Contractures Info: Not present    Additional Factors Info  Code Status, Allergies, Insulin Sliding Scale Code Status Info: full Allergies Info: NKA   Insulin Sliding Scale Info: Novolog, 0-15 units 3x day with meals.  See discharge summary.       Current Medications (07/23/2021):  This is the current hospital active medication list Current Facility-Administered Medications  Medication Dose Route Frequency Provider Last Rate Last Admin   acetaminophen (TYLENOL) tablet 650 mg  650 mg Oral Q6H PRN Clarene Essex, MD   650 mg at 07/23/21 0258   Or   acetaminophen (TYLENOL) suppository 650 mg  650 mg Rectal Q6H PRN Clarene Essex, MD       enoxaparin (LOVENOX) injection 40 mg  40 mg Subcutaneous Q24H Clarene Essex, MD   40 mg at 07/22/21 2134   guaiFENesin (MUCINEX) 12 hr tablet 600 mg  600 mg Oral BID Clarene Essex, MD   600 mg at 07/23/21 0950   HYDROmorphone (DILAUDID) injection 1 mg  1 mg Intravenous Q3H PRN Clarene Essex, MD   1 mg at 07/19/21 1706   insulin aspart (novoLOG) injection 0-15 Units  0-15 Units Subcutaneous TID WC Magod, Altamese Dilling, MD       ipratropium-albuterol (DUONEB) 0.5-2.5 (3) MG/3ML nebulizer solution 3 mL  3 mL Nebulization Q6H PRN Oswald Hillock, MD       ipratropium-albuterol (  DUONEB) 0.5-2.5 (3) MG/3ML nebulizer solution 3 mL  3 mL Nebulization Q6H PRN Cristescu, Mircea G, MD       mometasone-formoterol (DULERA) 200-5 MCG/ACT inhaler 2 puff  2 puff Inhalation BID Cristescu, Mircea G, MD   2 puff at 07/23/21 0800   ondansetron (ZOFRAN) tablet 4 mg  4 mg Oral Q6H PRN Clarene Essex, MD       Or   ondansetron (ZOFRAN) injection 4 mg  4 mg Intravenous Q6H PRN Clarene Essex, MD       oxyCODONE (Oxy IR/ROXICODONE) immediate release tablet 5 mg  5 mg Oral Q6H PRN Clarene Essex, MD   5 mg at 07/21/21 2235   piperacillin-tazobactam (ZOSYN) IVPB 3.375 g  3.375  g Intravenous Ernestina Penna, MD 12.5 mL/hr at 07/23/21 0527 3.375 g at 07/23/21 0527   rosuvastatin (CRESTOR) tablet 5 mg  5 mg Oral Daily Clarene Essex, MD   5 mg at 07/23/21 0950   vancomycin (VANCOREADY) IVPB 1250 mg/250 mL  1,250 mg Intravenous Q24H Karren Cobble, RPH 166.7 mL/hr at 07/22/21 2027 1,250 mg at 07/22/21 2027     Discharge Medications: Please see discharge summary for a list of discharge medications.  Relevant Imaging Results:  Relevant Lab Results:   Additional Information SSN: [id number].  Pt is vaccinated for covid with 2 boosters.  Joanne Chars, LCSW

## 2021-07-24 ENCOUNTER — Inpatient Hospital Stay (HOSPITAL_COMMUNITY): Payer: Medicare Other

## 2021-07-24 DIAGNOSIS — E119 Type 2 diabetes mellitus without complications: Secondary | ICD-10-CM

## 2021-07-24 DIAGNOSIS — R1900 Intra-abdominal and pelvic swelling, mass and lump, unspecified site: Secondary | ICD-10-CM | POA: Diagnosis not present

## 2021-07-24 DIAGNOSIS — R9389 Abnormal findings on diagnostic imaging of other specified body structures: Secondary | ICD-10-CM

## 2021-07-24 DIAGNOSIS — N1831 Chronic kidney disease, stage 3a: Secondary | ICD-10-CM | POA: Diagnosis not present

## 2021-07-24 DIAGNOSIS — A419 Sepsis, unspecified organism: Secondary | ICD-10-CM | POA: Diagnosis not present

## 2021-07-24 DIAGNOSIS — I10 Essential (primary) hypertension: Secondary | ICD-10-CM | POA: Diagnosis not present

## 2021-07-24 LAB — BASIC METABOLIC PANEL
Anion gap: 7 (ref 5–15)
BUN: 7 mg/dL — ABNORMAL LOW (ref 8–23)
CO2: 26 mmol/L (ref 22–32)
Calcium: 9 mg/dL (ref 8.9–10.3)
Chloride: 104 mmol/L (ref 98–111)
Creatinine, Ser: 0.86 mg/dL (ref 0.44–1.00)
GFR, Estimated: >60 mL/min (ref >60)
Glucose, Bld: 127 mg/dL — ABNORMAL HIGH (ref 70–99)
Potassium: 3.1 mmol/L — ABNORMAL LOW (ref 3.5–5.1)
Sodium: 137 mmol/L (ref 135–145)

## 2021-07-24 LAB — CBC
HCT: 38.4 % (ref 36.0–46.0)
Hemoglobin: 12.8 g/dL (ref 12.0–15.0)
MCH: 30.3 pg (ref 26.0–34.0)
MCHC: 33.3 g/dL (ref 30.0–36.0)
MCV: 91 fL (ref 80.0–100.0)
Platelets: 440 10*3/uL — ABNORMAL HIGH (ref 150–400)
RBC: 4.22 MIL/uL (ref 3.87–5.11)
RDW: 13.1 % (ref 11.5–15.5)
WBC: 11.1 10*3/uL — ABNORMAL HIGH (ref 4.0–10.5)
nRBC: 0 % (ref 0.0–0.2)

## 2021-07-24 LAB — GLUCOSE, CAPILLARY
Glucose-Capillary: 126 mg/dL — ABNORMAL HIGH (ref 70–99)
Glucose-Capillary: 167 mg/dL — ABNORMAL HIGH (ref 70–99)
Glucose-Capillary: 155 mg/dL — ABNORMAL HIGH (ref 70–99)
Glucose-Capillary: 115 mg/dL — ABNORMAL HIGH (ref 70–99)

## 2021-07-24 MED ORDER — IRBESARTAN 75 MG PO TABS
225.0000 mg | ORAL_TABLET | Freq: Once | ORAL | Status: AC
  Administered 2021-07-24: 225 mg via ORAL
  Filled 2021-07-24: qty 1

## 2021-07-24 MED ORDER — GADOBUTROL 1 MMOL/ML IV SOLN
10.0000 mL | Freq: Once | INTRAVENOUS | Status: AC | PRN
  Administered 2021-07-24: 10 mL via INTRAVENOUS

## 2021-07-24 MED ORDER — IRBESARTAN 300 MG PO TABS
300.0000 mg | ORAL_TABLET | Freq: Every day | ORAL | Status: DC
  Administered 2021-07-25 – 2021-07-26 (×2): 300 mg via ORAL
  Filled 2021-07-24 (×2): qty 1

## 2021-07-24 MED ORDER — LORAZEPAM 2 MG/ML IJ SOLN
1.0000 mg | Freq: Once | INTRAMUSCULAR | Status: AC
  Administered 2021-07-24: 1 mg via INTRAVENOUS
  Filled 2021-07-24: qty 1

## 2021-07-24 MED ORDER — POTASSIUM CHLORIDE CRYS ER 20 MEQ PO TBCR
40.0000 meq | EXTENDED_RELEASE_TABLET | ORAL | Status: AC
  Administered 2021-07-24 (×2): 40 meq via ORAL
  Filled 2021-07-24 (×2): qty 2

## 2021-07-24 NOTE — TOC Progression Note (Signed)
Transition of Care [facility]) - Progression Note    Patient Details  Name: Jade Sanchez MRN: [id number] Date of Birth: August 19, 1941  Transition of [facility]) CM/SW [place], [place] Phone Number: 07/24/2021, 3:49 PM  Clinical Narrative:    CSW met with pt at bedside and provided current bed offers. Pt noted her son and daughter were doing research on facilities, but they weren't sure yet if they wanted SNF or HH. She stated CSW could contact either one of them to provide bed offers. CSW reached out to daughter [name], bed offers given and all questions answered. TOC will continue to follow for DC planning.   Expected Discharge Plan: [place] Barriers to Discharge: Continued Medical Work up, SNF Pending bed offer  Expected Discharge Plan and Services Expected Discharge Plan: [place] Choice: [place] arrangements for the past 2 months: Single Family Home                                       Social Determinants of Health (SDOH) Interventions    Readmission Risk Interventions No flowsheet data found.

## 2021-07-24 NOTE — Progress Notes (Signed)
Patient ID: Jade Sanchez [facility], female   DOB: 01/10/1941, 80 y.o.   MRN: [id number]   Impression: Pelvic mass  Thickened endometrial lining  Recommendations: MRI to further characterize the mass Check CA 125 Outpatient endometrial sampling - will arrange to check for possible endometrial hyperplasia or carcinoma.  Reason for consult: Patient is a 80 y.o. G2P2 with 2 prior SVD and BTL who was admitted with sepsis and incidentally found to have endometrial thickening and bilateral pelvic masses on CT scan during the work-up. Patient reports menopause in mid 48s and no bleeding since. She denies pelvic pain or pressure. No h/o abnormal pap smears. No h/o ovarian cysts.  We are asked to see the patient regarding these findings and to complete the work-up.  Past Medical History:  Diagnosis Date   Diabetes mellitus without complication ([place])    Hypertension     Past Surgical History:  Procedure Laterality Date   BREAST BIOPSY Bilateral 1989   benign   BREAST EXCISIONAL BIOPSY Bilateral 1989   benign   ESOPHAGOGASTRODUODENOSCOPY (EGD) WITH PROPOFOL N/A 07/21/2021   Procedure: ESOPHAGOGASTRODUODENOSCOPY (EGD) WITH PROPOFOL;  Surgeon: Clarene Essex, MD;  Location: [place];  Service: Endoscopy;  Laterality: N/A;    History reviewed. No pertinent family history.  Social History   Socioeconomic History   Marital status: Divorced    Spouse name: Not on file   Number of children: Not on file   Years of education: Not on file   Highest education level: Not on file  Occupational History   Not on file  Tobacco Use   Smoking status: Never   Smokeless tobacco: Never  Vaping Use   Vaping Use: Never used  Substance and Sexual Activity   Alcohol use: Never   Drug use: Never   Sexual activity: Not on file  Other Topics Concern   Not on file  Social History Narrative   Not on file   Social Determinants of Health   Financial Resource Strain: Not on file  Food Insecurity:  Not on file  Transportation Needs: Not on file  Physical Activity: Not on file  Stress: Not on file  Social Connections: Not on file  Intimate Partner Violence: Not on file     enoxaparin (LOVENOX) injection  40 mg Subcutaneous Q24H   furosemide  40 mg Oral Daily   guaiFENesin  600 mg Oral BID   insulin aspart  0-15 Units Subcutaneous TID WC   [START ON 07/25/2021] irbesartan  300 mg Oral Daily   mometasone-formoterol  2 puff Inhalation BID   potassium chloride  40 mEq Oral Q4H   rosuvastatin  5 mg Oral Daily    No Known Allergies  Review of Systems - Negative except weakness due to being bed bound related to this most recent hospitalization  Exam Vitals:   07/24/21 0730 07/24/21 1300  BP: (!) 157/90 (!) 153/57  Pulse:    Resp:    Temp: 98 F (36.7 C) 98.2 F (36.8 C)  SpO2:      Physical Examination: General appearance - alert, well appearing, and in no distress and overweight Neck - supple, no significant adenopathy Chest - normal effort Heart - S1 and S2 normal Abdomen - soft, nontender, nondistended, no masses or organomegaly Neurological - alert, oriented, normal speech, no focal findings or movement disorder noted Extremities - no pedal edema noted Skin - normal coloration and turgor, no rashes, no suspicious skin lesions noted  Labs:  Results for orders placed or performed  during the hospital encounter of 07/17/21 (from the past 24 hour(s))  Glucose, capillary     Status: Abnormal   Collection Time: 07/23/21  5:58 PM  Result Value Ref Range   Glucose-Capillary 132 (H) 70 - 99 mg/dL   Comment 1 Notify RN    Comment 2 Document in Chart   Glucose, capillary     Status: Abnormal   Collection Time: 07/23/21  9:15 PM  Result Value Ref Range   Glucose-Capillary 121 (H) 70 - 99 mg/dL  CBC     Status: Abnormal   Collection Time: 07/24/21  4:21 AM  Result Value Ref Range   WBC 11.1 (H) 4.0 - 10.5 K/uL   RBC 4.22 3.87 - 5.11 MIL/uL   Hemoglobin 12.8 12.0 - 15.0  g/dL   HCT 38.4 36.0 - 46.0 %   MCV 91.0 80.0 - 100.0 fL   MCH 30.3 26.0 - 34.0 pg   MCHC 33.3 30.0 - 36.0 g/dL   RDW 13.1 11.5 - 15.5 %   Platelets 440 (H) 150 - 400 K/uL   nRBC 0.0 0.0 - 0.2 %  Basic metabolic panel     Status: Abnormal   Collection Time: 07/24/21  4:21 AM  Result Value Ref Range   Sodium 137 135 - 145 mmol/L   Potassium 3.1 (L) 3.5 - 5.1 mmol/L   Chloride 104 98 - 111 mmol/L   CO2 26 22 - 32 mmol/L   Glucose, Bld 127 (H) 70 - 99 mg/dL   BUN 7 (L) 8 - 23 mg/dL   Creatinine, Ser 0.86 0.44 - 1.00 mg/dL   Calcium 9.0 8.9 - 10.3 mg/dL   GFR, Estimated >60 >60 mL/min   Anion gap 7 5 - 15  Glucose, capillary     Status: Abnormal   Collection Time: 07/24/21  7:22 AM  Result Value Ref Range   Glucose-Capillary 126 (H) 70 - 99 mg/dL  Glucose, capillary     Status: Abnormal   Collection Time: 07/24/21 11:40 AM  Result Value Ref Range   Glucose-Capillary 167 (H) 70 - 99 mg/dL  Glucose, capillary     Status: Abnormal   Collection Time: 07/24/21  3:05 PM  Result Value Ref Range   Glucose-Capillary 155 (H) 70 - 99 mg/dL   Comment 1 Notify RN    Comment 2 Document in Chart      Radiological Studies CT CHEST W CONTRAST  Result Date: 07/21/2021 CLINICAL DATA:  Chest pain and shortness of breath. EXAM: CT CHEST WITH CONTRAST TECHNIQUE: Multidetector CT imaging of the chest was performed during intravenous contrast administration. CONTRAST:  61mL OMNIPAQUE IOHEXOL 350 MG/ML SOLN COMPARISON:  Chest radiograph 07/20/2021 FINDINGS: Cardiovascular: No significant vascular findings. Normal heart size. No pericardial effusion. Mediastinum/Nodes: No enlarged mediastinal, hilar, or axillary lymph nodes. Thyroid gland, trachea, and esophagus demonstrate no significant findings. Lungs/Pleura: Peribronchovascular consolidation with adjacent mild ground-glass opacities involves the entire lateral segment of the right middle lobe. Moderate right and trace left pleural effusions with  adjacent compressive atelectasis in the lower lobes. Upper Abdomen: No acute abnormality. Musculoskeletal: No chest wall abnormality. No acute or significant osseous findings. IMPRESSION: 1. Bronchopneumonia involving the entire lateral segment of the right middle lobe. 2. Moderate right and trace left pleural effusions with adjacent compressive atelectasis in the lower lobes. Electronically Signed   By: Ileana Roup M.D.   On: 07/21/2021 15:58   NM Hepatobiliary Liver Func  Result Date: 07/18/2021 CLINICAL DATA:  Abdominal pain. EXAM: NUCLEAR  MEDICINE HEPATOBILIARY IMAGING TECHNIQUE: Sequential images of the abdomen were obtained out to 60 minutes following intravenous administration of radiopharmaceutical. RADIOPHARMACEUTICALS:  5.3 mCi Tc-60m  Choletec IV COMPARISON:  CT abdomen pelvis, 07/17/2021 FINDINGS: Prompt uptake and biliary excretion of activity by the liver is seen. Gallbladder activity is visualized, consistent with patency of cystic duct. Biliary activity passes into small bowel, consistent with patent common bile duct. IMPRESSION: 1.  The cystic and common bile ducts are patent. 2. Gallbladder ejection fraction was not assessed on this examination. Please note that gallbladder ejection fraction measurement is not reliable or validated in the acute inpatient setting and assessment for gallbladder dysmotility should generally be deferred for outpatient evaluation. Electronically Signed   By: Delanna Ahmadi M.D.   On: 07/18/2021 09:14   US PELVIS LIMITED (TRANSABDOMINAL ONLY)  Result Date: 07/17/2021 CLINICAL DATA:  Abnormal CT. EXAM: TRANSABDOMINAL ULTRASOUND OF PELVIS TECHNIQUE: Transabdominal ultrasound examination of the pelvis was performed including evaluation of the uterus, ovaries, adnexal regions, and pelvic cul-de-sac. COMPARISON:  Abdominal ultrasound and CT abdomen and pelvis 07/17/2021. FINDINGS: Uterus Measurements: 8.1 x 4.0 x 4.9 cm = volume: 82 mL. 1.8 x 0.7 x 1.9 cm calcified  exophytic fibroid. Myometrium otherwise within normal limits. Endometrium Thickness: 9 mm.  Difficult to visualize.  Appears echogenic. Right ovary Not visualized. Left ovary There is a 5.7 x 6.4 x 7.2 cm mass in the left adnexa. This is slightly hypoechoic. Separate normal appearing left ovary is not visualized. Other findings:  No abnormal free fluid. IMPRESSION: 1. Technically limited study. 2. Indeterminate left adnexal mass measuring 7.2 cm may be related to ovarian malignancy or other ovarian tumor. 3. Endometrium appears thickened and echogenic. Differential diagnosis includes endometrial hyperplasia, polyp or carcinoma. 4. Right ovary not visualized. 5. Small calcified uterine fibroid. Electronically Signed   By: Ronney Asters M.D.   On: 07/17/2021 17:23   CT ABDOMEN PELVIS W CONTRAST  Result Date: 07/17/2021 CLINICAL DATA:  Constant severe right upper quadrant abdominal pain since yesterday. Fever. Intermittent vomiting. EXAM: CT ABDOMEN AND PELVIS WITH CONTRAST TECHNIQUE: Multidetector CT imaging of the abdomen and pelvis was performed using the standard protocol following bolus administration of intravenous contrast. CONTRAST:  68mL OMNIPAQUE IOHEXOL 300 MG/ML  SOLN COMPARISON:  None. FINDINGS: Lower chest: Dense consolidation in the posterior right middle lobe with small air bronchograms. Mild patchy and linear atelectasis in the right lower lobe. Small right pleural effusion. Mild atelectasis at the left lung base. Hepatobiliary: Borderline dilated gallbladder, measuring 10.3 cm in length. No visible gallstones. No gallbladder wall thickening or pericholecystic fluid. Two small cysts in the left lobe of the liver. Pancreas: Unremarkable. No pancreatic ductal dilatation or surrounding inflammatory changes. Spleen: Normal in size without focal abnormality. Adrenals/Urinary Tract: 2.1 cm homogeneously fat density mass in the lateral right kidney. Unremarkable left kidney, ureters, urinary bladder and  adrenal glands. Stomach/Bowel: Multiple sigmoid and distal descending colon diverticula without evidence of diverticulitis. Fluid in the right colon without wall thickening. Unremarkable stomach, small bowel and appendix. Vascular/Lymphatic: Atheromatous arterial calcifications without aneurysm. No enlarged lymph nodes. Reproductive: Normal sized uterus containing a 1.4 cm anterior exophytic densely calcified fibroid. Heterogeneous low density in the endometrial cavity measuring 3.4 x 2.3 cm on image number 74/3. Cystic mass posterior to the uterus on the right which without supper identification of the right ovary. This measures 7.0 x 4.2 cm on image number 75/3. Heterogeneous solid mass posterior to the uterus on the left without separate identification of  the left ovary. This mass measures 9.1 x 7.8 cm on image number 74/3. Other: Small umbilical hernia containing fat. No free peritoneal air seen. Minimal free fluid in the left posterior para colonic gutter. Musculoskeletal: Lumbar and lower thoracic spine degenerative changes with associated grade 1 anterolisthesis at the L4-5 level. No pars defects are seen. Mild scoliosis. IMPRESSION: 1. Dense right middle lobe atelectasis or pneumonia. 2. Small right pleural effusion. 3. Mild bibasilar atelectasis. 4. 7.0 cm cystic right adnexal mass and 9.1 cm solid left adnexal mass. These could be further evaluated with elective pelvic ultrasound or MRI of the pelvis. 5. Thickened, heterogeneous endometrium, suspicious for endometrial carcinoma. 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 7. Sigmoid and distal descending colon diverticulosis. Electronically Signed   By: Claudie Revering M.D.   On: 07/17/2021 13:57   US Abdomen Limited  Result Date: 07/17/2021 CLINICAL DATA:  RIGHT upper quadrant pain. EXAM: ULTRASOUND ABDOMEN LIMITED RIGHT UPPER QUADRANT COMPARISON:  CT abdomen from earlier same day. FINDINGS: Gallbladder: Gallbladder is distended. Portions of the gallbladder  are suboptimally visualized. Questionable gallbladder wall thickening at the fundus. No pericholecystic fluid. No sonographic Murphy's sign elicited during the exam, per the sonographer. Common bile duct: Diameter: 4 mm Liver: No focal lesion identified. Generalized hyperechogenicity suggesting some degree of fatty infiltration. Portal vein is patent on color Doppler imaging with normal direction of blood flow towards the liver. Other: None. IMPRESSION: 1. No convincing evidence of acute cholecystitis. No gallstones seen. Questionable gallbladder wall thickening at the fundus. If localizable RIGHT upper quadrant pain persists or worsens, consider nuclear medicine HIDA scan for further characterization. 2. No bile duct dilatation. 3. Probable fatty infiltration of the liver. Electronically Signed   By: Franki Cabot M.D.   On: 07/17/2021 14:32   DG Chest Port 1 View  Result Date: 07/24/2021 CLINICAL DATA:  Follow-up pneumonia. EXAM: PORTABLE CHEST 1 VIEW COMPARISON:  Chest CT 07/21/2021, portable chest 07/20/2021 FINDINGS: Dense right middle lobe consolidation, on CT seen throughout the lateral segment of the right middle lobe, is again noted and associated with layering right pleural effusion, with minimal left pleural effusion again also seen. There is stable atelectasis at the left base. Remainder of the lungs are clear. The cardiac size is normal. Calcifications are again noted in the aortic arch. IMPRESSION: Right middle lobe pneumonia and parapneumonic effusion with stable overall aeration. Electronically Signed   By: Telford Nab M.D.   On: 07/24/2021 06:01   DG CHEST PORT 1 VIEW  Result Date: 07/17/2021 CLINICAL DATA:  Right upper quadrant pain EXAM: PORTABLE CHEST 1 VIEW COMPARISON:  02/06/2005, CT 07/17/2021 FINDINGS: Consolidation in the right lung base. No pleural effusion. Cardiomediastinal silhouette within normal limits. Aortic atherosclerosis. No pneumothorax IMPRESSION: Consolidation in  the right lung base is suspicious for pneumonia. Radiographic follow-up to resolution is recommended Electronically Signed   By: Donavan Foil M.D.   On: 07/17/2021 23:06   DG Chest Port 1V same Day  Result Date: 07/20/2021 CLINICAL DATA:  Shortness of breath and wheezing, chest pain EXAM: PORTABLE CHEST 1 VIEW COMPARISON:  Chest radiograph dated July 17, 2021 FINDINGS: The heart is mildly enlarged. Atherosclerotic calcification of aortic arch, unchanged. Redemonstration of right lower lobe opacity, which have slightly increased from prior examination concerning for atelectasis/pneumonia. Small right pleural effusion. No appreciable pneumothorax. IMPRESSION: 1. The right lower lobe opacity concerning for pneumonia/atelectasis with small pleural effusion, increased in size since prior examination. Follow-up examination to resolution is recommended. 2.  Stable  cardiomegaly. Electronically Signed   By: Keane Police D.O.   On: 07/20/2021 10:46   DG Foot 2 Views Left  Result Date: 07/23/2021 CLINICAL DATA:  Sepsis, right foot pain EXAM: LEFT FOOT - 2 VIEW COMPARISON:  05/01/2017 FINDINGS: Normal alignment. No acute fracture or dislocation. Joint spaces are preserved. No focal erosions. Small plantar calcaneal spur. Mild diffuse soft tissue swelling of the left foot is noted, more severe involving the forefoot IMPRESSION: Soft tissue swelling.  No fracture or dislocation. Electronically Signed   By: Fidela Salisbury M.D.   On: 07/23/2021 20:34   MR ABDOMEN MRCP W WO CONTAST  Result Date: 07/20/2021 CLINICAL DATA:  Right upper quadrant abdominal pain EXAM: MRI ABDOMEN WITHOUT AND WITH CONTRAST (INCLUDING MRCP) TECHNIQUE: Multiplanar multisequence MR imaging of the abdomen was performed both before and after the administration of intravenous contrast. Heavily T2-weighted images of the biliary and pancreatic ducts were obtained, and three-dimensional MRCP images were rendered by post processing. CONTRAST:  7mL  GADAVIST GADOBUTROL 1 MMOL/ML IV SOLN COMPARISON:  07/17/2021 FINDINGS: Exam detail is diminished due to motion artifact. This particularly affects the postcontrast images. Lower chest: No acute findings. Hepatobiliary: Diffuse hepatic steatosis. 1.1 cm, well-circumscribed T2 hyperintense structure within segment 2 of the left hepatic lobe is identified compatible with a simple cyst, image 28/6. The gallbladder appears moderately distended. No wall thickening. Mild pericholecystic fluid. No gallstones identified. The common bile duct has a normal caliber. No signs of choledocholithiasis. Pancreas: There is mild pancreatic edema with peripancreatic fluid surrounding the head and neck of pancreas concerning for acute pancreatitis. No main duct dilatation or mass identified. Spleen:  Within normal limits in size and appearance. Adrenals/Urinary Tract: Normal adrenal glands. 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 No hydronephrosis identified bilaterally. Stomach/Bowel: Visualized portions within the abdomen are unremarkable. Vascular/Lymphatic: Aortic atherosclerosis without aneurysm. Upper abdominal vascularity appears patent. No abdominal adenopathy. Other: Small volume of peripancreatic ascites identified. No focal fluid collections identified. Musculoskeletal: No suspicious bone lesions identified. IMPRESSION: 1. Mild pancreatic edema with peripancreatic fluid surrounding the head and neck of pancreas concerning for acute pancreatitis. No main duct dilatation or mass identified. 2. No gallstones or evidence of choledocholithiasis. 3. Mild hepatic steatosis. 4. Right kidney angiomyolipoma. Electronically Signed   By: Kerby Moors M.D.   On: 07/20/2021 06:30    Thank you so much for allowing [place] to participate in the care of this patient.  We will continue to follow with you. Please call the attending OB/GYN physician with  questions or concerns at [phone] M-F 8a-5p, after hours and on weekends, we can be reached at [phone].

## 2021-07-24 NOTE — Progress Notes (Signed)
PROGRESS NOTE    Jade Sanchez  MKL:[id number] DOB: 02-Mar-1941 DOA: 07/17/2021 PCP: Lavone Orn, MD   Brief Narrative:  80 years old female with past medical history of diabetes mellitus type 2 hypertension dyslipidemia chronic kidney disease stage III primary hyperparathyroidism came with a chief complaint of right upper quadrant pain vomiting and fever for the last 2 days Patient has sepsis criteria and was started empirically on Zosyn and vancomycin. He had a work-up for right upper quadrant abdominal pain with an ultrasound and HIDA scan which was negative for cholecystitis CT abdomen and pelvis showed cystic adnexal masses bilaterally and heterogeneous endometrial thickening.  Assessment & Plan:   Principal Problem:   Sepsis ([place]) Active Problems:   Chronic kidney disease, stage 3a ([place])   Essential hypertension   Hypercholesterolemia   Type 2 diabetes mellitus (HCC)   Pelvic mass   Elevated bilirubin   RUQ pain   Hyponatremia  Sepsis secondary to community-acquired pneumonia, POA: Patient met sepsis criteria based on fever, tachycardia and tachypnea.  Sepsis parameters resolved.  All the cultures have remained negative so far.  Patient has remained on Zosyn and vancomycin however MRSA PCR is negative so I will discontinue vancomycin as there is no evidence of MRSA infection however I will continue Zosyn.  She is on room air.  Right upper quadrant abdominal pain: Resolved, HIDA scan negative for acute cholecystitis and ultrasound gallbladder showed wall thickening but no acute cholecystitis or gallstones.  MRI abdomen showed mild pancreatitis which could be the cause of her abdominal pain however that has resolved as well.  She was seen by general surgery and no hepatobiliary etiology was found as well.  Left adnexal mass/thickened endometrial lining: Ultrasound pelvis and MRI abdomen confirmed mass highly suspicious for malignancy.  I have now consulted GYN and  discussed with Dr. Kennon Rounds who is going to see this patient.  Type 2 diabetes mellitus: Blood sugar controlled on SSI.  CKD stage IIIa: Stable.  Essential hypertension: Stable.  Continue current regimen.  Hypokalemia: 3.1, will replace.  DVT prophylaxis: enoxaparin (LOVENOX) injection 40 mg Start: 07/17/21 2200   Code Status: Full Code  Family Communication:  None present at bedside.  Plan of care discussed with patient in length and he verbalized understanding and agreed with it.  Status is: Inpatient  Remains inpatient appropriate because: Needs to be evaluated by gynecology and [facility] working on SNF placement.  Estimated body mass index is 38.37 kg/m as calculated from the following:   Height as of this encounter: 5' 7"  (1.702 m).   Weight as of this encounter: 111.1 kg.     Nutritional Assessment: Body mass index is 38.37 kg/m.Marland Kitchen Seen by dietician.  I agree with the assessment and plan as outlined below: Nutrition Status:   Skin Assessment: I have examined the patient's skin and I agree with the wound assessment as performed by the wound care RN as outlined below:    Consultants:  Gynecology  Procedures:  None  Antimicrobials:  Anti-infectives (From admission, onward)    Start     Dose/Rate Route Frequency Ordered Stop   07/22/21 1600  vancomycin (VANCOREADY) IVPB 1250 mg/250 mL  Status:  Discontinued        1,250 mg 166.7 mL/hr over 90 Minutes Intravenous Every 24 hours 07/22/21 0856 07/24/21 1016   07/18/21 1600  vancomycin (VANCOREADY) IVPB 1000 mg/200 mL  Status:  Discontinued        1,000 mg 200 mL/hr over 60 Minutes Intravenous  Every 24 hours 07/17/21 1444 07/22/21 0856   07/17/21 2100  piperacillin-tazobactam (ZOSYN) IVPB 3.375 g        3.375 g 12.5 mL/hr over 240 Minutes Intravenous Every 8 hours 07/17/21 1755     07/17/21 1445  vancomycin (VANCOREADY) IVPB 1750 mg/350 mL        1,750 mg 175 mL/hr over 120 Minutes Intravenous  Once 07/17/21 1444 07/17/21  1732   07/17/21 1200  piperacillin-tazobactam (ZOSYN) IVPB 3.375 g        3.375 g 100 mL/hr over 30 Minutes Intravenous  Once 07/17/21 1159 07/17/21 1312          Subjective: Seen and examined.  She has no complaints.  She did not even bring up bring up the toe pain.  No abdominal pain.  Objective: Vitals:   07/23/21 2012 07/23/21 2313 07/24/21 0313 07/24/21 0730  BP: (!) 153/74 (!) 163/77  (!) 157/90  Pulse: 66 66    Resp: 20 (!) 21 20   Temp: 98.2 F (36.8 C)  98.6 F (37 C) 98 F (36.7 C)  TempSrc: Oral  Oral Oral  SpO2: 94% 92%    Weight:      Height:        Intake/Output Summary (Last 24 hours) at 07/24/2021 1133 Last data filed at 07/24/2021 0700 Gross per 24 hour  Intake 360 ml  Output 2676 ml  Net -2316 ml   Filed Weights   07/17/21 2100 07/21/21 0921  Weight: 114.3 kg 111.1 kg    Examination:  General exam: Appears calm and comfortable, obese Respiratory system: Clear to auscultation. Respiratory effort normal. Cardiovascular system: S1 & S2 heard, RRR. No JVD, murmurs, rubs, gallops or clicks. No pedal edema. Gastrointestinal system: Abdomen is nondistended, soft and nontender. No organomegaly or masses felt. Normal bowel sounds heard. Central nervous system: Alert and oriented. No focal neurological deficits. Extremities: Symmetric 5 x 5 power. Skin: No rashes, lesions or ulcers Psychiatry: Judgement and insight appear normal. Mood & affect appropriate.    Data Reviewed: I have personally reviewed following labs and imaging studies  CBC: Recent Labs  Lab 07/19/21 0217 07/20/21 0137 07/21/21 0525 07/23/21 1317 07/24/21 0421  WBC 14.8* 10.7* 9.9 12.5* 11.1*  HGB 11.0* 10.9* 11.6* 13.2 12.8  HCT 33.3* 33.5* 34.5* 39.3 38.4  MCV 95.4 93.8 93.2 91.2 91.0  PLT 244 269 283 484* 188*   Basic Metabolic Panel: Recent Labs  Lab 07/19/21 0217 07/20/21 0137 07/21/21 0525 07/23/21 1317 07/24/21 0421  NA 132* 134* 137 138 137  K 3.9 4.8 4.3 3.6  3.1*  CL 103 103 104 103 104  CO2 24 25 28 28 26   GLUCOSE 131* 127* 156* 163* 127*  BUN 20 17 10 8  7*  CREATININE 1.28* 0.97 1.01* 0.85 0.86  CALCIUM 8.4* 8.6* 9.0 9.8 9.0   GFR: Estimated Creatinine Clearance: 69.3 mL/min (by C-G formula based on SCr of 0.86 mg/dL). Liver Function Tests: Recent Labs  Lab 07/18/21 0300 07/19/21 0217 07/20/21 0137  AST 21 29 34  ALT 19 28 25   ALKPHOS 48 52 50  BILITOT 1.2 1.0 1.8*  PROT 5.8* 5.7* 5.5*  ALBUMIN 2.7* 2.4* 2.3*   Recent Labs  Lab 07/20/21 0137  LIPASE 46   No results for input(s): AMMONIA in the last 168 hours. Coagulation Profile: No results for input(s): INR, PROTIME in the last 168 hours. Cardiac Enzymes: No results for input(s): CKTOTAL, CKMB, CKMBINDEX, TROPONINI in the last 168 hours. BNP (last  3 results) No results for input(s): PROBNP in the last 8760 hours. HbA1C: No results for input(s): HGBA1C in the last 72 hours. CBG: Recent Labs  Lab 07/23/21 0817 07/23/21 1136 07/23/21 1758 07/23/21 2115 07/24/21 0722  GLUCAP 139* 73 132* 121* 126*   Lipid Profile: No results for input(s): CHOL, HDL, LDLCALC, TRIG, CHOLHDL, LDLDIRECT in the last 72 hours. Thyroid Function Tests: No results for input(s): TSH, T4TOTAL, FREET4, T3FREE, THYROIDAB in the last 72 hours. Anemia Panel: No results for input(s): VITAMINB12, FOLATE, FERRITIN, TIBC, IRON, RETICCTPCT in the last 72 hours. Sepsis Labs: Recent Labs  Lab 07/17/21 1208 07/17/21 1308 07/17/21 2044 07/18/21 0300 07/19/21 0217  PROCALCITON  --   --  0.73 0.79 0.77  LATICACIDVEN 1.4 1.5  --   --   --     Recent Results (from the past 240 hour(s))  Blood culture (routine x 2)     Status: None   Collection Time: 07/17/21 12:30 PM   Specimen: BLOOD RIGHT ARM  Result Value Ref Range Status   Specimen Description BLOOD RIGHT ARM  Final   Special Requests   Final    BOTTLES DRAWN AEROBIC AND ANAEROBIC Blood Culture adequate volume   Culture   Final    NO  GROWTH 5 DAYS Performed at [facility], [place] [address], [place], [place] [address]    Report Status 07/22/2021 FINAL  Final  Blood culture (routine x 2)     Status: None   Collection Time: 07/17/21 12:41 PM   Specimen: BLOOD  Result Value Ref Range Status   Specimen Description BLOOD SITE NOT SPECIFIED  Final   Special Requests   Final    BOTTLES DRAWN AEROBIC AND ANAEROBIC Blood Culture adequate volume   Culture   Final    NO GROWTH 5 DAYS Performed at [place] [address], [place], [place] [address]    Report Status 07/22/2021 FINAL  Final  Resp Panel by RT-PCR (Flu A&B, Covid) Nasopharyngeal Swab     Status: None   Collection Time: 07/17/21  3:35 PM   Specimen: Nasopharyngeal Swab; Nasopharyngeal(NP) swabs in vial transport medium  Result Value Ref Range Status   SARS Coronavirus 2 by RT PCR NEGATIVE NEGATIVE Final    Comment: (NOTE) SARS-CoV-2 target nucleic acids are NOT DETECTED.  The SARS-CoV-2 RNA is generally detectable in upper respiratory specimens during the acute phase of infection. The lowest concentration of SARS-CoV-2 viral copies this assay can detect is 138 copies/mL. A negative result does not preclude SARS-Cov-2 infection and should not be used as the sole basis for treatment or other patient management decisions. A negative result may occur with  improper specimen collection/handling, submission of specimen other than nasopharyngeal swab, presence of viral mutation(s) within the areas targeted by this assay, and inadequate number of viral copies(<138 copies/mL). A negative result must be combined with clinical observations, patient history, and epidemiological information. The expected result is Negative.  Fact Sheet for Patients:  EntrepreneurPulse.com.au  Fact Sheet for Healthcare Providers:  IncredibleEmployment.be  This test is no t yet approved or cleared by the [place] FDA and  has  been authorized for detection and/or diagnosis of SARS-CoV-2 by FDA under an Emergency Use Authorization (EUA). This EUA will remain  in effect (meaning this test can be used) for the duration of the COVID-19 declaration under Section 564(b)(1) of the Act, 21 U.S.C.section 360bbb-3(b)(1), unless the authorization is terminated  or revoked sooner.  Influenza A by PCR NEGATIVE NEGATIVE Final   Influenza B by PCR NEGATIVE NEGATIVE Final    Comment: (NOTE) The Xpert Xpress SARS-CoV-2/FLU/RSV plus assay is intended as an aid in the diagnosis of influenza from Nasopharyngeal swab specimens and should not be used as a sole basis for treatment. Nasal washings and aspirates are unacceptable for Xpert Xpress SARS-CoV-2/FLU/RSV testing.  Fact Sheet for Patients: EntrepreneurPulse.com.au  Fact Sheet for Healthcare Providers: IncredibleEmployment.be  This test is not yet approved or cleared by the [place] FDA and has been authorized for detection and/or diagnosis of SARS-CoV-2 by FDA under an Emergency Use Authorization (EUA). This EUA will remain in effect (meaning this test can be used) for the duration of the COVID-19 declaration under Section 564(b)(1) of the Act, 21 U.S.C. section 360bbb-3(b)(1), unless the authorization is terminated or revoked.  Performed at [facility], [place] [address], [place], [place] [address]   Respiratory (~20 pathogens) panel by PCR     Status: None   Collection Time: 07/17/21  9:52 PM   Specimen: Nasopharyngeal Swab; Respiratory  Result Value Ref Range Status   Adenovirus NOT DETECTED NOT DETECTED Final   Coronavirus 229E NOT DETECTED NOT DETECTED Final    Comment: (NOTE) The Coronavirus on the Respiratory Panel, DOES NOT test for the novel  Coronavirus (2019 nCoV)    Coronavirus HKU1 NOT DETECTED NOT DETECTED Final   Coronavirus NL63 NOT DETECTED NOT DETECTED Final   Coronavirus OC43 NOT DETECTED  NOT DETECTED Final   Metapneumovirus NOT DETECTED NOT DETECTED Final   Rhinovirus / Enterovirus NOT DETECTED NOT DETECTED Final   Influenza A NOT DETECTED NOT DETECTED Final   Influenza B NOT DETECTED NOT DETECTED Final   Parainfluenza Virus 1 NOT DETECTED NOT DETECTED Final   Parainfluenza Virus 2 NOT DETECTED NOT DETECTED Final   Parainfluenza Virus 3 NOT DETECTED NOT DETECTED Final   Parainfluenza Virus 4 NOT DETECTED NOT DETECTED Final   Respiratory Syncytial Virus NOT DETECTED NOT DETECTED Final   Bordetella pertussis NOT DETECTED NOT DETECTED Final   Bordetella Parapertussis NOT DETECTED NOT DETECTED Final   Chlamydophila pneumoniae NOT DETECTED NOT DETECTED Final   Mycoplasma pneumoniae NOT DETECTED NOT DETECTED Final    Comment: Performed at [facility], [place] [address], [place], [place] [address]  MRSA Next Gen by PCR, Nasal     Status: None   Collection Time: 07/17/21 11:05 PM   Specimen: Nasal Mucosa; Nasal Swab  Result Value Ref Range Status   MRSA by PCR Next Gen NOT DETECTED NOT DETECTED Final    Comment: (NOTE) The GeneXpert MRSA Assay (FDA approved for NASAL specimens only), is one component of a comprehensive MRSA colonization surveillance program. It is not intended to diagnose MRSA infection nor to guide or monitor treatment for MRSA infections. Test performance is not FDA approved in patients less than 36 years old. Performed at [facility], [place] [address], [place], [place] [address]       Radiology Studies: DG Chest Port 1 View  Result Date: 07/24/2021 CLINICAL DATA:  Follow-up pneumonia. EXAM: PORTABLE CHEST 1 VIEW COMPARISON:  Chest CT 07/21/2021, portable chest 07/20/2021 FINDINGS: Dense right middle lobe consolidation, on CT seen throughout the lateral segment of the right middle lobe, is again noted and associated with layering right pleural effusion, with minimal left pleural effusion again also seen. There is stable atelectasis at the left  base. Remainder of the lungs are clear. The cardiac size  is normal. Calcifications are again noted in the aortic arch. IMPRESSION: Right middle lobe pneumonia and parapneumonic effusion with stable overall aeration. Electronically Signed   By: Telford Nab M.D.   On: 07/24/2021 06:01   DG Foot 2 Views Left  Result Date: 07/23/2021 CLINICAL DATA:  Sepsis, right foot pain EXAM: LEFT FOOT - 2 VIEW COMPARISON:  05/01/2017 FINDINGS: Normal alignment. No acute fracture or dislocation. Joint spaces are preserved. No focal erosions. Small plantar calcaneal spur. Mild diffuse soft tissue swelling of the left foot is noted, more severe involving the forefoot IMPRESSION: Soft tissue swelling.  No fracture or dislocation. Electronically Signed   By: Fidela Salisbury M.D.   On: 07/23/2021 20:34    Scheduled Meds:  enoxaparin (LOVENOX) injection  40 mg Subcutaneous Q24H   furosemide  40 mg Oral Daily   guaiFENesin  600 mg Oral BID   insulin aspart  0-15 Units Subcutaneous TID WC   irbesartan  75 mg Oral Daily   mometasone-formoterol  2 puff Inhalation BID   potassium chloride  40 mEq Oral Q4H   rosuvastatin  5 mg Oral Daily   Continuous Infusions:  piperacillin-tazobactam (ZOSYN)  IV 3.375 g (07/24/21 0507)     LOS: 7 days   Time spent: 33 minutes   Darliss Cheney, MD [facility]  07/24/2021, 11:33 AM  Please page via [facility] and do not message via secure chat for anything urgent. Secure chat can be used for anything non urgent.  How to contact the [facility] Attending or Consulting provider [place] or covering provider during after hours [place], for this patient?  Check the care team in [facility] and look for a) attending/consulting TRH provider listed and b) the [facility] team listed. Page or secure chat [phone]. Log into [email or web address] and use [place]'s universal password to access. If you do not have the password, please contact the hospital operator. Locate the [facility] provider you are looking for under [facility] and page to a number that you can be directly reached. If you still have difficulty reaching the provider, please page the [facility] (Director on Call) for the Hospitalists listed on amion for assistance.

## 2021-07-24 NOTE — Progress Notes (Signed)
Occupational Therapy Treatment Patient Details Name: Anjani Feuerborn [facility] MRN: [id number] DOB: [birth year] Today's Date: 07/24/2021   History of present illness 80 y/o female presented to ED on 11/28 for RUQ pain and fever. CT abdomen/pelvis showed R middle lobe atelectasis vs pneumonia with small R pleural effusion. Bilateral adnexal cystic masses, thickened endometrial lining  CT finding concerning for malignancy. Found to be in sepsis in setting of possible pneumonia vs atelectasis in R middle lobe. PMH: HTN, DM   OT comments  Pt making steady progress. Able to ambulate to toilet, complete ADL with min A, then ambulate @ 50 ft @ RW level with improved activity tolerance. If pt continues to improve and has 24/7 assistance after DC, should be able to DC home with [facility] services. Encourage ambulation with nsg staff @ RW level. Will continue to follow acutely.    Recommendations for follow up therapy are one component of a multi-disciplinary discharge planning process, led by the attending physician.  Recommendations may be updated based on patient status, additional functional criteria and insurance authorization.    Follow Up Recommendations  Skilled nursing-short term rehab (<3 hours/day) (May progress to home with [facility] if 24/7 assistance available)    Assistance Recommended at Discharge Frequent or constant Supervision/Assistance  Equipment Recommendations  BSC/3in1    Recommendations for Other Services      Precautions / Restrictions Precautions Precautions: Fall Precaution Comments: L foot pain       Mobility Bed Mobility               General bed mobility comments: OOB in chair    Transfers Overall transfer level: Needs assistance Equipment used: Rolling walker (2 wheels) Transfers: Sit to/from Stand Sit to Stand: Min guard                 Balance Overall balance assessment: Needs assistance   Sitting balance-Leahy Scale: Good       Standing  balance-Leahy Scale: Poor                             ADL either performed or assessed with clinical judgement   ADL Overall ADL's : Needs assistance/impaired     Grooming: Set up   Upper Body Bathing: Sitting   Lower Body Bathing: Minimal assistance       Lower Body Dressing: Minimal assistance;Sit to/from stand   Toilet Transfer: Min guard;Ambulation;Regular Toilet;Grab bars;Rolling walker (2 wheels)   Toileting- Clothing Manipulation and Hygiene: Min guard       Functional mobility during ADLs: Min guard;Rolling walker (2 wheels) General ADL Comments: iimproved activity tolerance this date    Extremity/Trunk Assessment Upper Extremity Assessment Upper Extremity Assessment: Generalized weakness   Lower Extremity Assessment Lower Extremity Assessment: Defer to PT evaluation        Vision       Perception     Praxis      Cognition Arousal/Alertness: Awake/alert Behavior During Therapy: WFL for tasks assessed/performed Overall Cognitive Status: Within Functional Limits for tasks assessed                                            Exercises Other Exercises Other Exercises: encouraged genral UE exercises; issued evel 1 theraband   Shoulder Instructions       General Comments      Pertinent  Vitals/ Pain       Pain Assessment: 0-10 Pain Score: 2  Pain Location: L foot Pain Descriptors / Indicators: Discomfort Pain Intervention(s): Limited activity within patient's tolerance  Home Living                                          Prior Functioning/Environment              Frequency  Min 2X/week        Progress Toward Goals  OT Goals(current goals can now be found in the care plan section)  Progress towards OT goals: Progressing toward goals  Acute Rehab OT Goals Patient Stated Goal: to get her strength back OT Goal Formulation: With patient Time For Goal Achievement: 08/06/21 Potential  to Achieve Goals: Good ADL Goals Pt Will Perform Lower Body Bathing: with modified independence;sit to/from stand Pt Will Perform Lower Body Dressing: with modified independence;sit to/from stand Pt Will Transfer to Toilet: with modified independence;ambulating Pt Will Perform Toileting - Clothing Manipulation and hygiene: with modified independence Additional ADL Goal #1: PT will verbalize 3 strategies to reduce risk of falls  Plan Discharge plan remains appropriate    Co-evaluation                 AM-PAC OT "6 Clicks" Daily Activity     Outcome Measure   Help from another person eating meals?: None Help from another person taking care of personal grooming?: A Little Help from another person toileting, which includes using toliet, bedpan, or urinal?: A Little Help from another person bathing (including washing, rinsing, drying)?: A Little Help from another person to put on and taking off regular upper body clothing?: A Little Help from another person to put on and taking off regular lower body clothing?: A Little 6 Click Score: 19    End of Session Equipment Utilized During Treatment: Gait belt;Rolling walker (2 wheels)  OT Visit Diagnosis: Unsteadiness on feet (R26.81);Muscle weakness (generalized) (M62.81);Pain Pain - Right/Left: Left Pain - part of body: Ankle and joints of foot   Activity Tolerance Patient tolerated treatment well   Patient Left in chair;with call bell/phone within reach   Nurse Communication Mobility status;Other (comment) (encourage ambulation with staff)        Time: 3570-1779 OT Time Calculation (min): 31 min  Charges: OT General Charges $OT Visit: 1 Visit OT Treatments $Self Care/Home Management : 23-37 mins  Maurie Boettcher, OT/L   Acute OT Clinical Specialist [place] Pager [phone] Office [phone]   [facility] 07/24/2021, 3:43 PM

## 2021-07-25 DIAGNOSIS — A419 Sepsis, unspecified organism: Secondary | ICD-10-CM | POA: Diagnosis not present

## 2021-07-25 LAB — CBC WITH DIFFERENTIAL/PLATELET
Abs Immature Granulocytes: 0 10*3/uL (ref 0.00–0.07)
Basophils Absolute: 0 10*3/uL (ref 0.0–0.1)
Basophils Relative: 0 %
Eosinophils Absolute: 0.3 10*3/uL (ref 0.0–0.5)
Eosinophils Relative: 3 %
HCT: 39 % (ref 36.0–46.0)
Hemoglobin: 12.8 g/dL (ref 12.0–15.0)
Lymphocytes Relative: 12 %
Lymphs Abs: 1.2 10*3/uL (ref 0.7–4.0)
MCH: 30.3 pg (ref 26.0–34.0)
MCHC: 32.8 g/dL (ref 30.0–36.0)
MCV: 92.4 fL (ref 80.0–100.0)
Monocytes Absolute: 0.5 10*3/uL (ref 0.1–1.0)
Monocytes Relative: 5 %
Neutro Abs: 8.2 10*3/uL — ABNORMAL HIGH (ref 1.7–7.7)
Neutrophils Relative %: 80 %
Platelets: 564 10*3/uL — ABNORMAL HIGH (ref 150–400)
RBC: 4.22 MIL/uL (ref 3.87–5.11)
RDW: 13.4 % (ref 11.5–15.5)
WBC: 10.3 10*3/uL (ref 4.0–10.5)
nRBC: 0 % (ref 0.0–0.2)
nRBC: 0 /100 WBC

## 2021-07-25 LAB — BASIC METABOLIC PANEL
Anion gap: 9 (ref 5–15)
BUN: 6 mg/dL — ABNORMAL LOW (ref 8–23)
CO2: 27 mmol/L (ref 22–32)
Calcium: 10 mg/dL (ref 8.9–10.3)
Chloride: 103 mmol/L (ref 98–111)
Creatinine, Ser: 0.81 mg/dL (ref 0.44–1.00)
GFR, Estimated: >60 mL/min (ref >60)
Glucose, Bld: 146 mg/dL — ABNORMAL HIGH (ref 70–99)
Potassium: 3.1 mmol/L — ABNORMAL LOW (ref 3.5–5.1)
Sodium: 139 mmol/L (ref 135–145)

## 2021-07-25 LAB — GLUCOSE, CAPILLARY
Glucose-Capillary: 129 mg/dL — ABNORMAL HIGH (ref 70–99)
Glucose-Capillary: 136 mg/dL — ABNORMAL HIGH (ref 70–99)
Glucose-Capillary: 123 mg/dL — ABNORMAL HIGH (ref 70–99)
Glucose-Capillary: 155 mg/dL — ABNORMAL HIGH (ref 70–99)

## 2021-07-25 LAB — CA 125: Cancer Antigen (CA) 125: 15.7 U/mL (ref 0.0–38.1)

## 2021-07-25 LAB — MAGNESIUM: Magnesium: 2.3 mg/dL (ref 1.7–2.4)

## 2021-07-25 MED ORDER — POTASSIUM CHLORIDE CRYS ER 20 MEQ PO TBCR
40.0000 meq | EXTENDED_RELEASE_TABLET | ORAL | Status: AC
  Administered 2021-07-25 (×2): 40 meq via ORAL
  Filled 2021-07-25 (×2): qty 2

## 2021-07-25 MED ORDER — COLCHICINE 0.6 MG PO TABS
0.6000 mg | ORAL_TABLET | Freq: Every day | ORAL | Status: DC
  Administered 2021-07-26: 0.6 mg via ORAL
  Filled 2021-07-25: qty 1

## 2021-07-25 MED ORDER — COLCHICINE 0.6 MG PO TABS
1.2000 mg | ORAL_TABLET | Freq: Once | ORAL | Status: AC
  Administered 2021-07-25: 1.2 mg via ORAL
  Filled 2021-07-25: qty 2

## 2021-07-25 NOTE — TOC Progression Note (Signed)
Transition of Care (TOC) - Progression Note  Marvetta Gibbons RN,BSN Transitions of Care Unit 4NP (Non Trauma)- RN Case Manager See Treatment Team for direct Phone #    Patient Details  Name: Jade Sanchez MRN: [id number] Date of Birth: December 07, 1940  Transition of Care [facility]) CM/SW Contact  Dahlia Client Romeo Rabon, RN Phone Number: 07/25/2021, 3:38 PM  Clinical Narrative:    Informed by PT/OT updated recommendations made for home w/ HH. Orders have been placed for HHPT/OT- CM in to speak with pt for transition needs.  Per speaking with pt at bedside- pt confirms plan to return home with assistance from family (adult children and grandchildren who live nearby)- they will not be able to stay 24/7 but can provide intermittent assistance. Pt reports she has a rollator at home but does not have 3n1/BSC- pt indorses she would like one for home. Will have DME order placed for 3n1. Pt agreeable to use in house provider for DME need and have delivered to room prior to discharge.   Discussed [place] services, list provided for [facility] choice Per CMS guidelines from medicare.gov website with star ratings (copy placed in shadow chart) , pt voiced she would like time to speak with her family regarding agencies. CM will f/u with pt tomorrow for choice.  Address, phone # and PCP all confirmed with pt in epic.   TOC to continue to follow and will see pt again tomorrow.    Expected Discharge Plan: Skilled Nursing Facility Barriers to Discharge: Continued Medical Work up  Expected Discharge Plan and Services Expected Discharge Plan: [place] In-house Referral: Clinical Social Work Discharge Planning Services: CM Consult Post Acute Care Choice: Home Health, Durable Medical Equipment Living arrangements for the past 2 months: Single Family Home                 DME Arranged: 3-N-1 DME Agency: [employer]       HH Arranged: PT, OT           Social Determinants of Health (SDOH)  Interventions    Readmission Risk Interventions No flowsheet data found.

## 2021-07-25 NOTE — Progress Notes (Signed)
CPT held at this time due to pt eating breakfast.

## 2021-07-25 NOTE — Progress Notes (Signed)
Physical Therapy Treatment Patient Details Name: Jade Sanchez MRN: [id number] DOB: [birth year] Today's Date: 07/25/2021   History of Present Illness 80 y/o female presented to ED on 11/28 for RUQ pain and fever. CT abdomen/pelvis showed R middle lobe atelectasis vs pneumonia with small R pleural effusion. Bilateral adnexal cystic masses, thickened endometrial lining  CT finding concerning for malignancy. Found to be in sepsis in setting of possible pneumonia vs atelectasis in R middle lobe. PMH: HTN, DM    PT Comments    Pt making good progress, ambulating safely with RW with no LOB and able to go 170' with RW and one standing rest break with SPO2 remaining above 90% on RA. Pt willing to use rollator at home and work with HHPT. Changing d/c rec to reflect this. PT will continue to follow.    Recommendations for follow up therapy are one component of a multi-disciplinary discharge planning process, led by the attending physician.  Recommendations may be updated based on patient status, additional functional criteria and insurance authorization.  Follow Up Recommendations  Home health PT     Assistance Recommended at Discharge Intermittent Supervision/Assistance  Equipment Recommendations  BSC/3in1 (has rollator at home)    Recommendations for Other Services       Precautions / Restrictions Precautions Precautions: Fall Restrictions Weight Bearing Restrictions: No     Mobility  Bed Mobility               General bed mobility comments: OOB in chair    Transfers Overall transfer level: Needs assistance Equipment used: Rolling walker (2 wheels) Transfers: Sit to/from Stand Sit to Stand: Supervision           General transfer comment: pt able to rise from low recliner with increased time but no physical assist    Ambulation/Gait Ambulation/Gait assistance: Supervision Gait Distance (Feet): 170 Feet Assistive device: Rolling walker (2 wheels) Gait  Pattern/deviations: Step-through pattern Gait velocity: decreased Gait velocity interpretation: 1.31 - 2.62 ft/sec, indicative of limited community ambulator   General Gait Details: working on increasing gait speed and self monitoring to know when to stop and catch breath. SpO2 remained above 90% on RA throughout gait. 1 standing rest break taken   Stairs             Wheelchair Mobility    Modified Rankin (Stroke Patients Only)       Balance Overall balance assessment: Needs assistance Sitting-balance support: Bilateral upper extremity supported;Feet supported Sitting balance-Leahy Scale: Good     Standing balance support: Bilateral upper extremity supported;Reliant on assistive device for balance Standing balance-Leahy Scale: Fair Standing balance comment: able to maintain static standing without UE support                            Cognition Arousal/Alertness: Awake/alert Behavior During Therapy: WFL for tasks assessed/performed Overall Cognitive Status: Within Functional Limits for tasks assessed                                          Exercises      General Comments General comments (skin integrity, edema, etc.): discussed home with [facility] and pt in agreement with this plan and using her rollator at home until guided by HHPT to use no AD      Pertinent Vitals/Pain Pain Assessment: No/denies pain    Home  Living                          Prior Function            PT Goals (current goals can now be found in the care plan section) Acute Rehab PT Goals Patient Stated Goal: to get stronger and be independent PT Goal Formulation: With patient Time For Goal Achievement: 08/05/21 Potential to Achieve Goals: Good Progress towards PT goals: Progressing toward goals    Frequency    Min 3X/week      PT Plan Discharge plan needs to be updated    Co-evaluation              AM-PAC PT "6 Clicks" Mobility    Outcome Measure  Help needed turning from your back to your side while in a flat bed without using bedrails?: A Little Help needed moving from lying on your back to sitting on the side of a flat bed without using bedrails?: A Little Help needed moving to and from a bed to a chair (including a wheelchair)?: A Little Help needed standing up from a chair using your arms (e.g., wheelchair or bedside chair)?: A Little Help needed to walk in hospital room?: A Little Help needed climbing 3-5 steps with a railing? : A Lot 6 Click Score: 17    End of Session Equipment Utilized During Treatment: Gait belt Activity Tolerance: Patient tolerated treatment well Patient left: in chair;with call bell/phone within reach Nurse Communication: Mobility status PT Visit Diagnosis: Unsteadiness on feet (R26.81);Muscle weakness (generalized) (M62.81);Other abnormalities of gait and mobility (R26.89)     Time: 9407-6808 PT Time Calculation (min) (ACUTE ONLY): 28 min  Charges:  $Gait Training: 23-37 mins                     Leighton Roach, [place]  Pager [phone] Office [place] 07/25/2021, 10:26 AM

## 2021-07-25 NOTE — Progress Notes (Signed)
PROGRESS NOTE    Jade Sanchez  KDT:[id number] DOB: October 19, 1940 DOA: 07/17/2021 PCP: Lavone Orn, MD   Brief Narrative:  80 years old female with past medical history of diabetes mellitus type 2 hypertension dyslipidemia chronic kidney disease stage III primary hyperparathyroidism came with a chief complaint of right upper quadrant pain vomiting and fever for the last 2 days Patient has sepsis criteria and was started empirically on Zosyn and vancomycin. He had a work-up for right upper quadrant abdominal pain with an ultrasound and HIDA scan which was negative for cholecystitis CT abdomen and pelvis showed cystic adnexal masses bilaterally and heterogeneous endometrial thickening.  Assessment & Plan:   Principal Problem:   Sepsis ([place]) Active Problems:   Chronic kidney disease, stage 3a ([place])   Essential hypertension   Hypercholesterolemia   Type 2 diabetes mellitus (HCC)   Pelvic mass   Elevated bilirubin   RUQ pain   Hyponatremia   Thickened endometrium  Sepsis secondary to community-acquired pneumonia, POA: Patient met sepsis criteria based on fever, tachycardia and tachypnea.  Sepsis parameters resolved.  All the cultures have remained negative so far.  Patient has remained on Zosyn and vancomycin and completed 7 days.  She is on room air.  Right upper quadrant abdominal pain: Resolved, HIDA scan negative for acute cholecystitis and ultrasound gallbladder showed wall thickening but no acute cholecystitis or gallstones.  MRI abdomen showed mild pancreatitis which could be the cause of her abdominal pain however that has resolved as well.  She was seen by general surgery and no hepatobiliary etiology was found as well.  Left adnexal mass/thickened endometrial lining: Ultrasound pelvis and MRI abdomen confirmed mass highly suspicious for malignancy.  GYN saw this patient.  Repeat MRI pelvis done which again shows left adnexal mass and endometrial hyperplasia, plan  for outpatient endometrial sampling but we are still waiting for gynecology to provide recommendations about the left adnexal mass.  Fortunately, her Ca125 is within normal range arguing against ovarian cancer.  Type 2 diabetes mellitus: Blood sugar controlled on SSI.  CKD stage IIIa: Stable.  Essential hypertension: Stable.  Continue current regimen.  Hypokalemia: 3.1 again today.  Will replace.  Left great toe gout: Patient complains of left great toe pain, on examination, there is minimal redness, swelling and tenderness.  This most likely is gout.  X-ray left foot also showed some swelling.  Uric acid is normal though.  Will start on colchicine.  DVT prophylaxis: enoxaparin (LOVENOX) injection 40 mg Start: 07/17/21 2200   Code Status: Full Code  Family Communication:  None present at bedside.  Plan of care discussed with patient in length and he verbalized understanding and agreed with it.  Status is: Inpatient  Remains inpatient appropriate because: Needs treatment for her gout, awaiting GYN recommendations.  Estimated body mass index is 38.37 kg/m as calculated from the following:   Height as of this encounter: $RemoveBeforeD'5\' 7"'nnLPhssSFjSTzs$  (1.702 m).   Weight as of this encounter: 111.1 kg.     Nutritional Assessment: Body mass index is 38.37 kg/m.Marland Kitchen Seen by dietician.  I agree with the assessment and plan as outlined below: Nutrition Status:   Skin Assessment: I have examined the patient's skin and I agree with the wound assessment as performed by the wound care RN as outlined below:    Consultants:  Gynecology  Procedures:  None  Antimicrobials:  Anti-infectives (From admission, onward)    Start     Dose/Rate Route Frequency Ordered Stop   07/22/21 1600  vancomycin (VANCOREADY) IVPB 1250 mg/250 mL  Status:  Discontinued        1,250 mg 166.7 mL/hr over 90 Minutes Intravenous Every 24 hours 07/22/21 0856 07/24/21 1016   07/18/21 1600  vancomycin (VANCOREADY) IVPB 1000 mg/200 mL   Status:  Discontinued        1,000 mg 200 mL/hr over 60 Minutes Intravenous Every 24 hours 07/17/21 1444 07/22/21 0856   07/17/21 2100  piperacillin-tazobactam (ZOSYN) IVPB 3.375 g        3.375 g 12.5 mL/hr over 240 Minutes Intravenous Every 8 hours 07/17/21 1755 07/24/21 1657   07/17/21 1445  vancomycin (VANCOREADY) IVPB 1750 mg/350 mL        1,750 mg 175 mL/hr over 120 Minutes Intravenous  Once 07/17/21 1444 07/17/21 1732   07/17/21 1200  piperacillin-tazobactam (ZOSYN) IVPB 3.375 g        3.375 g 100 mL/hr over 30 Minutes Intravenous  Once 07/17/21 1159 07/17/21 1312          Subjective:  Seen and examined.  Complains of left foot/great toe pain.  No other complaint.  Objective: Vitals:   07/24/21 2328 07/25/21 0326 07/25/21 0758 07/25/21 0830  BP: (!) 133/50 (!) 153/71 (!) 153/65   Pulse:   78   Resp: 20  (!) 21   Temp: 98.8 F (37.1 C) 98.4 F (36.9 C) 98.5 F (36.9 C)   TempSrc: Oral Oral Oral   SpO2:   92% 93%  Weight:      Height:        Intake/Output Summary (Last 24 hours) at 07/25/2021 1043 Last data filed at 07/24/2021 1700 Gross per 24 hour  Intake 720 ml  Output 628 ml  Net 92 ml    Filed Weights   07/17/21 2100 07/21/21 0921  Weight: 114.3 kg 111.1 kg    Examination:  General exam: Appears calm and comfortable, morbidly obese Respiratory system: Clear to auscultation. Respiratory effort normal. Cardiovascular system: S1 & S2 heard, RRR. No JVD, murmurs, rubs, gallops or clicks. No pedal edema. Gastrointestinal system: Abdomen is nondistended, soft and nontender. No organomegaly or masses felt. Normal bowel sounds heard. Central nervous system: Alert and oriented. No focal neurological deficits. Extremities: Symmetric 5 x 5 power.,  Left great toe slightly swollen, tender and warm to touch. Skin: No rashes, lesions or ulcers.  Psychiatry: Judgement and insight appear normal. Mood & affect appropriate.    Data Reviewed: I have personally  reviewed following labs and imaging studies  CBC: Recent Labs  Lab 07/20/21 0137 07/21/21 0525 07/23/21 1317 07/24/21 0421 07/25/21 0829  WBC 10.7* 9.9 12.5* 11.1* 10.3  NEUTROABS  --   --   --   --  8.2*  HGB 10.9* 11.6* 13.2 12.8 12.8  HCT 33.5* 34.5* 39.3 38.4 39.0  MCV 93.8 93.2 91.2 91.0 92.4  PLT 269 283 484* 440* 564*    Basic Metabolic Panel: Recent Labs  Lab 07/20/21 0137 07/21/21 0525 07/23/21 1317 07/24/21 0421 07/25/21 0829  NA 134* 137 138 137 139  K 4.8 4.3 3.6 3.1* 3.1*  CL 103 104 103 104 103  CO2 _0 GLUCOSE 127* 156* 163* 127* 146*  BUN _1 7* 6*  CREATININE 0.97 1.01* 0.85 0.86 0.81  CALCIUM 8.6* 9.0 9.8 9.0 10.0  MG  --   --   --   --  2.3    GFR: Estimated Creatinine Clearance: 73.6 mL/min (by C-G formula based on SCr  of 0.81 mg/dL). Liver Function Tests: Recent Labs  Lab 07/19/21 0217 07/20/21 0137  AST 29 34  ALT 28 25  ALKPHOS 52 50  BILITOT 1.0 1.8*  PROT 5.7* 5.5*  ALBUMIN 2.4* 2.3*    Recent Labs  Lab 07/20/21 0137  LIPASE 46    No results for input(s): AMMONIA in the last 168 hours. Coagulation Profile: No results for input(s): INR, PROTIME in the last 168 hours. Cardiac Enzymes: No results for input(s): CKTOTAL, CKMB, CKMBINDEX, TROPONINI in the last 168 hours. BNP (last 3 results) No results for input(s): PROBNP in the last 8760 hours. HbA1C: No results for input(s): HGBA1C in the last 72 hours. CBG: Recent Labs  Lab 07/24/21 0722 07/24/21 1140 07/24/21 1505 07/24/21 2139 07/25/21 0754  GLUCAP 126* 167* 155* 115* 129*    Lipid Profile: No results for input(s): CHOL, HDL, LDLCALC, TRIG, CHOLHDL, LDLDIRECT in the last 72 hours. Thyroid Function Tests: No results for input(s): TSH, T4TOTAL, FREET4, T3FREE, THYROIDAB in the last 72 hours. Anemia Panel: No results for input(s): VITAMINB12, FOLATE, FERRITIN, TIBC, IRON, RETICCTPCT in the last 72 hours. Sepsis Labs: Recent Labs  Lab  07/19/21 0217  PROCALCITON 0.77     Recent Results (from the past 240 hour(s))  Blood culture (routine x 2)     Status: None   Collection Time: 07/17/21 12:30 PM   Specimen: BLOOD RIGHT ARM  Result Value Ref Range Status   Specimen Description BLOOD RIGHT ARM  Final   Special Requests   Final    BOTTLES DRAWN AEROBIC AND ANAEROBIC Blood Culture adequate volume   Culture   Final    NO GROWTH 5 DAYS Performed at [facility], [address], [place], [place] [address]    Report Status 07/22/2021 FINAL  Final  Blood culture (routine x 2)     Status: None   Collection Time: 07/17/21 12:41 PM   Specimen: BLOOD  Result Value Ref Range Status   Specimen Description BLOOD SITE NOT SPECIFIED  Final   Special Requests   Final    BOTTLES DRAWN AEROBIC AND ANAEROBIC Blood Culture adequate volume   Culture   Final    NO GROWTH 5 DAYS Performed at [facility] [address], [place], [place] [address]    Report Status 07/22/2021 FINAL  Final  Resp Panel by RT-PCR (Flu A&B, Covid) Nasopharyngeal Swab     Status: None   Collection Time: 07/17/21  3:35 PM   Specimen: Nasopharyngeal Swab; Nasopharyngeal(NP) swabs in vial transport medium  Result Value Ref Range Status   SARS Coronavirus 2 by RT PCR NEGATIVE NEGATIVE Final    Comment: (NOTE) SARS-CoV-2 target nucleic acids are NOT DETECTED.  The SARS-CoV-2 RNA is generally detectable in upper respiratory specimens during the acute phase of infection. The lowest concentration of SARS-CoV-2 viral copies this assay can detect is 138 copies/mL. A negative result does not preclude SARS-Cov-2 infection and should not be used as the sole basis for treatment or other patient management decisions. A negative result may occur with  improper specimen collection/handling, submission of specimen other than nasopharyngeal swab, presence of viral mutation(s) within the areas targeted by this assay, and inadequate number of  viral copies(<138 copies/mL). A negative result must be combined with clinical observations, patient history, and epidemiological information. The expected result is Negative.  Fact Sheet for Patients:  EntrepreneurPulse.com.au  Fact Sheet for Healthcare Providers:  IncredibleEmployment.be  This test is no t yet approved or  cleared by the [place] and  has been authorized for detection and/or diagnosis of SARS-CoV-2 by FDA under an Emergency Use Authorization (EUA). This EUA will remain  in effect (meaning this test can be used) for the duration of the COVID-19 declaration under Section 564(b)(1) of the Act, 21 U.S.C.section 360bbb-3(b)(1), unless the authorization is terminated  or revoked sooner.       Influenza A by PCR NEGATIVE NEGATIVE Final   Influenza B by PCR NEGATIVE NEGATIVE Final    Comment: (NOTE) The Xpert Xpress SARS-CoV-2/FLU/RSV plus assay is intended as an aid in the diagnosis of influenza from Nasopharyngeal swab specimens and should not be used as a sole basis for treatment. Nasal washings and aspirates are unacceptable for Xpert Xpress SARS-CoV-2/FLU/RSV testing.  Fact Sheet for Patients: EntrepreneurPulse.com.au  Fact Sheet for Healthcare Providers: IncredibleEmployment.be  This test is not yet approved or cleared by the [place] FDA and has been authorized for detection and/or diagnosis of SARS-CoV-2 by FDA under an Emergency Use Authorization (EUA). This EUA will remain in effect (meaning this test can be used) for the duration of the COVID-19 declaration under Section 564(b)(1) of the Act, 21 U.S.C. section 360bbb-3(b)(1), unless the authorization is terminated or revoked.  Performed at [facility], [place] [address] [place], [place] [address]   Respiratory (~20 pathogens) panel by PCR     Status: None   Collection Time: 07/17/21  9:52 PM   Specimen:  Nasopharyngeal Swab; Respiratory  Result Value Ref Range Status   Adenovirus NOT DETECTED NOT DETECTED Final   Coronavirus 229E NOT DETECTED NOT DETECTED Final    Comment: (NOTE) The Coronavirus on the Respiratory Panel, DOES NOT test for the novel  Coronavirus (2019 nCoV)    Coronavirus HKU1 NOT DETECTED NOT DETECTED Final   Coronavirus NL63 NOT DETECTED NOT DETECTED Final   Coronavirus OC43 NOT DETECTED NOT DETECTED Final   Metapneumovirus NOT DETECTED NOT DETECTED Final   Rhinovirus / Enterovirus NOT DETECTED NOT DETECTED Final   Influenza A NOT DETECTED NOT DETECTED Final   Influenza B NOT DETECTED NOT DETECTED Final   Parainfluenza Virus 1 NOT DETECTED NOT DETECTED Final   Parainfluenza Virus 2 NOT DETECTED NOT DETECTED Final   Parainfluenza Virus 3 NOT DETECTED NOT DETECTED Final   Parainfluenza Virus 4 NOT DETECTED NOT DETECTED Final   Respiratory Syncytial Virus NOT DETECTED NOT DETECTED Final   Bordetella pertussis NOT DETECTED NOT DETECTED Final   Bordetella Parapertussis NOT DETECTED NOT DETECTED Final   Chlamydophila pneumoniae NOT DETECTED NOT DETECTED Final   Mycoplasma pneumoniae NOT DETECTED NOT DETECTED Final    Comment: Performed at [facility], [place]. [address], [place], [place] [address]  MRSA Next Gen by PCR, Nasal     Status: None   Collection Time: 07/17/21 11:05 PM   Specimen: Nasal Mucosa; Nasal Swab  Result Value Ref Range Status   MRSA by PCR Next Gen NOT DETECTED NOT DETECTED Final    Comment: (NOTE) The GeneXpert MRSA Assay (FDA approved for NASAL specimens only), is one component of a comprehensive MRSA colonization surveillance program. It is not intended to diagnose MRSA infection nor to guide or monitor treatment for MRSA infections. Test performance is not FDA approved in patients less than 79 years old. Performed at [facility], [place] [address], [place], [place] [address]        Radiology Studies: MR PELVIS W WO  CONTRAST  Result Date: 07/25/2021 CLINICAL  DATA:  Adnexal mass on recent CT. EXAM: MRI PELVIS WITHOUT AND WITH CONTRAST TECHNIQUE: Multiplanar multisequence MR imaging of the pelvis was performed both before and after administration of intravenous contrast. CONTRAST:  35mL GADAVIST GADOBUTROL 1 MMOL/ML IV SOLN COMPARISON:  CT on 07/17/2021 FINDINGS: Lower Urinary Tract: No urinary bladder or urethral abnormality identified. Bowel: Sigmoid diverticulosis is seen, without evidence of diverticulosis. Vascular/Lymphatic: Unremarkable. A few sub-cm left external iliac lymph nodes are seen, however no pathologically enlarged pelvic lymph nodes identified. Reproductive: -- Uterus: Measures 9.1 x 4.2 by 6.1 cm (volume = 120 cm^3). A subserosal fibroid is seen in the fundus which measures 1.6 cm in maximum diameter. Diffuse endometrial thickening is seen which measures 21 mm, and and heterogeneous contrast enhancement of the endometrium is also seen. These findings are highly suspicious for endometrial carcinoma in a postmenopausal female. Myometrial junctional zone remains intact, with no evidence deep myometrial invasion. Cervix and vagina are unremarkable in appearance. -- Right ovary: Normal appearance. A simple cyst is seen in the right adnexa which is separate from the right ovary, measuring 5.9 x 4.0 cm on image 36/14. -- Left ovary: A solid markedly T2 hypointense mass is seen in the left adnexa which measures 8.3 x 6.7 x 7.6 cm. This abuts the left ovary and posterior lower uterine segment of the uterus. Other: Tiny amount of free fluid noted. Musculoskeletal:  Unremarkable. IMPRESSION: Diffuse endometrial thickening measuring 21 mm, with heterogeneous contrast enhancement. These findings are highly suspicious for endometrial carcinoma in a postmenopausal female. Recommend gynecology consultation, and consideration of endometrial biopsy. 8.3 cm solid mass in the left adnexa, which abuts the left ovary and  posterior lower uterine segment. Differential diagnosis includes pedunculated fibroid and left ovarian fibrothecoma. 5.9 cm benign-appearing cyst in the right adnexa which is separate from the right ovary, consistent with benign paraovarian/paratubal cyst. If not surgically removed, recommend continued follow-up by ultrasound in 6 months. 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 No evidence of pelvic metastatic disease. Sigmoid diverticulosis, without evidence of diverticulosis. Electronically Signed   By: Marlaine Hind M.D.   On: 07/25/2021 08:53   DG Chest Port 1 View  Result Date: 07/24/2021 CLINICAL DATA:  Follow-up pneumonia. EXAM: PORTABLE CHEST 1 VIEW COMPARISON:  Chest CT 07/21/2021, portable chest 07/20/2021 FINDINGS: Dense right middle lobe consolidation, on CT seen throughout the lateral segment of the right middle lobe, is again noted and associated with layering right pleural effusion, with minimal left pleural effusion again also seen. There is stable atelectasis at the left base. Remainder of the lungs are clear. The cardiac size is normal. Calcifications are again noted in the aortic arch. IMPRESSION: Right middle lobe pneumonia and parapneumonic effusion with stable overall aeration. Electronically Signed   By: Telford Nab M.D.   On: 07/24/2021 06:01   DG Foot 2 Views Left  Result Date: 07/23/2021 CLINICAL DATA:  Sepsis, right foot pain EXAM: LEFT FOOT - 2 VIEW COMPARISON:  05/01/2017 FINDINGS: Normal alignment. No acute fracture or dislocation. Joint spaces are preserved. No focal erosions. Small plantar calcaneal spur. Mild diffuse soft tissue swelling of the left foot is noted, more severe involving the forefoot IMPRESSION: Soft tissue swelling.  No fracture or dislocation. Electronically Signed   By: Fidela Salisbury M.D.   On: 07/23/2021 20:34    Scheduled Meds:  colchicine  1.2 mg Oral Once   Followed by   Derrill Memo ON 07/26/2021] colchicine  0.6 mg Oral Daily   enoxaparin  (LOVENOX)  injection  40 mg Subcutaneous Q24H   furosemide  40 mg Oral Daily   guaiFENesin  600 mg Oral BID   insulin aspart  0-15 Units Subcutaneous TID WC   irbesartan  300 mg Oral Daily   mometasone-formoterol  2 puff Inhalation BID   rosuvastatin  5 mg Oral Daily   Continuous Infusions:     LOS: 8 days   Time spent: 28 minutes   Darliss Cheney, MD [facility]  07/25/2021, 10:43 AM  Please page via [place] [place] and do not message via secure chat for anything urgent. Secure chat can be used for anything non urgent.  How to contact the [facility] Attending or Consulting provider [place] or covering provider during after hours [place], for this patient?  Check the care team in [facility] and look for a) attending/consulting [facility] provider listed and b) the [facility] team listed. Page or secure chat [phone]. Log into [email or web address] and use [place]'s universal password to access. If you do not have the password, please contact the hospital operator. Locate the [facility] provider you are looking for under [facility] and page to a number that you can be directly reached. If you still have difficulty reaching the provider, please page the [facility] (Director on Call) for the Hospitalists listed on amion for assistance.

## 2021-07-25 NOTE — Progress Notes (Signed)
Patient ID: [facility], female   DOB: 17-Mar-1941, 80 y.o.   MRN: [id number]   Assessment/Plan: Principal Problem:   Sepsis ([place]) Active Problems:   Chronic kidney disease, stage 3a ([place])   Essential hypertension   Hypercholesterolemia   Type 2 diabetes mellitus (H[facility])   Pelvic mass   Elevated bilirubin   RUQ pain   Hyponatremia   Thickened endometrium  Thickened endometrium MRI is very concerning for possible endometrial hyperplasia or carcinoma.  This will definitely need outpatient follow-up.  Have [facility] the [facility].  Bilateral pelvic masses On the right this is a simple cyst is fluid-filled consistent with paratubal cyst and may be related to prior BTL.   On the left, the mass is solid and large and could be a solid ovarian tumor or pedunculated fibroid.  Reassuringly there is no evidence of intra-abdominal metastases.  Additionally the CA125 is normal.  Following an office endometrial sampling decision will be made about neck steps.  The patient may need GYN oncology to follow-up at which point the ovarian mass might also be addressed.  Patient is concerned about of large surgery and this will be addressed as an outpatient as well.  If she is a candidate for progesterone containing IUD could follow these masses with serial imaging. GYN will sign off.  Outpatient follow-up is arranged.   Subjective: Interval History: Patient has undergone her MRI.  She has no new pelvic complaints.  She denies vaginal bleeding.  Objective: Vital signs in last 24 hours: Temp:  [97.9 F (36.6 C)-98.8 F (37.1 C)] 97.9 F (36.6 C) (12/06 1212) Pulse Rate:  [76-79] 76 (12/06 1212) Resp:  [20-22] 22 (12/06 1212) BP: (133-153)/(50-75) 135/75 (12/06 1212) SpO2:  [92 %-98 %] 98 % (12/06 1212)  Intake/Output from previous day: 12/05 0701 - 12/06 0700 In: 720 [P.O.:720] Out: 628 [Urine:625] Intake/Output this  shift: Total I/O In: 600 [P.O.:600] Out: -   General appearance: alert, cooperative, appears stated age, and moderately obese Head: Normocephalic, without obvious abnormality, atraumatic Neck: supple, symmetrical, trachea midline Lungs: Normal effort Heart: regular rate and rhythm Abdomen: soft, non-tender; bowel sounds normal; no masses,  no organomegaly Extremities: extremities normal, atraumatic, no cyanosis or edema Skin: Skin color, texture, turgor normal. No rashes or lesions Neurologic: Grossly normal  Results for orders placed or performed during the hospital encounter of 07/17/21 (from the past 24 hour(s))  Glucose, capillary     Status: Abnormal   Collection Time: 07/24/21  3:05 PM  Result Value Ref Range   Glucose-Capillary 155 (H) 70 - 99 mg/dL   Comment 1 Notify RN    Comment 2 Document in Chart   Glucose, capillary     Status: Abnormal   Collection Time: 07/24/21  9:39 PM  Result Value Ref Range   Glucose-Capillary 115 (H) 70 - 99 mg/dL  Glucose, capillary     Status: Abnormal   Collection Time: 07/25/21  7:54 AM  Result Value Ref Range   Glucose-Capillary 129 (H) 70 - 99 mg/dL  CBC with Differential/Platelet     Status: Abnormal   Collection Time: 07/25/21  8:29 AM  Result Value Ref Range   WBC 10.3 4.0 - 10.5 K/uL   RBC 4.22 3.87 - 5.11 MIL/uL   Hemoglobin 12.8 12.0 - 15.0 g/dL   HCT 39.0 36.0 - 46.0 %   MCV 92.4 80.0 - 100.0 fL   MCH 30.3 26.0 - 34.0 pg  MCHC 32.8 30.0 - 36.0 g/dL   RDW 13.4 11.5 - 15.5 %   Platelets 564 (H) 150 - 400 K/uL   nRBC 0.0 0.0 - 0.2 %   Neutrophils Relative % 80 %   Neutro Abs 8.2 (H) 1.7 - 7.7 K/uL   Lymphocytes Relative 12 %   Lymphs Abs 1.2 0.7 - 4.0 K/uL   Monocytes Relative 5 %   Monocytes Absolute 0.5 0.1 - 1.0 K/uL   Eosinophils Relative 3 %   Eosinophils Absolute 0.3 0.0 - 0.5 K/uL   Basophils Relative 0 %   Basophils Absolute 0.0 0.0 - 0.1 K/uL   WBC Morphology See Note    nRBC 0 0 /100 WBC   Abs Immature  Granulocytes 0.00 0.00 - 0.07 K/uL  Basic metabolic panel     Status: Abnormal   Collection Time: 07/25/21  8:29 AM  Result Value Ref Range   Sodium 139 135 - 145 mmol/L   Potassium 3.1 (L) 3.5 - 5.1 mmol/L   Chloride 103 98 - 111 mmol/L   CO2 27 22 - 32 mmol/L   Glucose, Bld 146 (H) 70 - 99 mg/dL   BUN 6 (L) 8 - 23 mg/dL   Creatinine, Ser 0.81 0.44 - 1.00 mg/dL   Calcium 10.0 8.9 - 10.3 mg/dL   GFR, Estimated >60 >60 mL/min   Anion gap 9 5 - 15  Magnesium     Status: None   Collection Time: 07/25/21  8:29 AM  Result Value Ref Range   Magnesium 2.3 1.7 - 2.4 mg/dL  Glucose, capillary     Status: Abnormal   Collection Time: 07/25/21 10:52 AM  Result Value Ref Range   Glucose-Capillary 136 (H) 70 - 99 mg/dL    Studies/Results: CT CHEST W CONTRAST  Result Date: 07/21/2021 CLINICAL DATA:  Chest pain and shortness of breath. EXAM: CT CHEST WITH CONTRAST TECHNIQUE: Multidetector CT imaging of the chest was performed during intravenous contrast administration. CONTRAST:  66mL OMNIPAQUE IOHEXOL 350 MG/ML SOLN COMPARISON:  Chest radiograph 07/20/2021 FINDINGS: Cardiovascular: No significant vascular findings. Normal heart size. No pericardial effusion. Mediastinum/Nodes: No enlarged mediastinal, hilar, or axillary lymph nodes. Thyroid gland, trachea, and esophagus demonstrate no significant findings. Lungs/Pleura: Peribronchovascular consolidation with adjacent mild ground-glass opacities involves the entire lateral segment of the right middle lobe. Moderate right and trace left pleural effusions with adjacent compressive atelectasis in the lower lobes. Upper Abdomen: No acute abnormality. Musculoskeletal: No chest wall abnormality. No acute or significant osseous findings. IMPRESSION: 1. Bronchopneumonia involving the entire lateral segment of the right middle lobe. 2. Moderate right and trace left pleural effusions with adjacent compressive atelectasis in the lower lobes. Electronically Signed    By: Ileana Roup M.D.   On: 07/21/2021 15:58   MR PELVIS W WO CONTRAST  Result Date: 07/25/2021 CLINICAL DATA:  Adnexal mass on recent CT. EXAM: MRI PELVIS WITHOUT AND WITH CONTRAST TECHNIQUE: Multiplanar multisequence MR imaging of the pelvis was performed both before and after administration of intravenous contrast. CONTRAST:  62mL GADAVIST GADOBUTROL 1 MMOL/ML IV SOLN COMPARISON:  CT on 07/17/2021 FINDINGS: Lower Urinary Tract: No urinary bladder or urethral abnormality identified. Bowel: Sigmoid diverticulosis is seen, without evidence of diverticulosis. Vascular/Lymphatic: Unremarkable. A few sub-cm left external iliac lymph nodes are seen, however no pathologically enlarged pelvic lymph nodes identified. Reproductive: -- Uterus: Measures 9.1 x 4.2 by 6.1 cm (volume = 120 cm^3). A subserosal fibroid is seen in the fundus which measures 1.6 cm in maximum  diameter. Diffuse endometrial thickening is seen which measures 21 mm, and and heterogeneous contrast enhancement of the endometrium is also seen. These findings are highly suspicious for endometrial carcinoma in a postmenopausal female. Myometrial junctional zone remains intact, with no evidence deep myometrial invasion. Cervix and vagina are unremarkable in appearance. -- Right ovary: Normal appearance. A simple cyst is seen in the right adnexa which is separate from the right ovary, measuring 5.9 x 4.0 cm on image 36/14. -- Left ovary: A solid markedly T2 hypointense mass is seen in the left adnexa which measures 8.3 x 6.7 x 7.6 cm. This abuts the left ovary and posterior lower uterine segment of the uterus. Other: Tiny amount of free fluid noted. Musculoskeletal:  Unremarkable. IMPRESSION: Diffuse endometrial thickening measuring 21 mm, with heterogeneous contrast enhancement. These findings are highly suspicious for endometrial carcinoma in a postmenopausal female. Recommend gynecology consultation, and consideration of endometrial biopsy. 8.3 cm solid  mass in the left adnexa, which abuts the left ovary and posterior lower uterine segment. Differential diagnosis includes pedunculated fibroid and left ovarian fibrothecoma. 5.9 cm benign-appearing cyst in the right adnexa which is separate from the right ovary, consistent with benign paraovarian/paratubal cyst. If not surgically removed, recommend continued follow-up by ultrasound in 6 months. 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 No evidence of pelvic metastatic disease. Sigmoid diverticulosis, without evidence of diverticulosis. Electronically Signed   By: Marlaine Hind M.D.   On: 07/25/2021 08:53   NM Hepatobiliary Liver Func  Result Date: 07/18/2021 CLINICAL DATA:  Abdominal pain. EXAM: NUCLEAR MEDICINE HEPATOBILIARY IMAGING TECHNIQUE: Sequential images of the abdomen were obtained out to 60 minutes following intravenous administration of radiopharmaceutical. RADIOPHARMACEUTICALS:  5.3 mCi Tc-27m  Choletec IV COMPARISON:  CT abdomen pelvis, 07/17/2021 FINDINGS: Prompt uptake and biliary excretion of activity by the liver is seen. Gallbladder activity is visualized, consistent with patency of cystic duct. Biliary activity passes into small bowel, consistent with patent common bile duct. IMPRESSION: 1.  The cystic and common bile ducts are patent. 2. Gallbladder ejection fraction was not assessed on this examination. Please note that gallbladder ejection fraction measurement is not reliable or validated in the acute inpatient setting and assessment for gallbladder dysmotility should generally be deferred for outpatient evaluation. Electronically Signed   By: Delanna Ahmadi M.D.   On: 07/18/2021 09:14   US PELVIS LIMITED (TRANSABDOMINAL ONLY)  Result Date: 07/17/2021 CLINICAL DATA:  Abnormal CT. EXAM: TRANSABDOMINAL ULTRASOUND OF PELVIS TECHNIQUE: Transabdominal ultrasound examination of the pelvis was performed including evaluation of the uterus, ovaries, adnexal regions, and pelvic cul-de-sac.  COMPARISON:  Abdominal ultrasound and CT abdomen and pelvis 07/17/2021. FINDINGS: Uterus Measurements: 8.1 x 4.0 x 4.9 cm = volume: 82 mL. 1.8 x 0.7 x 1.9 cm calcified exophytic fibroid. Myometrium otherwise within normal limits. Endometrium Thickness: 9 mm.  Difficult to visualize.  Appears echogenic. Right ovary Not visualized. Left ovary There is a 5.7 x 6.4 x 7.2 cm mass in the left adnexa. This is slightly hypoechoic. Separate normal appearing left ovary is not visualized. Other findings:  No abnormal free fluid. IMPRESSION: 1. Technically limited study. 2. Indeterminate left adnexal mass measuring 7.2 cm may be related to ovarian malignancy or other ovarian tumor. 3. Endometrium appears thickened and echogenic. Differential diagnosis includes endometrial hyperplasia, polyp or carcinoma. 4. Right ovary not visualized. 5. Small calcified uterine fibroid. Electronically Signed   By: Ronney Asters M.D.   On: 07/17/2021 17:23   CT ABDOMEN PELVIS W CONTRAST  Result  Date: 07/17/2021 CLINICAL DATA:  Constant severe right upper quadrant abdominal pain since yesterday. Fever. Intermittent vomiting. EXAM: CT ABDOMEN AND PELVIS WITH CONTRAST TECHNIQUE: Multidetector CT imaging of the abdomen and pelvis was performed using the standard protocol following bolus administration of intravenous contrast. CONTRAST:  38mL OMNIPAQUE IOHEXOL 300 MG/ML  SOLN COMPARISON:  None. FINDINGS: Lower chest: Dense consolidation in the posterior right middle lobe with small air bronchograms. Mild patchy and linear atelectasis in the right lower lobe. Small right pleural effusion. Mild atelectasis at the left lung base. Hepatobiliary: Borderline dilated gallbladder, measuring 10.3 cm in length. No visible gallstones. No gallbladder wall thickening or pericholecystic fluid. Two small cysts in the left lobe of the liver. Pancreas: Unremarkable. No pancreatic ductal dilatation or surrounding inflammatory changes. Spleen: Normal in size  without focal abnormality. Adrenals/Urinary Tract: 2.1 cm homogeneously fat density mass in the lateral right kidney. Unremarkable left kidney, ureters, urinary bladder and adrenal glands. Stomach/Bowel: Multiple sigmoid and distal descending colon diverticula without evidence of diverticulitis. Fluid in the right colon without wall thickening. Unremarkable stomach, small bowel and appendix. Vascular/Lymphatic: Atheromatous arterial calcifications without aneurysm. No enlarged lymph nodes. Reproductive: Normal sized uterus containing a 1.4 cm anterior exophytic densely calcified fibroid. Heterogeneous low density in the endometrial cavity measuring 3.4 x 2.3 cm on image number 74/3. Cystic mass posterior to the uterus on the right which without supper identification of the right ovary. This measures 7.0 x 4.2 cm on image number 75/3. Heterogeneous solid mass posterior to the uterus on the left without separate identification of the left ovary. This mass measures 9.1 x 7.8 cm on image number 74/3. Other: Small umbilical hernia containing fat. No free peritoneal air seen. Minimal free fluid in the left posterior para colonic gutter. Musculoskeletal: Lumbar and lower thoracic spine degenerative changes with associated grade 1 anterolisthesis at the L4-5 level. No pars defects are seen. Mild scoliosis. IMPRESSION: 1. Dense right middle lobe atelectasis or pneumonia. 2. Small right pleural effusion. 3. Mild bibasilar atelectasis. 4. 7.0 cm cystic right adnexal mass and 9.1 cm solid left adnexal mass. These could be further evaluated with elective pelvic ultrasound or MRI of the pelvis. 5. Thickened, heterogeneous endometrium, suspicious for endometrial carcinoma. 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 7. Sigmoid and distal descending colon diverticulosis. Electronically Signed   By: Claudie Revering M.D.   On: 07/17/2021 13:57   US Abdomen Limited  Result Date: 07/17/2021 CLINICAL DATA:  RIGHT upper quadrant pain. EXAM:  ULTRASOUND ABDOMEN LIMITED RIGHT UPPER QUADRANT COMPARISON:  CT abdomen from earlier same day. FINDINGS: Gallbladder: Gallbladder is distended. Portions of the gallbladder are suboptimally visualized. Questionable gallbladder wall thickening at the fundus. No pericholecystic fluid. No sonographic Murphy's sign elicited during the exam, per the sonographer. Common bile duct: Diameter: 4 mm Liver: No focal lesion identified. Generalized hyperechogenicity suggesting some degree of fatty infiltration. Portal vein is patent on color Doppler imaging with normal direction of blood flow towards the liver. Other: None. IMPRESSION: 1. No convincing evidence of acute cholecystitis. No gallstones seen. Questionable gallbladder wall thickening at the fundus. If localizable RIGHT upper quadrant pain persists or worsens, consider nuclear medicine HIDA scan for further characterization. 2. No bile duct dilatation. 3. Probable fatty infiltration of the liver. Electronically Signed   By: Franki Cabot M.D.   On: 07/17/2021 14:32   DG Chest Port 1 View  Result Date: 07/24/2021 CLINICAL DATA:  Follow-up pneumonia. EXAM: PORTABLE CHEST 1 VIEW COMPARISON:  Chest CT 07/21/2021, portable chest  07/20/2021 FINDINGS: Dense right middle lobe consolidation, on CT seen throughout the lateral segment of the right middle lobe, is again noted and associated with layering right pleural effusion, with minimal left pleural effusion again also seen. There is stable atelectasis at the left base. Remainder of the lungs are clear. The cardiac size is normal. Calcifications are again noted in the aortic arch. IMPRESSION: Right middle lobe pneumonia and parapneumonic effusion with stable overall aeration. Electronically Signed   By: Telford Nab M.D.   On: 07/24/2021 06:01   DG CHEST PORT 1 VIEW  Result Date: 07/17/2021 CLINICAL DATA:  Right upper quadrant pain EXAM: PORTABLE CHEST 1 VIEW COMPARISON:  02/06/2005, CT 07/17/2021 FINDINGS:  Consolidation in the right lung base. No pleural effusion. Cardiomediastinal silhouette within normal limits. Aortic atherosclerosis. No pneumothorax IMPRESSION: Consolidation in the right lung base is suspicious for pneumonia. Radiographic follow-up to resolution is recommended Electronically Signed   By: Donavan Foil M.D.   On: 07/17/2021 23:06   DG Chest Port 1V same Day  Result Date: 07/20/2021 CLINICAL DATA:  Shortness of breath and wheezing, chest pain EXAM: PORTABLE CHEST 1 VIEW COMPARISON:  Chest radiograph dated July 17, 2021 FINDINGS: The heart is mildly enlarged. Atherosclerotic calcification of aortic arch, unchanged. Redemonstration of right lower lobe opacity, which have slightly increased from prior examination concerning for atelectasis/pneumonia. Small right pleural effusion. No appreciable pneumothorax. IMPRESSION: 1. The right lower lobe opacity concerning for pneumonia/atelectasis with small pleural effusion, increased in size since prior examination. Follow-up examination to resolution is recommended. 2.  Stable cardiomegaly. Electronically Signed   By: Keane Police D.O.   On: 07/20/2021 10:46   DG Foot 2 Views Left  Result Date: 07/23/2021 CLINICAL DATA:  Sepsis, right foot pain EXAM: LEFT FOOT - 2 VIEW COMPARISON:  05/01/2017 FINDINGS: Normal alignment. No acute fracture or dislocation. Joint spaces are preserved. No focal erosions. Small plantar calcaneal spur. Mild diffuse soft tissue swelling of the left foot is noted, more severe involving the forefoot IMPRESSION: Soft tissue swelling.  No fracture or dislocation. Electronically Signed   By: Fidela Salisbury M.D.   On: 07/23/2021 20:34   MR ABDOMEN MRCP W WO CONTAST  Result Date: 07/20/2021 CLINICAL DATA:  Right upper quadrant abdominal pain EXAM: MRI ABDOMEN WITHOUT AND WITH CONTRAST (INCLUDING MRCP) TECHNIQUE: Multiplanar multisequence MR imaging of the abdomen was performed both before and after the administration of  intravenous contrast. Heavily T2-weighted images of the biliary and pancreatic ducts were obtained, and three-dimensional MRCP images were rendered by post processing. CONTRAST:  78mL GADAVIST GADOBUTROL 1 MMOL/ML IV SOLN COMPARISON:  07/17/2021 FINDINGS: Exam detail is diminished due to motion artifact. This particularly affects the postcontrast images. Lower chest: No acute findings. Hepatobiliary: Diffuse hepatic steatosis. 1.1 cm, well-circumscribed T2 hyperintense structure within segment 2 of the left hepatic lobe is identified compatible with a simple cyst, image 28/6. The gallbladder appears moderately distended. No wall thickening. Mild pericholecystic fluid. No gallstones identified. The common bile duct has a normal caliber. No signs of choledocholithiasis. Pancreas: There is mild pancreatic edema with peripancreatic fluid surrounding the head and neck of pancreas concerning for acute pancreatitis. No main duct dilatation or mass identified. Spleen:  Within normal limits in size and appearance. Adrenals/Urinary Tract: Normal adrenal glands. 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 No hydronephrosis identified bilaterally. Stomach/Bowel: Visualized portions within the abdomen are unremarkable. Vascular/Lymphatic: Aortic atherosclerosis without aneurysm.  Upper abdominal vascularity appears patent. No abdominal adenopathy. Other: Small volume of peripancreatic ascites identified. No focal fluid collections identified. Musculoskeletal: No suspicious bone lesions identified. IMPRESSION: 1. Mild pancreatic edema with peripancreatic fluid surrounding the head and neck of pancreas concerning for acute pancreatitis. No main duct dilatation or mass identified. 2. No gallstones or evidence of choledocholithiasis. 3. Mild hepatic steatosis. 4. Right kidney angiomyolipoma. Electronically Signed   By: Kerby Moors M.D.   On:  07/20/2021 06:30    Scheduled Meds:  [START ON 07/26/2021] colchicine  0.6 mg Oral Daily   enoxaparin (LOVENOX) injection  40 mg Subcutaneous Q24H   furosemide  40 mg Oral Daily   guaiFENesin  600 mg Oral BID   insulin aspart  0-15 Units Subcutaneous TID WC   irbesartan  300 mg Oral Daily   mometasone-formoterol  2 puff Inhalation BID   potassium chloride  40 mEq Oral Q4H   rosuvastatin  5 mg Oral Daily   Continuous Infusions: PRN Meds:acetaminophen **OR** acetaminophen, HYDROmorphone (DILAUDID) injection, ipratropium-albuterol, ipratropium-albuterol, ondansetron **OR** ondansetron (ZOFRAN) IV, oxyCODONE    LOS: 8 days   Donnamae Jude, MD [phone] 07/25/2021 1:20 PM

## 2021-07-26 ENCOUNTER — Other Ambulatory Visit (HOSPITAL_COMMUNITY): Payer: Self-pay

## 2021-07-26 DIAGNOSIS — A419 Sepsis, unspecified organism: Secondary | ICD-10-CM | POA: Diagnosis not present

## 2021-07-26 DIAGNOSIS — M109 Gout, unspecified: Secondary | ICD-10-CM

## 2021-07-26 LAB — BASIC METABOLIC PANEL
Anion gap: 9 (ref 5–15)
BUN: 6 mg/dL — ABNORMAL LOW (ref 8–23)
CO2: 26 mmol/L (ref 22–32)
Calcium: 9.9 mg/dL (ref 8.9–10.3)
Chloride: 104 mmol/L (ref 98–111)
Creatinine, Ser: 0.84 mg/dL (ref 0.44–1.00)
GFR, Estimated: >60 mL/min (ref >60)
Glucose, Bld: 136 mg/dL — ABNORMAL HIGH (ref 70–99)
Potassium: 3.6 mmol/L (ref 3.5–5.1)
Sodium: 139 mmol/L (ref 135–145)

## 2021-07-26 LAB — MAGNESIUM: Magnesium: 2.3 mg/dL (ref 1.7–2.4)

## 2021-07-26 LAB — GLUCOSE, CAPILLARY
Glucose-Capillary: 131 mg/dL — ABNORMAL HIGH (ref 70–99)
Glucose-Capillary: 161 mg/dL — ABNORMAL HIGH (ref 70–99)

## 2021-07-26 MED ORDER — COLCHICINE 0.6 MG PO TABS
0.6000 mg | ORAL_TABLET | Freq: Every day | ORAL | 0 refills | Status: DC
  Filled 2021-07-26: qty 5, 5d supply, fill #0

## 2021-07-26 NOTE — Progress Notes (Signed)
Occupational Therapy Treatment Note  PT sen with focus on ADL retraining and understanding strategies to reduce risk of falls. Pt continues to progress and appropriate for DC home with HHOT. Will need 3in1 and RW for safe DC  home. Discussed recommendation for pt to have a Fall Alert system. Pt verbalized understanding.     07/26/21 1000  OT Visit Information  Last OT Received On 07/26/21  Assistance Needed +1  History of Present Illness 80 y/o female presented to ED on 11/28 for RUQ pain and fever. CT abdomen/pelvis showed R middle lobe atelectasis vs pneumonia with small R pleural effusion. Bilateral adnexal cystic masses, thickened endometrial lining  CT finding concerning for malignancy. Found to be in sepsis in setting of possible pneumonia vs atelectasis in R middle lobe. PMH: HTN, DM  Precautions  Precautions Fall  Pain Assessment  Pain Assessment Faces  Faces Pain Scale 2  Pain Location L foot  Pain Descriptors / Indicators Discomfort  Pain Intervention(s) Limited activity within patient's tolerance  Cognition  Arousal/Alertness Awake/alert  Behavior During Therapy WFL for tasks assessed/performed  Overall Cognitive Status Within Functional Limits for tasks assessed  Upper Extremity Assessment  Upper Extremity Assessment Generalized weakness  ADL  General ADL Comments Educated pt on safe trasnfer technique in/out of walk- in shower. Educated on strateiges to reduce risk of falls, inclduing use of RW rather than rollator at this time in addition to removing all scatter/throw rugs and sitting primarily to perform ADL tasks. Recommend pt have grab bars installed in her shower.also recommend that pt only shower at thistime with OT/family present. Pt verbalized understanding. also discussed recommendation for fall alert system  Bed Mobility  General bed mobility comments OOB in chair  Transfers  Overall transfer level Modified independent  Equipment used Rolling walker (2 wheels)   Balance  Sitting balance-Leahy Scale Good  Standing balance-Leahy Scale Fair  General Comments  General comments (skin integrity, edema, etc.) VSS  Other Exercises  Other Exercises completing level 1 theraband onher own  OT - End of Session  Equipment Utilized During Treatment Rolling walker (2 wheels)  Activity Tolerance Patient tolerated treatment well  Patient left in chair;with call bell/phone within reach  Nurse Communication Other (comment) (DC needs)  OT Assessment/Plan  OT Plan Discharge plan needs to be updated  OT Visit Diagnosis Unsteadiness on feet (R26.81);Muscle weakness (generalized) (M62.81);Pain  Pain - Right/Left Left  Pain - part of body Ankle and joints of foot  OT Frequency (ACUTE ONLY) Min 2X/week  Follow Up Recommendations Home health OT  Assistance recommended at discharge Intermittent Supervision/Assistance  OT Equipment BSC/3in1;Other (comment) (RW)  AM-PAC OT "6 Clicks" Daily Activity Outcome Measure (Version 2)  Help from another person eating meals? 4  Help from another person taking care of personal grooming? 3  Help from another person toileting, which includes using toliet, bedpan, or urinal? 3  Help from another person bathing (including washing, rinsing, drying)? 3  Help from another person to put on and taking off regular upper body clothing? 3  Help from another person to put on and taking off regular lower body clothing? 3  6 Click Score 19  Progressive Mobility  What is the highest level of mobility based on the progressive mobility assessment? Level 5 (Walks with assist in room/hall) - Balance while stepping forward/back and can walk in room with assist - Complete  Mobility Out of bed to chair with meals;Out of bed for toileting  OT Goal Progression  Progress  towards OT goals Progressing toward goals  Acute Rehab OT Goals  Patient Stated Goal to get stronger  OT Goal Formulation With patient  Time For Goal Achievement 08/06/21   Potential to Achieve Goals Good  ADL Goals  Pt Will Perform Lower Body Bathing with modified independence;sit to/from stand  Pt Will Perform Lower Body Dressing with modified independence;sit to/from stand  Pt Will Transfer to Toilet with modified independence;ambulating  Pt Will Perform Toileting - Clothing Manipulation and hygiene with modified independence  Additional ADL Goal #1 PT will verbalize 3 strategies to reduce risk of falls  OT Time Calculation  OT Start Time (ACUTE ONLY) 0945  OT Stop Time (ACUTE ONLY) 1010  OT Time Calculation (min) 25 min  OT General Charges  $OT Visit 1 Visit  OT Treatments  $Self Care/Home Management  23-37 mins  Maurie Boettcher, OT/L   Acute OT Clinical Specialist [place] Pager [phone] Office [phone]

## 2021-07-26 NOTE — Progress Notes (Signed)
Pt with d/c orders home with [facility]. Pt stable. IV removed. D/C instructions given to pt all questions answered. TOC meds delivered to pt prior to d/c and in belongings at time of d/c. Pt transported with all belongings via w/c to private vehicle without incident.   Renee Ramus, RN

## 2021-07-26 NOTE — TOC Transition Note (Signed)
Transition of Care (TOC) - CM/SW Discharge Note Marvetta Gibbons RN,BSN Transitions of Care Unit 4NP (Non Trauma)- RN Case Manager See Treatment Team for direct Phone #     Patient Details  Name: Jade Sanchez MRN: [id number] Date of Birth: 1941/07/06  Transition of Care [facility] CM/SW Contact:  Dawayne Reily, RN Phone Number: 07/26/2021, 12:47 PM   Clinical Narrative:    Pt stable for transition home today, Follow up done with pt for [facility] choice- per pt she reviewed with family and they have selected [facility] as first choice with [facility] as a backup.  Pt also requesting a RW for home as she feels she will be able to use it in the home better than the rollator- (pt reports she bought the rollator out of pocket so insurance will cover RW)- order has been placed.  Pt and OT also discussed medical alert options for home- pt provided some resources for this that she can review with her family and research on the Internet different options available.   Call made to [facility] for DME referral- 3n1 and RW- DME to be delivered to room prior to discharge   Call made to [facility] with [facility] for HHPT/OT- referral has been accepted- [facility] also request RN to be added for disease management- HH order has been modified as per request.   Family to transport home once DME has been delivered to room.     Final next level of care: [place] Barriers to Discharge: Barriers Resolved   Patient Goals and CMS Choice Patient states their goals for this hospitalization and ongoing recovery are:: "get back to my active lifestyle" CMS Medicare.gov Compare Post Acute Care list provided to:: Patient Choice offered to / list presented to : Patient  Discharge Placement               Home w/ [facility]        Discharge Plan and Services In-house Referral: Clinical Social Work Discharge Planning Services: CM Consult Post Acute Care Choice: Home Health, Durable Medical Equipment           DME Arranged: 3-N-1, Walker rolling DME Agency: [facility] Date DME Agency Contacted: 07/26/21 Time DME Agency Contacted: [phone] Representative spoke with at DME Agency: Freda Munro HH Arranged: RN, Disease Management, PT, OT [place] Agency: [place] Date [place]: 07/26/21 Time [place]: 56 Representative spoke with at [place]: [place] Interventions     Readmission Risk Interventions Readmission Risk Prevention Plan 07/26/2021  Post Dischage Appt Complete  Medication Screening Complete  Transportation Screening Complete  Some recent data might be hidden

## 2021-07-26 NOTE — Discharge Summary (Signed)
Physician Discharge Summary  Jade Sanchez FKC:[id number] DOB: 12/16/1940 DOA: 07/17/2021  PCP: Lavone Orn, MD  Admit date: 07/17/2021 Discharge date: 07/26/2021 30 Day Unplanned Readmission Risk Score    Flowsheet Row ED to Hosp-Admission (Current) from 07/17/2021 in [place]  30 Day Unplanned Readmission Risk Score (%) 11.25 Filed at 07/26/2021 0801       This score is the patient's risk of an unplanned readmission within 30 days of being discharged (0 -100%). The score is based on dignosis, age, lab data, medications, orders, and past utilization.   Low:  0-14.9   Medium: 15-21.9   High: 22-29.9   Extreme: 30 and above          Admitted From: Home Disposition: Home  Recommendations for Outpatient Follow-up:  Follow up with PCP in 1-2 weeks Please obtain BMP/CBC in one week. Follow-up with GYN as a scheduled Please follow up with your PCP on the following pending results: Unresulted Labs (From admission, onward)    None         Home Health: Yes Equipment/Devices: Bedside commode  Discharge Condition: Stable CODE STATUS: Full code Diet recommendation: Cardiac  Subjective: Seen and examined.  No complaints.  Feels better and she states that she is improving gradually.  Left toe pain is also improved.  Brief/Interim Summary: 80 years old female with past medical history of diabetes mellitus type 2 hypertension dyslipidemia chronic kidney disease stage IIIa, primary hyperparathyroidism came with a chief complaint of right upper quadrant pain vomiting and fever for the last 2 days. Patient has sepsis criteria and was started empirically on Zosyn and vancomycin. She had a work-up for right upper quadrant abdominal pain with an ultrasound and HIDA scan which was negative for cholecystitis. CT abdomen and pelvis showed cystic adnexal masses bilaterally and heterogeneous endometrial thickening.  She was diagnosed with  community-acquired pneumonia which seems to be the cause of her sepsis.  She received 7 days of antibiotics and sepsis and pneumonia both resolved.  Right upper quadrant abdominal pain: Resolved, HIDA scan negative for acute cholecystitis and ultrasound gallbladder showed wall thickening but no acute cholecystitis or gallstones.  MRI abdomen showed mild pancreatitis which could be the cause of her abdominal pain however that has resolved as well.  She was seen by general surgery and no hepatobiliary etiology was found as well.   Bilateral pelvic masses/thickened endometrial lining: Ultrasound pelvis and MRI abdomen confirmed mass in the left adnexa highly suspicious for malignancy.  GYN saw this patient.  Repeat MRI pelvis done which again shows left adnexal mass and endometrial hyperplasia, on the right side there is simple cyst which is fluid-filled consistent with paratubal cyst.  Seen by GYN, plan for outpatient endometrial sampling followed by neck steps for the adnexal mass.  Fortunately, her Ca125 is within normal range.   Type 2 diabetes mellitus: Blood sugar controlled, resume home medications.   CKD stage IIIa: Stable.   Essential hypertension: Stable.  Resume home medications.   Hypokalemia: Resolved.   Left great toe gout: Gave her a loading dose of colchicine yesterday, on exam gout appears to have resolved, her pain is much improved.  Prescribing her 5 more days of oral colchicine.    Discharge Diagnoses:  Principal Problem:   Sepsis ([place]) Active Problems:   Chronic kidney disease, stage 3a ([place])   Essential hypertension   Hypercholesterolemia   Type 2 diabetes mellitus ([facility])   Pelvic mass   Elevated bilirubin  RUQ pain   Hyponatremia   Thickened endometrium   Gout attack    Discharge Instructions   Allergies as of 07/26/2021   No Known Allergies      Medication List     TAKE these medications    acetaminophen 500 MG tablet Commonly known as:  TYLENOL Take 500 mg by mouth every 6 (six) hours as needed for moderate pain or headache.   colchicine 0.6 MG tablet Take 1 tablet (0.6 mg total) by mouth daily for 5 days. Start taking on: July 27, 2021   fexofenadine 180 MG tablet Commonly known as: ALLEGRA Take 180 mg by mouth daily as needed for allergies.   fluticasone 50 MCG/ACT nasal spray Commonly known as: FLONASE Place 1 spray into both nostrils daily as needed for allergies.   furosemide 40 MG tablet Commonly known as: LASIX Take 40 mg by mouth daily.   potassium chloride 10 MEQ tablet Commonly known as: KLOR-CON Take 10 mEq by mouth daily.   rosuvastatin 5 MG tablet Commonly known as: CRESTOR Take 5 mg by mouth daily.   telmisartan 80 MG tablet Commonly known as: MICARDIS Take 80 mg by mouth daily.               Durable Medical Equipment  (From admission, onward)           Start     Ordered   07/25/21 1538  For home use only DME 3 n 1  Once        07/25/21 1537            Follow-up [facility].   Specialty: Obstetrics and Gynecology Why: Hospital follow-up and endometrial sampling, they will call you with an appointment Contact information: [place] [id number] [phone]        Lavone Orn, MD Follow up in 1 week(s).   Specialty: Internal Medicine Contact information: [address] [place] [address]   [phone]                No Known Allergies  Consultations: OB/GYN, GI and general surgery   Procedures/Studies: CT CHEST W CONTRAST  Result Date: 07/21/2021 CLINICAL DATA:  Chest pain and shortness of breath. EXAM: CT CHEST WITH CONTRAST TECHNIQUE: Multidetector CT imaging of the chest was performed during intravenous contrast administration. CONTRAST:  39mL OMNIPAQUE IOHEXOL 350 MG/ML SOLN COMPARISON:  Chest radiograph 07/20/2021  FINDINGS: Cardiovascular: No significant vascular findings. Normal heart size. No pericardial effusion. Mediastinum/Nodes: No enlarged mediastinal, hilar, or axillary lymph nodes. Thyroid gland, trachea, and esophagus demonstrate no significant findings. Lungs/Pleura: Peribronchovascular consolidation with adjacent mild ground-glass opacities involves the entire lateral segment of the right middle lobe. Moderate right and trace left pleural effusions with adjacent compressive atelectasis in the lower lobes. Upper Abdomen: No acute abnormality. Musculoskeletal: No chest wall abnormality. No acute or significant osseous findings. IMPRESSION: 1. Bronchopneumonia involving the entire lateral segment of the right middle lobe. 2. Moderate right and trace left pleural effusions with adjacent compressive atelectasis in the lower lobes. Electronically Signed   By: Ileana Roup M.D.   On: 07/21/2021 15:58   MR PELVIS W WO CONTRAST  Result Date: 07/25/2021 CLINICAL DATA:  Adnexal mass on recent CT. EXAM: MRI PELVIS WITHOUT AND WITH CONTRAST TECHNIQUE: Multiplanar multisequence MR imaging of the pelvis was performed both before and after administration of intravenous contrast. CONTRAST:  42mL GADAVIST  GADOBUTROL 1 MMOL/ML IV SOLN COMPARISON:  CT on 07/17/2021 FINDINGS: Lower Urinary Tract: No urinary bladder or urethral abnormality identified. Bowel: Sigmoid diverticulosis is seen, without evidence of diverticulosis. Vascular/Lymphatic: Unremarkable. A few sub-cm left external iliac lymph nodes are seen, however no pathologically enlarged pelvic lymph nodes identified. Reproductive: -- Uterus: Measures 9.1 x 4.2 by 6.1 cm (volume = 120 cm^3). A subserosal fibroid is seen in the fundus which measures 1.6 cm in maximum diameter. Diffuse endometrial thickening is seen which measures 21 mm, and and heterogeneous contrast enhancement of the endometrium is also seen. These findings are highly suspicious for endometrial carcinoma  in a postmenopausal female. Myometrial junctional zone remains intact, with no evidence deep myometrial invasion. Cervix and vagina are unremarkable in appearance. -- Right ovary: Normal appearance. A simple cyst is seen in the right adnexa which is separate from the right ovary, measuring 5.9 x 4.0 cm on image 36/14. -- Left ovary: A solid markedly T2 hypointense mass is seen in the left adnexa which measures 8.3 x 6.7 x 7.6 cm. This abuts the left ovary and posterior lower uterine segment of the uterus. Other: Tiny amount of free fluid noted. Musculoskeletal:  Unremarkable. IMPRESSION: Diffuse endometrial thickening measuring 21 mm, with heterogeneous contrast enhancement. These findings are highly suspicious for endometrial carcinoma in a postmenopausal female. Recommend gynecology consultation, and consideration of endometrial biopsy. 8.3 cm solid mass in the left adnexa, which abuts the left ovary and posterior lower uterine segment. Differential diagnosis includes pedunculated fibroid and left ovarian fibrothecoma. 5.9 cm benign-appearing cyst in the right adnexa which is separate from the right ovary, consistent with benign paraovarian/paratubal cyst. If not surgically removed, recommend continued follow-up by ultrasound in 6 months. 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 No evidence of pelvic metastatic disease. Sigmoid diverticulosis, without evidence of diverticulosis. Electronically Signed   By: Marlaine Hind M.D.   On: 07/25/2021 08:53   NM Hepatobiliary Liver Func  Result Date: 07/18/2021 CLINICAL DATA:  Abdominal pain. EXAM: NUCLEAR MEDICINE HEPATOBILIARY IMAGING TECHNIQUE: Sequential images of the abdomen were obtained out to 60 minutes following intravenous administration of radiopharmaceutical. RADIOPHARMACEUTICALS:  5.3 mCi Tc-66m  Choletec IV COMPARISON:  CT abdomen pelvis, 07/17/2021 FINDINGS: Prompt uptake and biliary excretion of activity by the liver is seen. Gallbladder activity  is visualized, consistent with patency of cystic duct. Biliary activity passes into small bowel, consistent with patent common bile duct. IMPRESSION: 1.  The cystic and common bile ducts are patent. 2. Gallbladder ejection fraction was not assessed on this examination. Please note that gallbladder ejection fraction measurement is not reliable or validated in the acute inpatient setting and assessment for gallbladder dysmotility should generally be deferred for outpatient evaluation. Electronically Signed   By: Delanna Ahmadi M.D.   On: 07/18/2021 09:14   US PELVIS LIMITED (TRANSABDOMINAL ONLY)  Result Date: 07/17/2021 CLINICAL DATA:  Abnormal CT. EXAM: TRANSABDOMINAL ULTRASOUND OF PELVIS TECHNIQUE: Transabdominal ultrasound examination of the pelvis was performed including evaluation of the uterus, ovaries, adnexal regions, and pelvic cul-de-sac. COMPARISON:  Abdominal ultrasound and CT abdomen and pelvis 07/17/2021. FINDINGS: Uterus Measurements: 8.1 x 4.0 x 4.9 cm = volume: 82 mL. 1.8 x 0.7 x 1.9 cm calcified exophytic fibroid. Myometrium otherwise within normal limits. Endometrium Thickness: 9 mm.  Difficult to visualize.  Appears echogenic. Right ovary Not visualized. Left ovary There is a 5.7 x 6.4 x 7.2 cm mass in the left adnexa. This is slightly hypoechoic. Separate normal appearing left ovary is not  visualized. Other findings:  No abnormal free fluid. IMPRESSION: 1. Technically limited study. 2. Indeterminate left adnexal mass measuring 7.2 cm may be related to ovarian malignancy or other ovarian tumor. 3. Endometrium appears thickened and echogenic. Differential diagnosis includes endometrial hyperplasia, polyp or carcinoma. 4. Right ovary not visualized. 5. Small calcified uterine fibroid. Electronically Signed   By: Ronney Asters M.D.   On: 07/17/2021 17:23   CT ABDOMEN PELVIS W CONTRAST  Result Date: 07/17/2021 CLINICAL DATA:  Constant severe right upper quadrant abdominal pain since yesterday.  Fever. Intermittent vomiting. EXAM: CT ABDOMEN AND PELVIS WITH CONTRAST TECHNIQUE: Multidetector CT imaging of the abdomen and pelvis was performed using the standard protocol following bolus administration of intravenous contrast. CONTRAST:  58mL OMNIPAQUE IOHEXOL 300 MG/ML  SOLN COMPARISON:  None. FINDINGS: Lower chest: Dense consolidation in the posterior right middle lobe with small air bronchograms. Mild patchy and linear atelectasis in the right lower lobe. Small right pleural effusion. Mild atelectasis at the left lung base. Hepatobiliary: Borderline dilated gallbladder, measuring 10.3 cm in length. No visible gallstones. No gallbladder wall thickening or pericholecystic fluid. Two small cysts in the left lobe of the liver. Pancreas: Unremarkable. No pancreatic ductal dilatation or surrounding inflammatory changes. Spleen: Normal in size without focal abnormality. Adrenals/Urinary Tract: 2.1 cm homogeneously fat density mass in the lateral right kidney. Unremarkable left kidney, ureters, urinary bladder and adrenal glands. Stomach/Bowel: Multiple sigmoid and distal descending colon diverticula without evidence of diverticulitis. Fluid in the right colon without wall thickening. Unremarkable stomach, small bowel and appendix. Vascular/Lymphatic: Atheromatous arterial calcifications without aneurysm. No enlarged lymph nodes. Reproductive: Normal sized uterus containing a 1.4 cm anterior exophytic densely calcified fibroid. Heterogeneous low density in the endometrial cavity measuring 3.4 x 2.3 cm on image number 74/3. Cystic mass posterior to the uterus on the right which without supper identification of the right ovary. This measures 7.0 x 4.2 cm on image number 75/3. Heterogeneous solid mass posterior to the uterus on the left without separate identification of the left ovary. This mass measures 9.1 x 7.8 cm on image number 74/3. Other: Small umbilical hernia containing fat. No free peritoneal air seen.  Minimal free fluid in the left posterior para colonic gutter. Musculoskeletal: Lumbar and lower thoracic spine degenerative changes with associated grade 1 anterolisthesis at the L4-5 level. No pars defects are seen. Mild scoliosis. IMPRESSION: 1. Dense right middle lobe atelectasis or pneumonia. 2. Small right pleural effusion. 3. Mild bibasilar atelectasis. 4. 7.0 cm cystic right adnexal mass and 9.1 cm solid left adnexal mass. These could be further evaluated with elective pelvic ultrasound or MRI of the pelvis. 5. Thickened, heterogeneous endometrium, suspicious for endometrial carcinoma. 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 7. Sigmoid and distal descending colon diverticulosis. Electronically Signed   By: Claudie Revering M.D.   On: 07/17/2021 13:57   US Abdomen Limited  Result Date: 07/17/2021 CLINICAL DATA:  RIGHT upper quadrant pain. EXAM: ULTRASOUND ABDOMEN LIMITED RIGHT UPPER QUADRANT COMPARISON:  CT abdomen from earlier same day. FINDINGS: Gallbladder: Gallbladder is distended. Portions of the gallbladder are suboptimally visualized. Questionable gallbladder wall thickening at the fundus. No pericholecystic fluid. No sonographic Murphy's sign elicited during the exam, per the sonographer. Common bile duct: Diameter: 4 mm Liver: No focal lesion identified. Generalized hyperechogenicity suggesting some degree of fatty infiltration. Portal vein is patent on color Doppler imaging with normal direction of blood flow towards the liver. Other: None. IMPRESSION: 1. No convincing evidence of acute cholecystitis. No gallstones seen.  Questionable gallbladder wall thickening at the fundus. If localizable RIGHT upper quadrant pain persists or worsens, consider nuclear medicine HIDA scan for further characterization. 2. No bile duct dilatation. 3. Probable fatty infiltration of the liver. Electronically Signed   By: Franki Cabot M.D.   On: 07/17/2021 14:32   DG Chest Port 1 View  Result Date: 07/24/2021 CLINICAL  DATA:  Follow-up pneumonia. EXAM: PORTABLE CHEST 1 VIEW COMPARISON:  Chest CT 07/21/2021, portable chest 07/20/2021 FINDINGS: Dense right middle lobe consolidation, on CT seen throughout the lateral segment of the right middle lobe, is again noted and associated with layering right pleural effusion, with minimal left pleural effusion again also seen. There is stable atelectasis at the left base. Remainder of the lungs are clear. The cardiac size is normal. Calcifications are again noted in the aortic arch. IMPRESSION: Right middle lobe pneumonia and parapneumonic effusion with stable overall aeration. Electronically Signed   By: Telford Nab M.D.   On: 07/24/2021 06:01   DG CHEST PORT 1 VIEW  Result Date: 07/17/2021 CLINICAL DATA:  Right upper quadrant pain EXAM: PORTABLE CHEST 1 VIEW COMPARISON:  02/06/2005, CT 07/17/2021 FINDINGS: Consolidation in the right lung base. No pleural effusion. Cardiomediastinal silhouette within normal limits. Aortic atherosclerosis. No pneumothorax IMPRESSION: Consolidation in the right lung base is suspicious for pneumonia. Radiographic follow-up to resolution is recommended Electronically Signed   By: Donavan Foil M.D.   On: 07/17/2021 23:06   DG Chest Port 1V same Day  Result Date: 07/20/2021 CLINICAL DATA:  Shortness of breath and wheezing, chest pain EXAM: PORTABLE CHEST 1 VIEW COMPARISON:  Chest radiograph dated July 17, 2021 FINDINGS: The heart is mildly enlarged. Atherosclerotic calcification of aortic arch, unchanged. Redemonstration of right lower lobe opacity, which have slightly increased from prior examination concerning for atelectasis/pneumonia. Small right pleural effusion. No appreciable pneumothorax. IMPRESSION: 1. The right lower lobe opacity concerning for pneumonia/atelectasis with small pleural effusion, increased in size since prior examination. Follow-up examination to resolution is recommended. 2.  Stable cardiomegaly. Electronically Signed    By: Keane Police D.O.   On: 07/20/2021 10:46   DG Foot 2 Views Left  Result Date: 07/23/2021 CLINICAL DATA:  Sepsis, right foot pain EXAM: LEFT FOOT - 2 VIEW COMPARISON:  05/01/2017 FINDINGS: Normal alignment. No acute fracture or dislocation. Joint spaces are preserved. No focal erosions. Small plantar calcaneal spur. Mild diffuse soft tissue swelling of the left foot is noted, more severe involving the forefoot IMPRESSION: Soft tissue swelling.  No fracture or dislocation. Electronically Signed   By: Fidela Salisbury M.D.   On: 07/23/2021 20:34   MR ABDOMEN MRCP W WO CONTAST  Result Date: 07/20/2021 CLINICAL DATA:  Right upper quadrant abdominal pain EXAM: MRI ABDOMEN WITHOUT AND WITH CONTRAST (INCLUDING MRCP) TECHNIQUE: Multiplanar multisequence MR imaging of the abdomen was performed both before and after the administration of intravenous contrast. Heavily T2-weighted images of the biliary and pancreatic ducts were obtained, and three-dimensional MRCP images were rendered by post processing. CONTRAST:  67mL GADAVIST GADOBUTROL 1 MMOL/ML IV SOLN COMPARISON:  07/17/2021 FINDINGS: Exam detail is diminished due to motion artifact. This particularly affects the postcontrast images. Lower chest: No acute findings. Hepatobiliary: Diffuse hepatic steatosis. 1.1 cm, well-circumscribed T2 hyperintense structure within segment 2 of the left hepatic lobe is identified compatible with a simple cyst, image 28/6. The gallbladder appears moderately distended. No wall thickening. Mild pericholecystic fluid. No gallstones identified. The common bile duct has a normal caliber. No signs of choledocholithiasis.  Pancreas: There is mild pancreatic edema with peripancreatic fluid surrounding the head and neck of pancreas concerning for acute pancreatitis. No main duct dilatation or mass identified. Spleen:  Within normal limits in size and appearance. Adrenals/Urinary Tract: Normal adrenal glands. Right kidney angiomyolipoma is  identified arising off the lateral cortex of the interpolar right kidney measuring 2 cm, image 28/9. No hydronephrosis identified bilaterally. Stomach/Bowel: Visualized portions within the abdomen are unremarkable. Vascular/Lymphatic: Aortic atherosclerosis without aneurysm. Upper abdominal vascularity appears patent. No abdominal adenopathy. Other: Small volume of peripancreatic ascites identified. No focal fluid collections identified. Musculoskeletal: No suspicious bone lesions identified. IMPRESSION: 1. Mild pancreatic edema with peripancreatic fluid surrounding the head and neck of pancreas concerning for acute pancreatitis. No main duct dilatation or mass identified. 2. No gallstones or evidence of choledocholithiasis. 3. Mild hepatic steatosis. 4. Right kidney angiomyolipoma. Electronically Signed   By: Kerby Moors M.D.   On: 07/20/2021 06:30     Discharge Exam: Vitals:   07/26/21 0322 07/26/21 0707  BP: (!) 149/66 135/70  Pulse: 75 78  Resp: (!) 22 18  Temp: 98 F (36.7 C) 98.1 F (36.7 C)  SpO2: 93% 96%   Vitals:   07/25/21 2017 07/25/21 2328 07/26/21 0322 07/26/21 0707  BP:  (!) 139/50 (!) 149/66 135/70  Pulse:  70 75 78  Resp:  (!) 22 (!) 22 18  Temp:  98.2 F (36.8 C) 98 F (36.7 C) 98.1 F (36.7 C)  TempSrc:  Oral Oral Oral  SpO2: 96% 94% 93% 96%  Weight:      Height:        General: Pt is alert, awake, not in acute distress Cardiovascular: RRR, S1/S2 +, no rubs, no gallops Respiratory: CTA bilaterally, no wheezing, no rhonchi Abdominal: Soft, NT, ND, bowel sounds + Extremities: no edema, no cyanosis    The results of significant diagnostics from this hospitalization (including imaging, microbiology, ancillary and laboratory) are listed below for reference.     Microbiology: Recent Results (from the past 240 hour(s))  Blood culture (routine x 2)     Status: None   Collection Time: 07/17/21 12:30 PM   Specimen: BLOOD RIGHT ARM  Result Value Ref Range Status    Specimen Description BLOOD RIGHT ARM  Final   Special Requests   Final    BOTTLES DRAWN AEROBIC AND ANAEROBIC Blood Culture adequate volume   Culture   Final    NO GROWTH 5 DAYS Performed at [facility], [address] [place], [place] [address]    Report Status 07/22/2021 FINAL  Final  Blood culture (routine x 2)     Status: None   Collection Time: 07/17/21 12:41 PM   Specimen: BLOOD  Result Value Ref Range Status   Specimen Description BLOOD SITE NOT SPECIFIED  Final   Special Requests   Final    BOTTLES DRAWN AEROBIC AND ANAEROBIC Blood Culture adequate volume   Culture   Final    NO GROWTH 5 DAYS Performed at [facility] [address], [place], [place] [address]    Report Status 07/22/2021 FINAL  Final  Resp Panel by RT-PCR (Flu A&B, Covid) Nasopharyngeal Swab     Status: None   Collection Time: 07/17/21  3:35 PM   Specimen: Nasopharyngeal Swab; Nasopharyngeal(NP) swabs in vial transport medium  Result Value Ref Range Status   SARS Coronavirus 2 by RT PCR NEGATIVE NEGATIVE Final    Comment: (NOTE) SARS-CoV-2 target nucleic acids are NOT DETECTED.  The SARS-CoV-2 RNA is generally detectable in upper respiratory specimens during the acute phase of infection. The lowest concentration of SARS-CoV-2 viral copies this assay can detect is 138 copies/mL. A negative result does not preclude SARS-Cov-2 infection and should not be used as the sole basis for treatment or other patient management decisions. A negative result may occur with  improper specimen collection/handling, submission of specimen other than nasopharyngeal swab, presence of viral mutation(s) within the areas targeted by this assay, and inadequate number of viral copies(<138 copies/mL). A negative result must be combined with clinical observations, patient history, and epidemiological information. The expected result is Negative.  Fact Sheet for Patients:   EntrepreneurPulse.com.au  Fact Sheet for Healthcare Providers:  IncredibleEmployment.be  This test is no t yet approved or cleared by the [place] FDA and  has been authorized for detection and/or diagnosis of SARS-CoV-2 by FDA under an Emergency Use Authorization (EUA). This EUA will remain  in effect (meaning this test can be used) for the duration of the COVID-19 declaration under Section 564(b)(1) of the Act, 21 U.S.C.section 360bbb-3(b)(1), unless the authorization is terminated  or revoked sooner.       Influenza A by PCR NEGATIVE NEGATIVE Final   Influenza B by PCR NEGATIVE NEGATIVE Final    Comment: (NOTE) The Xpert Xpress SARS-CoV-2/FLU/RSV plus assay is intended as an aid in the diagnosis of influenza from Nasopharyngeal swab specimens and should not be used as a sole basis for treatment. Nasal washings and aspirates are unacceptable for Xpert Xpress SARS-CoV-2/FLU/RSV testing.  Fact Sheet for Patients: EntrepreneurPulse.com.au  Fact Sheet for Healthcare Providers: IncredibleEmployment.be  This test is not yet approved or cleared by the [place] FDA and has been authorized for detection and/or diagnosis of SARS-CoV-2 by FDA under an Emergency Use Authorization (EUA). This EUA will remain in effect (meaning this test can be used) for the duration of the COVID-19 declaration under Section 564(b)(1) of the Act, 21 U.S.C. section 360bbb-3(b)(1), unless the authorization is terminated or revoked.  Performed at [facility], [place] [address], [place], [place] [address]   Respiratory (~20 pathogens) panel by PCR     Status: None   Collection Time: 07/17/21  9:52 PM   Specimen: Nasopharyngeal Swab; Respiratory  Result Value Ref Range Status   Adenovirus NOT DETECTED NOT DETECTED Final   Coronavirus 229E NOT DETECTED NOT DETECTED Final    Comment: (NOTE) The Coronavirus on the  Respiratory Panel, DOES NOT test for the novel  Coronavirus (2019 nCoV)    Coronavirus HKU1 NOT DETECTED NOT DETECTED Final   Coronavirus NL63 NOT DETECTED NOT DETECTED Final   Coronavirus OC43 NOT DETECTED NOT DETECTED Final   Metapneumovirus NOT DETECTED NOT DETECTED Final   Rhinovirus / Enterovirus NOT DETECTED NOT DETECTED Final   Influenza A NOT DETECTED NOT DETECTED Final   Influenza B NOT DETECTED NOT DETECTED Final   Parainfluenza Virus 1 NOT DETECTED NOT DETECTED Final   Parainfluenza Virus 2 NOT DETECTED NOT DETECTED Final   Parainfluenza Virus 3 NOT DETECTED NOT DETECTED Final   Parainfluenza Virus 4 NOT DETECTED NOT DETECTED Final   Respiratory Syncytial Virus NOT DETECTED NOT DETECTED Final   Bordetella pertussis NOT DETECTED NOT DETECTED Final   Bordetella Parapertussis NOT DETECTED NOT DETECTED Final   Chlamydophila pneumoniae NOT DETECTED NOT DETECTED Final   Mycoplasma pneumoniae NOT DETECTED NOT DETECTED Final    Comment: Performed at [facility], [place], [place], [place] [address]  MRSA Next Gen by PCR, Nasal     Status: None   Collection Time: 07/17/21 11:05 PM   Specimen: Nasal Mucosa; Nasal Swab  Result Value Ref Range Status   MRSA by PCR Next Gen NOT DETECTED NOT DETECTED Final    Comment: (NOTE) The GeneXpert MRSA Assay (FDA approved for NASAL specimens only), is one component of a comprehensive MRSA colonization surveillance program. It is not intended to diagnose MRSA infection nor to guide or monitor treatment for MRSA infections. Test performance is not FDA approved in patients less than 5 years old. Performed at [facility], [place] [address], [place], [place] [address]      Labs: BNP (last 3 results) No results for input(s): BNP in the last 8760 hours. Basic Metabolic Panel: Recent Labs  Lab 07/21/21 0525 07/23/21 1317 07/24/21 0421 07/25/21 0829 07/26/21 0829  NA 137 138 137 139 139  K 4.3 3.6 3.1* 3.1* 3.6  CL 104  103 104 103 104  CO2 28 28 26 27 26   GLUCOSE 156* 163* 127* 146* 136*  BUN 10 8 7* 6* 6*  CREATININE 1.01* 0.85 0.86 0.81 0.84  CALCIUM 9.0 9.8 9.0 10.0 9.9  MG  --   --   --  2.3 2.3   Liver Function Tests: Recent Labs  Lab 07/20/21 0137  AST 34  ALT 25  ALKPHOS 50  BILITOT 1.8*  PROT 5.5*  ALBUMIN 2.3*   Recent Labs  Lab 07/20/21 0137  LIPASE 46   No results for input(s): AMMONIA in the last 168 hours. CBC: Recent Labs  Lab 07/20/21 0137 07/21/21 0525 07/23/21 1317 07/24/21 0421 07/25/21 0829  WBC 10.7* 9.9 12.5* 11.1* 10.3  NEUTROABS  --   --   --   --  8.2*  HGB 10.9* 11.6* 13.2 12.8 12.8  HCT 33.5* 34.5* 39.3 38.4 39.0  MCV 93.8 93.2 91.2 91.0 92.4  PLT 269 283 484* 440* 564*   Cardiac Enzymes: No results for input(s): CKTOTAL, CKMB, CKMBINDEX, TROPONINI in the last 168 hours. BNP: Invalid input(s): POCBNP CBG: Recent Labs  Lab 07/25/21 0754 07/25/21 1052 07/25/21 1549 07/25/21 2054 07/26/21 0714  GLUCAP 129* 136* 123* 155* 131*   D-Dimer No results for input(s): DDIMER in the last 72 hours. Hgb A1c No results for input(s): HGBA1C in the last 72 hours. Lipid Profile No results for input(s): CHOL, HDL, LDLCALC, TRIG, CHOLHDL, LDLDIRECT in the last 72 hours. Thyroid function studies No results for input(s): TSH, T4TOTAL, T3FREE, THYROIDAB in the last 72 hours.  Invalid input(s): FREET3 Anemia work up No results for input(s): VITAMINB12, FOLATE, FERRITIN, TIBC, IRON, RETICCTPCT in the last 72 hours. Urinalysis    Component Value Date/Time   COLORURINE AMBER (A) 07/17/2021 1050   APPEARANCEUR CLEAR 07/17/2021 1050   LABSPEC 1.025 07/17/2021 1050   PHURINE 5.5 07/17/2021 1050   GLUCOSEU NEGATIVE 07/17/2021 1050   HGBUR TRACE (A) 07/17/2021 [place] 07/17/2021 1050   KETONESUR NEGATIVE 07/17/2021 1050   PROTEINUR NEGATIVE 07/17/2021 1050   NITRITE NEGATIVE 07/17/2021 1050   LEUKOCYTESUR NEGATIVE 07/17/2021 1050    Sepsis Labs Invalid input(s): PROCALCITONIN,  WBC,  LACTICIDVEN Microbiology Recent Results (from the past 240 hour(s))  Blood culture (routine x 2)     Status: None   Collection Time: 07/17/21 12:30 PM   Specimen: BLOOD RIGHT ARM  Result Value Ref Range Status   Specimen Description BLOOD RIGHT ARM  Final   Special Requests  Final    BOTTLES DRAWN AEROBIC AND ANAEROBIC Blood Culture adequate volume   Culture   Final    NO GROWTH 5 DAYS Performed at [facility], [place] [address], [place], [place] [address]    Report Status 07/22/2021 FINAL  Final  Blood culture (routine x 2)     Status: None   Collection Time: 07/17/21 12:41 PM   Specimen: BLOOD  Result Value Ref Range Status   Specimen Description BLOOD SITE NOT SPECIFIED  Final   Special Requests   Final    BOTTLES DRAWN AEROBIC AND ANAEROBIC Blood Culture adequate volume   Culture   Final    NO GROWTH 5 DAYS Performed at [facility] [address], [place], [place] [address]    Report Status 07/22/2021 FINAL  Final  Resp Panel by RT-PCR (Flu A&B, Covid) Nasopharyngeal Swab     Status: None   Collection Time: 07/17/21  3:35 PM   Specimen: Nasopharyngeal Swab; Nasopharyngeal(NP) swabs in vial transport medium  Result Value Ref Range Status   SARS Coronavirus 2 by RT PCR NEGATIVE NEGATIVE Final    Comment: (NOTE) SARS-CoV-2 target nucleic acids are NOT DETECTED.  The SARS-CoV-2 RNA is generally detectable in upper respiratory specimens during the acute phase of infection. The lowest concentration of SARS-CoV-2 viral copies this assay can detect is 138 copies/mL. A negative result does not preclude SARS-Cov-2 infection and should not be used as the sole basis for treatment or other patient management decisions. A negative result may occur with  improper specimen collection/handling, submission of specimen other than nasopharyngeal swab, presence of viral mutation(s) within the areas targeted by this assay,  and inadequate number of viral copies(<138 copies/mL). A negative result must be combined with clinical observations, patient history, and epidemiological information. The expected result is Negative.  Fact Sheet for Patients:  EntrepreneurPulse.com.au  Fact Sheet for Healthcare Providers:  IncredibleEmployment.be  This test is no t yet approved or cleared by the [place] FDA and  has been authorized for detection and/or diagnosis of SARS-CoV-2 by FDA under an Emergency Use Authorization (EUA). This EUA will remain  in effect (meaning this test can be used) for the duration of the COVID-19 declaration under Section 564(b)(1) of the Act, 21 U.S.C.section 360bbb-3(b)(1), unless the authorization is terminated  or revoked sooner.       Influenza A by PCR NEGATIVE NEGATIVE Final   Influenza B by PCR NEGATIVE NEGATIVE Final    Comment: (NOTE) The Xpert Xpress SARS-CoV-2/FLU/RSV plus assay is intended as an aid in the diagnosis of influenza from Nasopharyngeal swab specimens and should not be used as a sole basis for treatment. Nasal washings and aspirates are unacceptable for Xpert Xpress SARS-CoV-2/FLU/RSV testing.  Fact Sheet for Patients: EntrepreneurPulse.com.au  Fact Sheet for Healthcare Providers: IncredibleEmployment.be  This test is not yet approved or cleared by the [place] FDA and has been authorized for detection and/or diagnosis of SARS-CoV-2 by FDA under an Emergency Use Authorization (EUA). This EUA will remain in effect (meaning this test can be used) for the duration of the COVID-19 declaration under Section 564(b)(1) of the Act, 21 U.S.C. section 360bbb-3(b)(1), unless the authorization is terminated or revoked.  Performed at [facility], [place] [address] [place], [place] [address]   Respiratory (~20 pathogens) panel by PCR     Status: None   Collection Time: 07/17/21   9:52 PM   Specimen: Nasopharyngeal Swab; Respiratory  Result Value Ref Range  Status   Adenovirus NOT DETECTED NOT DETECTED Final   Coronavirus 229E NOT DETECTED NOT DETECTED Final    Comment: (NOTE) The Coronavirus on the Respiratory Panel, DOES NOT test for the novel  Coronavirus (2019 nCoV)    Coronavirus HKU1 NOT DETECTED NOT DETECTED Final   Coronavirus NL63 NOT DETECTED NOT DETECTED Final   Coronavirus OC43 NOT DETECTED NOT DETECTED Final   Metapneumovirus NOT DETECTED NOT DETECTED Final   Rhinovirus / Enterovirus NOT DETECTED NOT DETECTED Final   Influenza A NOT DETECTED NOT DETECTED Final   Influenza B NOT DETECTED NOT DETECTED Final   Parainfluenza Virus 1 NOT DETECTED NOT DETECTED Final   Parainfluenza Virus 2 NOT DETECTED NOT DETECTED Final   Parainfluenza Virus 3 NOT DETECTED NOT DETECTED Final   Parainfluenza Virus 4 NOT DETECTED NOT DETECTED Final   Respiratory Syncytial Virus NOT DETECTED NOT DETECTED Final   Bordetella pertussis NOT DETECTED NOT DETECTED Final   Bordetella Parapertussis NOT DETECTED NOT DETECTED Final   Chlamydophila pneumoniae NOT DETECTED NOT DETECTED Final   Mycoplasma pneumoniae NOT DETECTED NOT DETECTED Final    Comment: Performed at [facility], [place] [address] [place], [place] [address]  MRSA Next Gen by PCR, Nasal     Status: None   Collection Time: 07/17/21 11:05 PM   Specimen: Nasal Mucosa; Nasal Swab  Result Value Ref Range Status   MRSA by PCR Next Gen NOT DETECTED NOT DETECTED Final    Comment: (NOTE) The GeneXpert MRSA Assay (FDA approved for NASAL specimens only), is one component of a comprehensive MRSA colonization surveillance program. It is not intended to diagnose MRSA infection nor to guide or monitor treatment for MRSA infections. Test performance is not FDA approved in patients less than 33 years old. Performed at [facility], [place] [address], [place], [place] [address]      Time coordinating discharge:  Over 30 minutes  SIGNED:   Darliss Cheney, MD  [facility] 07/26/2021, 10:30 AM  If 7PM-7AM, please contact night-coverage [email or web address]

## 2021-07-26 NOTE — Progress Notes (Signed)
Physical Therapy Treatment Patient Details Name: Jade Sanchez MRN: [id number] DOB: 1940/08/30 Today's Date: 07/26/2021   History of Present Illness 80 y/o female presented to ED on 11/28 for RUQ pain and fever. CT abdomen/pelvis showed R middle lobe atelectasis vs pneumonia with small R pleural effusion. Bilateral adnexal cystic masses, thickened endometrial lining  CT finding concerning for malignancy. Found to be in sepsis in setting of possible pneumonia vs atelectasis in R middle lobe. PMH: HTN, DM    PT Comments    Pt states she is feeling well, nervous about possible d/c home today and will need to organize family support. Pt ambulatory in hallway with rollator and supervision level of assist, per pt her family plans to alternate assisting her at home to provide needed supervision. Pt struggles with stair navigation, but able to perform with light PT assist and use of rail, discussed the need of +2 help to ascend steps at home since she has no railing. Pt progressing well, maintaining SPO2 >90% on RA during gait. Plan for d/c remains appropriate.    Recommendations for follow up therapy are one component of a multi-disciplinary discharge planning process, led by the attending physician.  Recommendations may be updated based on patient status, additional functional criteria and insurance authorization.  Follow Up Recommendations  Home health PT     Assistance Recommended at Discharge Intermittent Supervision/Assistance  Equipment Recommendations  BSC/3in1 (has rollator at home)    Recommendations for Other Services       Precautions / Restrictions Precautions Precautions: Fall Restrictions Weight Bearing Restrictions: No     Mobility  Bed Mobility Overal bed mobility: Needs Assistance             General bed mobility comments: OOB on toilet    Transfers Overall transfer level: Needs assistance Equipment used: Rolling walker (2 wheels) Transfers: Sit  to/from Stand Sit to Stand: Supervision           General transfer comment: safety, from toilet.    Ambulation/Gait Ambulation/Gait assistance: Supervision Gait Distance (Feet): 200 Feet Assistive device: Rollator (4 wheels) Gait Pattern/deviations: Knee flexed in stance - left;Trunk flexed Gait velocity: decr     General Gait Details: for safety, cues for rollator use and rest breaks as needed.   Stairs Stairs: Yes Stairs assistance: Min assist Stair Management: One rail Right;Step to pattern;Forwards Number of Stairs: 2 General stair comments: assist for boost and steadying, has to use handrail and HHA of PT to perform. Does not have a rail at home, states she can have two family members help her inside   Wheelchair Mobility    Modified Rankin (Stroke Patients Only)       Balance Overall balance assessment: Needs assistance Sitting-balance support: Bilateral upper extremity supported;Feet supported Sitting balance-Leahy Scale: Good     Standing balance support: Bilateral upper extremity supported;Reliant on assistive device for balance Standing balance-Leahy Scale: Fair Standing balance comment: able to maintain static standing without UE support                            Cognition Arousal/Alertness: Awake/alert Behavior During Therapy: WFL for tasks assessed/performed Overall Cognitive Status: Within Functional Limits for tasks assessed                                          Exercises  General Comments General comments (skin integrity, edema, etc.): SpO2 97% on RA, DOE 2/4 during gait      Pertinent Vitals/Pain Pain Assessment: Faces Faces Pain Scale: No hurt Pain Intervention(s): Monitored during session    Home Living                          Prior Function            PT Goals (current goals can now be found in the care plan section) Acute Rehab PT Goals Patient Stated Goal: to get stronger  and be independent PT Goal Formulation: With patient Time For Goal Achievement: 08/05/21 Potential to Achieve Goals: Good Progress towards PT goals: Progressing toward goals    Frequency    Min 3X/week      PT Plan Current plan remains appropriate    Co-evaluation              AM-PAC PT "6 Clicks" Mobility   Outcome Measure  Help needed turning from your back to your side while in a flat bed without using bedrails?: A Little Help needed moving from lying on your back to sitting on the side of a flat bed without using bedrails?: A Little Help needed moving to and from a bed to a chair (including a wheelchair)?: A Little Help needed standing up from a chair using your arms (e.g., wheelchair or bedside chair)?: A Little Help needed to walk in hospital room?: A Little Help needed climbing 3-5 steps with a railing? : A Little 6 Click Score: 18    End of Session   Activity Tolerance: Patient tolerated treatment well Patient left: in chair;with call bell/phone within reach;Other (comment) (pt presses call button for assist to mobilize) Nurse Communication: Mobility status PT Visit Diagnosis: Unsteadiness on feet (R26.81);Muscle weakness (generalized) (M62.81);Other abnormalities of gait and mobility (R26.89)     Time: 6295-2841 PT Time Calculation (min) (ACUTE ONLY): 20 min  Charges:  $Gait Training: 8-22 mins                     Stacie Glaze, PT DPT Acute Rehabilitation Services Pager [phone]  Office [phone]   [place] 07/26/2021, 10:01 AM

## 2021-07-27 DIAGNOSIS — M109 Gout, unspecified: Secondary | ICD-10-CM

## 2021-07-27 HISTORY — DX: Gout, unspecified: M10.9

## 2021-07-29 DIAGNOSIS — E7800 Pure hypercholesterolemia, unspecified: Secondary | ICD-10-CM | POA: Diagnosis not present

## 2021-07-29 DIAGNOSIS — N838 Other noninflammatory disorders of ovary, fallopian tube and broad ligament: Secondary | ICD-10-CM | POA: Diagnosis not present

## 2021-07-29 DIAGNOSIS — N1831 Chronic kidney disease, stage 3a: Secondary | ICD-10-CM | POA: Diagnosis not present

## 2021-07-29 DIAGNOSIS — I129 Hypertensive chronic kidney disease with stage 1 through stage 4 chronic kidney disease, or unspecified chronic kidney disease: Secondary | ICD-10-CM | POA: Diagnosis not present

## 2021-07-29 DIAGNOSIS — Z7951 Long term (current) use of inhaled steroids: Secondary | ICD-10-CM | POA: Diagnosis not present

## 2021-07-29 DIAGNOSIS — K760 Fatty (change of) liver, not elsewhere classified: Secondary | ICD-10-CM | POA: Diagnosis not present

## 2021-07-29 DIAGNOSIS — K5730 Diverticulosis of large intestine without perforation or abscess without bleeding: Secondary | ICD-10-CM | POA: Diagnosis not present

## 2021-07-29 DIAGNOSIS — K219 Gastro-esophageal reflux disease without esophagitis: Secondary | ICD-10-CM | POA: Diagnosis not present

## 2021-07-29 DIAGNOSIS — M109 Gout, unspecified: Secondary | ICD-10-CM | POA: Diagnosis not present

## 2021-07-29 DIAGNOSIS — Z8701 Personal history of pneumonia (recurrent): Secondary | ICD-10-CM | POA: Diagnosis not present

## 2021-07-29 DIAGNOSIS — E210 Primary hyperparathyroidism: Secondary | ICD-10-CM | POA: Diagnosis not present

## 2021-07-29 DIAGNOSIS — E1122 Type 2 diabetes mellitus with diabetic chronic kidney disease: Secondary | ICD-10-CM | POA: Diagnosis not present

## 2021-07-29 DIAGNOSIS — R1900 Intra-abdominal and pelvic swelling, mass and lump, unspecified site: Secondary | ICD-10-CM | POA: Diagnosis not present

## 2021-07-29 DIAGNOSIS — M810 Age-related osteoporosis without current pathological fracture: Secondary | ICD-10-CM | POA: Diagnosis not present

## 2021-07-31 ENCOUNTER — Encounter: Payer: Self-pay | Admitting: Obstetrics and Gynecology

## 2021-07-31 DIAGNOSIS — M109 Gout, unspecified: Secondary | ICD-10-CM | POA: Diagnosis not present

## 2021-07-31 DIAGNOSIS — E1122 Type 2 diabetes mellitus with diabetic chronic kidney disease: Secondary | ICD-10-CM | POA: Diagnosis not present

## 2021-07-31 DIAGNOSIS — N1831 Chronic kidney disease, stage 3a: Secondary | ICD-10-CM | POA: Diagnosis not present

## 2021-07-31 DIAGNOSIS — N838 Other noninflammatory disorders of ovary, fallopian tube and broad ligament: Secondary | ICD-10-CM | POA: Diagnosis not present

## 2021-07-31 DIAGNOSIS — R1900 Intra-abdominal and pelvic swelling, mass and lump, unspecified site: Secondary | ICD-10-CM | POA: Diagnosis not present

## 2021-07-31 DIAGNOSIS — I129 Hypertensive chronic kidney disease with stage 1 through stage 4 chronic kidney disease, or unspecified chronic kidney disease: Secondary | ICD-10-CM | POA: Diagnosis not present

## 2021-07-31 DIAGNOSIS — N83202 Unspecified ovarian cyst, left side: Secondary | ICD-10-CM | POA: Insufficient documentation

## 2021-08-01 DIAGNOSIS — N1831 Chronic kidney disease, stage 3a: Secondary | ICD-10-CM | POA: Diagnosis not present

## 2021-08-01 DIAGNOSIS — M109 Gout, unspecified: Secondary | ICD-10-CM | POA: Diagnosis not present

## 2021-08-01 DIAGNOSIS — I129 Hypertensive chronic kidney disease with stage 1 through stage 4 chronic kidney disease, or unspecified chronic kidney disease: Secondary | ICD-10-CM | POA: Diagnosis not present

## 2021-08-01 DIAGNOSIS — R1900 Intra-abdominal and pelvic swelling, mass and lump, unspecified site: Secondary | ICD-10-CM | POA: Diagnosis not present

## 2021-08-01 DIAGNOSIS — E1122 Type 2 diabetes mellitus with diabetic chronic kidney disease: Secondary | ICD-10-CM | POA: Diagnosis not present

## 2021-08-01 DIAGNOSIS — N838 Other noninflammatory disorders of ovary, fallopian tube and broad ligament: Secondary | ICD-10-CM | POA: Diagnosis not present

## 2021-08-02 ENCOUNTER — Other Ambulatory Visit (HOSPITAL_COMMUNITY)
Admission: RE | Admit: 2021-08-02 | Discharge: 2021-08-02 | Disposition: A | Discharge status: Home / Self Care | Payer: Medicare Other | Source: Ambulatory Visit | Attending: Obstetrics and Gynecology | Admitting: Obstetrics and Gynecology

## 2021-08-02 ENCOUNTER — Ambulatory Visit (INDEPENDENT_AMBULATORY_CARE_PROVIDER_SITE_OTHER): Payer: Medicare Other | Admitting: Obstetrics and Gynecology

## 2021-08-02 ENCOUNTER — Other Ambulatory Visit: Payer: Self-pay

## 2021-08-02 ENCOUNTER — Encounter: Payer: Self-pay | Admitting: Obstetrics and Gynecology

## 2021-08-02 VITALS — BP 164/75 | HR 70 | Ht 67.0 in | Wt 241.3 lb

## 2021-08-02 DIAGNOSIS — N939 Abnormal uterine and vaginal bleeding, unspecified: Secondary | ICD-10-CM | POA: Diagnosis not present

## 2021-08-02 DIAGNOSIS — Z8619 Personal history of other infectious and parasitic diseases: Secondary | ICD-10-CM

## 2021-08-02 DIAGNOSIS — R9389 Abnormal findings on diagnostic imaging of other specified body structures: Secondary | ICD-10-CM

## 2021-08-02 DIAGNOSIS — N9089 Other specified noninflammatory disorders of vulva and perineum: Secondary | ICD-10-CM

## 2021-08-02 DIAGNOSIS — N840 Polyp of corpus uteri: Secondary | ICD-10-CM | POA: Diagnosis not present

## 2021-08-02 DIAGNOSIS — R978 Other abnormal tumor markers: Secondary | ICD-10-CM | POA: Diagnosis not present

## 2021-08-02 DIAGNOSIS — Z09 Encounter for follow-up examination after completed treatment for conditions other than malignant neoplasm: Secondary | ICD-10-CM | POA: Diagnosis not present

## 2021-08-02 DIAGNOSIS — R1900 Intra-abdominal and pelvic swelling, mass and lump, unspecified site: Secondary | ICD-10-CM | POA: Diagnosis not present

## 2021-08-02 DIAGNOSIS — N83202 Unspecified ovarian cyst, left side: Secondary | ICD-10-CM

## 2021-08-02 DIAGNOSIS — N83201 Unspecified ovarian cyst, right side: Secondary | ICD-10-CM

## 2021-08-02 HISTORY — PX: ENDOMETRIAL BIOPSY: PRO73

## 2021-08-02 MED ORDER — MICONAZOLE NITRATE 2 % VA CREA: 1.0000 | TOPICAL_CREAM | Freq: Every day | VAGINAL | 2 refills | Status: DC

## 2021-08-02 NOTE — Progress Notes (Signed)
Obstetrics and Gynecology Visit Return Patient Evaluation  Appointment Date: 08/02/2021  Primary Care Provider: [place], [place] for [facility]  Chief Complaint: inpatient consult follow up  History of Present Illness:  Jade Sanchez is a 80 y.o. P2 (LMP mid to late 75s) with above CC. Patient inpatient 11/28-12/7 on the Hospitalist service, ruq pain, fevers, GI s/s, sepsis.  During her work up, thickened endometrial stripe and b/l ovarian masses seen. GYN consulted and recommended outpatient follow up  Patient denies any h/o abnormal paps and states her PCP stopped them at age 42. No h/o HRT with peri-menopause and no PMB or spotting since menopause.    Review of Systems: as noted in the History of Present Illness.   Patient Active Problem List   Diagnosis Date Noted   Ovarian cyst, bilateral 07/31/2021   Gout attack 07/26/2021   Thickened endometrium    Chronic kidney disease, stage 3a ([place]) 07/17/2021   Essential hypertension 07/17/2021   Gastro-esophageal reflux disease without esophagitis 07/17/2021   Hypercholesterolemia 07/17/2021   Primary hyperparathyroidism ([place]) 07/17/2021   Type 2 diabetes mellitus ([place]) 07/17/2021   Sepsis ([place]) 07/17/2021   Pelvic mass 07/17/2021   Elevated bilirubin 07/17/2021   RUQ pain 07/17/2021   Hyponatremia 07/17/2021   Medications:  Mardene Celeste P. Couillard "Fraser Din" had no medications administered during this visit. Current Outpatient Medications  Medication Sig Dispense Refill   acetaminophen (TYLENOL) 500 MG tablet Take 500 mg by mouth every 6 (six) hours as needed for moderate pain or headache.     fexofenadine (ALLEGRA) 180 MG tablet Take 180 mg by mouth daily as needed for allergies.     fluticasone (FLONASE) 50 MCG/ACT nasal spray Place 1 spray into both nostrils daily as needed for allergies.     furosemide (LASIX) 40 MG tablet Take 40 mg by mouth daily.     miconazole  (MONISTAT 7) 2 % vaginal cream Place 1 Applicatorful vaginally at bedtime. Apply for seven nights 30 g 2   potassium chloride (KLOR-CON) 10 MEQ tablet Take 10 mEq by mouth daily.     rosuvastatin (CRESTOR) 5 MG tablet Take 5 mg by mouth daily.     telmisartan (MICARDIS) 80 MG tablet Take 80 mg by mouth daily.     colchicine 0.6 MG tablet Take 1 tablet (0.6 mg total) by mouth daily for 5 days. 5 tablet 0   No current facility-administered medications for this visit.    Allergies: has No Known Allergies.  Physical Exam:  BP (!) 164/75    Pulse 70    Ht 5\' 7"  (1.702 m)    Wt 241 lb 4.8 oz (109.5 kg)    BMI 37.79 kg/m  Body mass index is 37.79 kg/m. General appearance: Well nourished, well developed female in no acute distress.  Abdomen: diffusely non tender to palpation, non distended, and no masses, hernias Neuro/Psych:  Normal mood and affect.    Pelvic exam:  EGBUS: mild erythema b/l on the labia and inner thigh to near the rectum, moderate atrophy  Vaginal vault: moderate atrophy, no vaginal bleeding Cervix:  normal Bimanual: negative  See procedure note for embx   Assessment: pt stable  Plan:  1. Ovarian cyst, bilateral - CEA - Cancer antigen 19-9 - Cytology - PAP( [place]) - Surgical pathology( [place]/ [facility]) - Ambulatory referral to Gynecologic Oncology  2. Thickened endometrium Long d/w patient and support person that I am concerned about endometrial cancer based on  embx today but will have to wait for final lab results and then her consult with Gyn Onc. Referral for Gyn Onc placed today.  Patient has routine PCP follow on Friday she states.  - CEA - Cancer antigen 19-9 - Cytology - PAP( [place]) - Surgical pathology( [place]/ [facility])  3. Vulvar irritation Patient states she's had some due to abx she was on with the diarrhea. Will sent in monistat for this   RTC: PRN  Durene Romans MD Attending Center for [facility]  [facility])

## 2021-08-02 NOTE — Procedures (Signed)
Endometrial Biopsy Procedure Note  Pre-operative Diagnosis: Bilateral pelvic masses. Thickened endometrium on MRI  Post-operative Diagnosis: same  Procedure Details   The risks (including infection, bleeding, pain, and uterine perforation) and benefits of the procedure were explained to the patient and Written informed consent was obtained.  The patient was placed in the dorsal lithotomy position.  Bimanual exam showed the uterus to be in the neutral position.  A Pederson speculum inserted in the vagina, and the cervix was visualized and a pap smear performed. The cervix was then prepped with povidone iodine, and a sharp tenaculum was applied to the anterior lip of the cervix for stabilization.  A pipelle was inserted into the uterine cavity and sounded the uterus to a depth of 8.5cm.  A small amount of tissue but a large amount of clear to sero/sang mucinous like discharge was removed from the uterus over two passes. The sample was sent for pathologic examination.  Condition: Stable  Complications: None  Plan: The patient was advised to call for any fever or for prolonged or severe pain or bleeding. She was advised to use OTC analgesics as needed for mild to moderate pain.   Durene Romans MD Attending Center for [employer])

## 2021-08-03 DIAGNOSIS — R1900 Intra-abdominal and pelvic swelling, mass and lump, unspecified site: Secondary | ICD-10-CM | POA: Diagnosis not present

## 2021-08-03 DIAGNOSIS — M109 Gout, unspecified: Secondary | ICD-10-CM | POA: Diagnosis not present

## 2021-08-03 DIAGNOSIS — E1122 Type 2 diabetes mellitus with diabetic chronic kidney disease: Secondary | ICD-10-CM | POA: Diagnosis not present

## 2021-08-03 DIAGNOSIS — N838 Other noninflammatory disorders of ovary, fallopian tube and broad ligament: Secondary | ICD-10-CM | POA: Diagnosis not present

## 2021-08-03 DIAGNOSIS — N1831 Chronic kidney disease, stage 3a: Secondary | ICD-10-CM | POA: Diagnosis not present

## 2021-08-03 DIAGNOSIS — I129 Hypertensive chronic kidney disease with stage 1 through stage 4 chronic kidney disease, or unspecified chronic kidney disease: Secondary | ICD-10-CM | POA: Diagnosis not present

## 2021-08-03 LAB — CANCER ANTIGEN 19-9: CA 19-9: 26 U/mL (ref 0–35)

## 2021-08-03 LAB — CEA: CEA: 4.9 ng/mL — ABNORMAL HIGH (ref 0.0–4.7)

## 2021-08-04 ENCOUNTER — Telehealth: Payer: Self-pay | Admitting: *Deleted

## 2021-08-04 DIAGNOSIS — R1900 Intra-abdominal and pelvic swelling, mass and lump, unspecified site: Secondary | ICD-10-CM | POA: Diagnosis not present

## 2021-08-04 DIAGNOSIS — N1831 Chronic kidney disease, stage 3a: Secondary | ICD-10-CM | POA: Diagnosis not present

## 2021-08-04 DIAGNOSIS — Z8701 Personal history of pneumonia (recurrent): Secondary | ICD-10-CM | POA: Diagnosis not present

## 2021-08-04 DIAGNOSIS — N838 Other noninflammatory disorders of ovary, fallopian tube and broad ligament: Secondary | ICD-10-CM | POA: Diagnosis not present

## 2021-08-04 DIAGNOSIS — E1122 Type 2 diabetes mellitus with diabetic chronic kidney disease: Secondary | ICD-10-CM | POA: Diagnosis not present

## 2021-08-04 DIAGNOSIS — R1011 Right upper quadrant pain: Secondary | ICD-10-CM | POA: Diagnosis not present

## 2021-08-04 DIAGNOSIS — M109 Gout, unspecified: Secondary | ICD-10-CM | POA: Diagnosis not present

## 2021-08-04 DIAGNOSIS — I129 Hypertensive chronic kidney disease with stage 1 through stage 4 chronic kidney disease, or unspecified chronic kidney disease: Secondary | ICD-10-CM | POA: Diagnosis not present

## 2021-08-04 LAB — CYTOLOGY - PAP
Chlamydia: NEGATIVE
Comment: NEGATIVE
Comment: NEGATIVE
Comment: NEGATIVE
Comment: NORMAL
Diagnosis: NEGATIVE
Diagnosis: REACTIVE
High risk HPV: NEGATIVE
Neisseria Gonorrhea: NEGATIVE
Trichomonas: NEGATIVE

## 2021-08-04 LAB — SURGICAL PATHOLOGY

## 2021-08-04 NOTE — Telephone Encounter (Signed)
Spoke with the patient and scheduled a new patient appt with Dr Berline Lopes for 12/28 at 9 am; patient given the arrival time of 8:30 am. Patient given the address and phone number for the clinic; along with the policy for masks and visitors

## 2021-08-07 ENCOUNTER — Telehealth: Payer: Self-pay | Admitting: Obstetrics and Gynecology

## 2021-08-07 DIAGNOSIS — R1900 Intra-abdominal and pelvic swelling, mass and lump, unspecified site: Secondary | ICD-10-CM | POA: Diagnosis not present

## 2021-08-07 DIAGNOSIS — E1122 Type 2 diabetes mellitus with diabetic chronic kidney disease: Secondary | ICD-10-CM | POA: Diagnosis not present

## 2021-08-07 DIAGNOSIS — M109 Gout, unspecified: Secondary | ICD-10-CM | POA: Diagnosis not present

## 2021-08-07 DIAGNOSIS — N838 Other noninflammatory disorders of ovary, fallopian tube and broad ligament: Secondary | ICD-10-CM | POA: Diagnosis not present

## 2021-08-07 DIAGNOSIS — I129 Hypertensive chronic kidney disease with stage 1 through stage 4 chronic kidney disease, or unspecified chronic kidney disease: Secondary | ICD-10-CM | POA: Diagnosis not present

## 2021-08-07 DIAGNOSIS — N1831 Chronic kidney disease, stage 3a: Secondary | ICD-10-CM | POA: Diagnosis not present

## 2021-08-07 NOTE — Telephone Encounter (Signed)
Patient called at [phone] and lab results d/w pt and no overly concerning results which is reassuring but next and final steps will be d/w her on 12/28 gyn onc appt which the pt is already of aware of. All questions asked and answered  Durene Romans MD Attending Center for [employer] (Faculty Practice) 08/07/2021 Time: [phone]

## 2021-08-08 DIAGNOSIS — I129 Hypertensive chronic kidney disease with stage 1 through stage 4 chronic kidney disease, or unspecified chronic kidney disease: Secondary | ICD-10-CM | POA: Diagnosis not present

## 2021-08-08 DIAGNOSIS — E1122 Type 2 diabetes mellitus with diabetic chronic kidney disease: Secondary | ICD-10-CM | POA: Diagnosis not present

## 2021-08-08 DIAGNOSIS — N838 Other noninflammatory disorders of ovary, fallopian tube and broad ligament: Secondary | ICD-10-CM | POA: Diagnosis not present

## 2021-08-08 DIAGNOSIS — M109 Gout, unspecified: Secondary | ICD-10-CM | POA: Diagnosis not present

## 2021-08-08 DIAGNOSIS — N1831 Chronic kidney disease, stage 3a: Secondary | ICD-10-CM | POA: Diagnosis not present

## 2021-08-08 DIAGNOSIS — R1900 Intra-abdominal and pelvic swelling, mass and lump, unspecified site: Secondary | ICD-10-CM | POA: Diagnosis not present

## 2021-08-10 DIAGNOSIS — I129 Hypertensive chronic kidney disease with stage 1 through stage 4 chronic kidney disease, or unspecified chronic kidney disease: Secondary | ICD-10-CM | POA: Diagnosis not present

## 2021-08-10 DIAGNOSIS — E1122 Type 2 diabetes mellitus with diabetic chronic kidney disease: Secondary | ICD-10-CM | POA: Diagnosis not present

## 2021-08-10 DIAGNOSIS — N1831 Chronic kidney disease, stage 3a: Secondary | ICD-10-CM | POA: Diagnosis not present

## 2021-08-10 DIAGNOSIS — M109 Gout, unspecified: Secondary | ICD-10-CM | POA: Diagnosis not present

## 2021-08-10 DIAGNOSIS — R1900 Intra-abdominal and pelvic swelling, mass and lump, unspecified site: Secondary | ICD-10-CM | POA: Diagnosis not present

## 2021-08-10 DIAGNOSIS — N838 Other noninflammatory disorders of ovary, fallopian tube and broad ligament: Secondary | ICD-10-CM | POA: Diagnosis not present

## 2021-08-11 DIAGNOSIS — R1900 Intra-abdominal and pelvic swelling, mass and lump, unspecified site: Secondary | ICD-10-CM | POA: Diagnosis not present

## 2021-08-11 DIAGNOSIS — M109 Gout, unspecified: Secondary | ICD-10-CM | POA: Diagnosis not present

## 2021-08-11 DIAGNOSIS — N1831 Chronic kidney disease, stage 3a: Secondary | ICD-10-CM | POA: Diagnosis not present

## 2021-08-11 DIAGNOSIS — E1122 Type 2 diabetes mellitus with diabetic chronic kidney disease: Secondary | ICD-10-CM | POA: Diagnosis not present

## 2021-08-11 DIAGNOSIS — I129 Hypertensive chronic kidney disease with stage 1 through stage 4 chronic kidney disease, or unspecified chronic kidney disease: Secondary | ICD-10-CM | POA: Diagnosis not present

## 2021-08-11 DIAGNOSIS — N838 Other noninflammatory disorders of ovary, fallopian tube and broad ligament: Secondary | ICD-10-CM | POA: Diagnosis not present

## 2021-08-15 ENCOUNTER — Encounter: Payer: Self-pay | Admitting: Gynecologic Oncology

## 2021-08-15 DIAGNOSIS — R1900 Intra-abdominal and pelvic swelling, mass and lump, unspecified site: Secondary | ICD-10-CM | POA: Diagnosis not present

## 2021-08-15 DIAGNOSIS — N1831 Chronic kidney disease, stage 3a: Secondary | ICD-10-CM | POA: Diagnosis not present

## 2021-08-15 DIAGNOSIS — E1122 Type 2 diabetes mellitus with diabetic chronic kidney disease: Secondary | ICD-10-CM | POA: Diagnosis not present

## 2021-08-15 DIAGNOSIS — I129 Hypertensive chronic kidney disease with stage 1 through stage 4 chronic kidney disease, or unspecified chronic kidney disease: Secondary | ICD-10-CM | POA: Diagnosis not present

## 2021-08-15 DIAGNOSIS — M109 Gout, unspecified: Secondary | ICD-10-CM | POA: Diagnosis not present

## 2021-08-15 DIAGNOSIS — N838 Other noninflammatory disorders of ovary, fallopian tube and broad ligament: Secondary | ICD-10-CM | POA: Diagnosis not present

## 2021-08-16 ENCOUNTER — Encounter: Payer: Self-pay | Admitting: Gynecologic Oncology

## 2021-08-16 ENCOUNTER — Inpatient Hospital Stay: Payer: Medicare Other | Attending: Gynecologic Oncology | Admitting: Gynecologic Oncology

## 2021-08-16 ENCOUNTER — Inpatient Hospital Stay (HOSPITAL_BASED_OUTPATIENT_CLINIC_OR_DEPARTMENT_OTHER): Payer: Medicare Other | Admitting: Gynecologic Oncology

## 2021-08-16 ENCOUNTER — Other Ambulatory Visit: Payer: Self-pay

## 2021-08-16 VITALS — BP 151/57 | HR 77 | Temp 97.6°F | Resp 16 | Ht 67.0 in | Wt 236.8 lb

## 2021-08-16 DIAGNOSIS — R9389 Abnormal findings on diagnostic imaging of other specified body structures: Secondary | ICD-10-CM

## 2021-08-16 DIAGNOSIS — Z780 Asymptomatic menopausal state: Secondary | ICD-10-CM | POA: Diagnosis not present

## 2021-08-16 DIAGNOSIS — M1990 Unspecified osteoarthritis, unspecified site: Secondary | ICD-10-CM | POA: Insufficient documentation

## 2021-08-16 DIAGNOSIS — N83201 Unspecified ovarian cyst, right side: Secondary | ICD-10-CM | POA: Diagnosis not present

## 2021-08-16 DIAGNOSIS — Z79899 Other long term (current) drug therapy: Secondary | ICD-10-CM | POA: Insufficient documentation

## 2021-08-16 DIAGNOSIS — N83202 Unspecified ovarian cyst, left side: Secondary | ICD-10-CM

## 2021-08-16 DIAGNOSIS — E119 Type 2 diabetes mellitus without complications: Secondary | ICD-10-CM | POA: Insufficient documentation

## 2021-08-16 DIAGNOSIS — I10 Essential (primary) hypertension: Secondary | ICD-10-CM | POA: Insufficient documentation

## 2021-08-16 DIAGNOSIS — Z800 Family history of malignant neoplasm of digestive organs: Secondary | ICD-10-CM | POA: Diagnosis not present

## 2021-08-16 NOTE — Patient Instructions (Signed)
Preparing for your Surgery   Plan for surgery on August 29, 2021 with Dr. Jeral Pinch at [facility]. You will be scheduled for a dilation and curettage of the uterus with hysteroscopy and myosure.    Pre-operative Testing -You will receive a phone call from presurgical testing at [facility] to discuss surgery instructions and arrange for lab work if needed.   -Bring your insurance card, copy of an advanced directive if applicable, medication list.   -You should not be taking blood thinners or aspirin at least ten days prior to surgery unless instructed by your surgeon.   -Do not take supplements such as fish oil (omega 3), red yeast rice, turmeric before your surgery. You want to avoid medications with aspirin in them including headache powders such as BC or Goody's), Excedrin migraine.   Day Before Surgery at [facility] will be advised you can have clear liquids up until 3 hours before your surgery.     Your role in recovery Your role is to become active as soon as directed by your doctor, while still giving yourself time to heal.  Rest when you feel tired. You will be asked to do the following in order to speed your recovery:   - Cough and breathe deeply. This helps to clear and expand your lungs and can prevent pneumonia after surgery.  - [place]. Do mild physical activity. Walking or moving your legs help your circulation and body functions return to normal. Do not try to get up or walk alone the first time after surgery.   -If you develop swelling on one leg or the other, pain in the back of your leg, redness/warmth in one of your legs, please call the office or go to the Emergency Room to have a doppler to rule out a blood clot. For shortness of breath, chest pain-seek care in the Emergency Room as soon as possible. - Actively manage your pain. Managing your pain lets you move in comfort. We will ask you to rate your  pain on a scale of zero to 10. It is your responsibility to tell your doctor or nurse where and how much you hurt so your pain can be treated.   Special Considerations -Your final pathology results from surgery should be available around one week after surgery and the results will be relayed to you when available.   -FMLA forms can be faxed to [phone] and please allow 5-7 business days for completion.   Pain Management After Surgery -Make sure that you have Tylenol and Ibuprofen at home to use on a regular basis after surgery for pain control. We recommend alternating the medications every hour to six hours since they work differently and are processed in the body differently for pain relief.   Bowel Regimen - It is important to prevent constipation and drink adequate amounts of liquids.   Risks of Surgery Risks of surgery are low but include bleeding, infection, damage to surrounding structures, re-operation, blood clots, and very rarely death.   AFTER SURGERY INSTRUCTIONS   Return to work:  1-2 days if applicable   Activity: 1. Be up and out of the bed during the day.  Take a nap if needed.  You may walk up steps but be careful and use the hand rail.  Stair climbing will tire you more than you think, you may need to stop part way and rest.    2. No lifting  or straining for 1 week over 10 pounds. No pushing, pulling, straining for 1 week.   3. No driving for minimum 24 hours after surgery if you were cleared to drive before surgery.  Do not drive if you are taking narcotic pain medicine and make sure that your reaction time has returned.    4. You can shower as soon as the next day after surgery. Shower daily. No tub baths or submerging your body in water until cleared by your surgeon.    5. No sexual activity and nothing in the vagina for 4 weeks.   6. You may experience vaginal spotting and discharge after surgery.  The spotting is normal but if you experience heavy bleeding,  call our office.   9. Take Tylenol or ibuprofen for pain if you are able to take these medications.  Monitor your Tylenol intake to a max of 4,000 mg in a 24 hour period.    Diet: 1. Low sodium Heart Healthy Diet is recommended but you are cleared to resume your normal (before surgery) diet after your procedure.   2. It is safe to use a laxative, such as Miralax or Colace, if you have difficulty moving your bowels.    Reasons to call the Doctor: Fever - Oral temperature greater than 100.4 degrees Fahrenheit Foul-smelling vaginal discharge Difficulty urinating Nausea and vomiting Difficulty breathing with or without chest pain New calf pain especially if only on one side Sudden, continuing increased vaginal bleeding with or without clots.   Contacts: For questions or concerns you should contact:   Dr. Jeral Pinch at [phone]   Joylene John, NP at [phone]   After Hours: call [phone] and have the GYN Oncologist paged/contacted (after 5 pm or on the weekends).   Messages sent via mychart are for non-urgent matters and are not responded to after hours so for urgent needs, please call the after hours number.

## 2021-08-16 NOTE — H&P (View-Only) (Signed)
GYNECOLOGIC ONCOLOGY NEW PATIENT CONSULTATION   Patient Name: Riyanna Crutchley Abdallah  Patient Age: 80 y.o. Date of Service: 08/16/21 Referring Provider: Dr. Aletha Halim  Primary Care Provider: Lavone Orn, MD Consulting Provider: Jeral Pinch, MD   Assessment/Plan:  Postmenopausal patient incidentally found to have bilateral adnexal masses, thickened endometrium.  I reviewed with the patient and her daughter findings from recent imaging studies as well as her endometrial biopsy. Her endometrium is quite thickened and concerning for a possible hyperplasia or malignancy. She underwent recent endometrial biopsy but only scant tissue was noted. Despite results showing benign endometrium, I am concerned that her endometrium was not sufficiently sampled. I recommend that we proceed with additional sampling under anesthesia with addition of hysteroscopy for intracavity evaluation and directed biopsy.  With regard to her adnexal masses, discussed finding of one appearing simple without concerning features and the other solid, most consistent with a fibroid or a benign ovarian tumor. While her CEA was mildly elevated, other tumor markers were normal. It is difficult to know if CEA is related to either adnexal mass. While imaging features are overall quite reassuring, there is some mild nodularity appreciated on pelvic exam. Will await pathology results from upcoming endometrial sampling. If malignant or pre-cancerous, will proceed with definitive surgery. If benign, then will discuss either close imaging follow up versus surgical excision of her adnexa.  Given strong family history, I discussed genetics referral with the patient today. She was amenable to referral.  Plan discussed today for hysteroscopy, endometrial sampling, any other indicated procedures. Procedure reviewed and risks detailed including but not limited to bleeding, need for transfusion, infection, damage to surrounding  structures, need for additional procedure, complications from anesthesia, DVT, and rarely death. Perioperative teaching performed today.  A copy of this note was sent to the patient's referring provider.   85 minutes of total time was spent for this patient encounter, including preparation, face-to-face counseling with the patient and coordination of care, and documentation of the encounter.   Jeral Pinch, MD  Division of Gynecologic Oncology  Department of Obstetrics and Gynecology  [facility]  ___________________________________________  Chief Complaint: Chief Complaint  Patient presents with   Ovarian cyst, bilateral   Thickened endometrium    History of Present Illness:  Dustin Burrill is a 80 y.o. y.o. female who is seen in consultation at the request of Dr. Ilda Basset for an evaluation of thickened endometrium, bilateral ovarian masses.  Patient was recently admitted from 11/28-2012/7 after presenting with right upper quadrant pain, fever, and nausea and vomiting.  She was ultimately diagnosed with community-acquired pneumonia and received antibiotics with resolution of her symptoms.  During her work-up, she had a CT of the abdomen and pelvis that showed cystic bilateral adnexal masses and heterogenous endometrial thickening.  Pelvic ultrasound and MRI were recommended and plan was made for patient to follow-up with GYN outpatient.   The patient saw Dr. Ilda Basset on 12/14.  She underwent endometrial biopsy that showed scant superficial strips of atrophic endometrium, no atypia or malignancy noted.   Tumor markers were drawn and as follows CEA: 4.9 CA-125: 15.7 CA 19-9: 26  Patient presents with her daughter today.  She notes overall doing well and continues to recover from recent hospitalization.  She is using a walker when out of the house are worried that she may have to walk for longer periods of time.  She is working with home health  and home PT who have noted good progress.  Patient denies any postmenopausal bleeding or discharge, denies any pelvic pain.  She had many days of diarrhea during her hospitalization and after.  She then had normalization of her stool function and for the last several days has felt more constipated.  She has some urinary frequency when she takes Lasix, otherwise denies urinary symptoms.  Appetite is improving, denies any nausea or emesis.  Given right upper quadrant pain, the patient underwent endoscopy on 12/2 with no findings noted.  Patient's family history is notable for colon cancer in her father and paternal grandfather.  Patient underwent frequent colonoscopies and earlier screening given this family history.  She had no findings on colonoscopy until her 15s.  She had multiple polyps noted at her last colonoscopy with recommendation for repeat colonoscopy in 5 years.  PAST MEDICAL HISTORY:  Past Medical History:  Diagnosis Date   Arthritis    affects knees and hands   BMI 37.0-37.9, adult    Diabetes mellitus without complication ([place])    Hypertension      PAST SURGICAL HISTORY:  Past Surgical History:  Procedure Laterality Date   BREAST BIOPSY Bilateral 1989   benign   BREAST EXCISIONAL BIOPSY Bilateral 1989   benign   ENDOMETRIAL BIOPSY  08/02/2021   ESOPHAGOGASTRODUODENOSCOPY (EGD) WITH PROPOFOL N/A 07/21/2021   Procedure: ESOPHAGOGASTRODUODENOSCOPY (EGD) WITH PROPOFOL;  Surgeon: Clarene Essex, MD;  Location: [facility];  Service: Endoscopy;  Laterality: N/A;   TUBAL LIGATION      OB/GYN HISTORY:  OB History  Gravida Para Term Preterm AB Living  2 2       2   SAB IAB Ectopic Multiple Live Births          2    # Outcome Date GA Lbr Len/2nd Weight Sex Delivery Anes PTL Lv  2 Para           1 Para             No LMP recorded. Patient is postmenopausal.  Age at menarche: 13 Age at menopause: 48 Hx of HRT: Denies Hx of STDs: Denies Last pap: 08/02/21 - negative, HR  HPV negative History of abnormal pap smears: Denies  SCREENING STUDIES:  Last mammogram: 2022  Last colonoscopy: 2020  MEDICATIONS: Outpatient Encounter Medications as of 08/16/2021  Medication Sig   acetaminophen (TYLENOL) 500 MG tablet Take 500 mg by mouth every 6 (six) hours as needed for moderate pain or headache.   furosemide (LASIX) 40 MG tablet Take 40 mg by mouth daily.   miconazole (MONISTAT 7) 2 % vaginal cream Place 1 Applicatorful vaginally at bedtime. Apply for seven nights   potassium chloride (KLOR-CON) 10 MEQ tablet Take 10 mEq by mouth daily.   rosuvastatin (CRESTOR) 5 MG tablet Take 5 mg by mouth daily.   telmisartan (MICARDIS) 80 MG tablet Take 80 mg by mouth daily.   colchicine 0.6 MG tablet Take 1 tablet (0.6 mg total) by mouth daily for 5 days.   fexofenadine (ALLEGRA) 180 MG tablet Take 180 mg by mouth daily as needed for allergies. (Patient not taking: Reported on 08/15/2021)   fluticasone (FLONASE) 50 MCG/ACT nasal spray Place 1 spray into both nostrils daily as needed for allergies. (Patient not taking: Reported on 08/15/2021)   No facility-administered encounter medications on file as of 08/16/2021.    ALLERGIES:  No Known Allergies   FAMILY HISTORY:  Family History  Problem Relation Age of Onset   Cancer Mother    Colon cancer Father  Colon cancer Paternal Grandfather    Breast cancer Neg Hx    Ovarian cancer Neg Hx    Endometrial cancer Neg Hx    Pancreatic cancer Neg Hx    Prostate cancer Neg Hx      SOCIAL HISTORY:  Social Connections: Not on file    REVIEW OF SYSTEMS:  Denies appetite changes, fevers, chills, fatigue, unexplained weight changes. Denies hearing loss, neck lumps or masses, mouth sores, ringing in ears or voice changes. Denies cough or wheezing.  Denies shortness of breath. Denies chest pain or palpitations. Denies leg swelling. Denies abdominal distention, pain, blood in stools, constipation, diarrhea, nausea, vomiting,  or early satiety. Denies pain with intercourse, dysuria, frequency, hematuria or incontinence. Denies hot flashes, pelvic pain, vaginal bleeding or vaginal discharge.   Denies joint pain, back pain or muscle pain/cramps. Denies itching, rash, or wounds. Denies dizziness, headaches, numbness or seizures. Denies swollen lymph nodes or glands, denies easy bruising or bleeding. Denies anxiety, depression, confusion, or decreased concentration.  Physical Exam:  Vital Signs for this encounter:  Blood pressure (!) 151/57, pulse 77, temperature 97.6 F (36.4 C), temperature source Tympanic, resp. rate 16, height 5\' 7"  (1.702 m), weight 236 lb 12.8 oz (107.4 kg), SpO2 95 %. Body mass index is 37.09 kg/m. General: Alert, oriented, no acute distress.  HEENT: Normocephalic, atraumatic. Sclera anicteric.  Chest: Clear to auscultation bilaterally. No wheezes, rhonchi, or rales. Cardiovascular: Regular rate and rhythm, no murmurs, rubs, or gallops.  Abdomen: Obese. Normoactive bowel sounds. Soft, nondistended, nontender to palpation. No masses or hepatosplenomegaly appreciated. No palpable fluid wave.  Well-healed infraumbilical incision with a small, approximately 3 cm umbilical hernia noted. Extremities: Grossly normal range of motion. Warm, well perfused. No edema bilaterally.  Skin: No rashes or lesions.  Lymphatics: No cervical, supraclavicular, or inguinal adenopathy.  GU:  Normal external female genitalia.  No lesions. No discharge or bleeding.             Bladder/urethra: Anterior prolapse noted.             Vagina: Mildly atrophic, no lesions or masses noted.             Cervix: Normal appearing, no lesions.             Uterus:  Small, mobile, no parametrial involvement.  There is what I think is the solid adnexal mass that I can appreciate in the midline within the posterior cul-de-sac that is free from both the rectum and uterus on bimanual and rectovaginal exam.  While portion of this is  smooth, there are also some small irregularities/nodules palpated.             Adnexa: See above.  Rectal: Confirms above findings.  LABORATORY AND RADIOLOGIC DATA:  Outside medical records were reviewed to synthesize the above history, along with the history and physical obtained during the visit.   Lab Results  Component Value Date   WBC 10.3 07/25/2021   HGB 12.8 07/25/2021   HCT 39.0 07/25/2021   PLT 564 (H) 07/25/2021   GLUCOSE 136 (H) 07/26/2021   ALT 25 07/20/2021   AST 34 07/20/2021   NA 139 07/26/2021   K 3.6 07/26/2021   CL 104 07/26/2021   CREATININE 0.84 07/26/2021   BUN 6 (L) 07/26/2021   CO2 26 07/26/2021   HGBA1C 6.8 (H) 07/17/2021   Pelvic MRI on 12/5: IMPRESSION: Diffuse endometrial thickening measuring 21 mm, with heterogeneous contrast enhancement. These findings are highly suspicious  for endometrial carcinoma in a postmenopausal female. Recommend gynecology consultation, and consideration of endometrial biopsy.   8.3 cm solid mass in the left adnexa, which abuts the left ovary and posterior lower uterine segment. Differential diagnosis includes pedunculated fibroid and left ovarian fibrothecoma.   5.9 cm benign-appearing cyst in the right adnexa which is separate from the right ovary, consistent with benign paraovarian/paratubal cyst. If not surgically removed, recommend continued follow-up by ultrasound in 6 months.   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   No evidence of pelvic metastatic disease.  Sigmoid diverticulosis, without evidence of diverticulosis.  Pelvic ultrasound on 11/28: IMPRESSION: 1. Technically limited study. 2. Indeterminate left adnexal mass measuring 7.2 cm may be related to ovarian malignancy or other ovarian tumor. 3. Endometrium appears thickened and echogenic. Differential diagnosis includes endometrial hyperplasia, polyp or carcinoma. 4. Right ovary not visualized. 5. Small calcified uterine  fibroid.  CT A/P on 11/28: IMPRESSION: 1. Dense right middle lobe atelectasis or pneumonia. 2. Small right pleural effusion. 3. Mild bibasilar atelectasis. 4. 7.0 cm cystic right adnexal mass and 9.1 cm solid left adnexal mass. These could be further evaluated with elective pelvic ultrasound or MRI of the pelvis. 5. Thickened, heterogeneous endometrium, suspicious for endometrial carcinoma. 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 7. Sigmoid and distal descending colon diverticulosis.

## 2021-08-16 NOTE — Patient Instructions (Signed)
Preparing for your Surgery  Plan for surgery on August 29, 2021 with Dr. Jeral Pinch at [facility]. You will be scheduled for a dilation and curettage of the uterus with hysteroscopy and myosure.   Pre-operative Testing -You will receive a phone call from presurgical testing at [facility] to discuss surgery instructions and arrange for lab work if needed.  -Bring your insurance card, copy of an advanced directive if applicable, medication list.  -You should not be taking blood thinners or aspirin at least ten days prior to surgery unless instructed by your surgeon.  -Do not take supplements such as fish oil (omega 3), red yeast rice, turmeric before your surgery. You want to avoid medications with aspirin in them including headache powders such as BC or Goody's), Excedrin migraine.  Day Before Surgery at [facility] will be advised you can have clear liquids up until 3 hours before your surgery.    Your role in recovery Your role is to become active as soon as directed by your doctor, while still giving yourself time to heal.  Rest when you feel tired. You will be asked to do the following in order to speed your recovery:  - Cough and breathe deeply. This helps to clear and expand your lungs and can prevent pneumonia after surgery.  - [place]. Do mild physical activity. Walking or moving your legs help your circulation and body functions return to normal. Do not try to get up or walk alone the first time after surgery.   -If you develop swelling on one leg or the other, pain in the back of your leg, redness/warmth in one of your legs, please call the office or go to the Emergency Room to have a doppler to rule out a blood clot. For shortness of breath, chest pain-seek care in the Emergency Room as soon as possible. - Actively manage your pain. Managing your pain lets you move in comfort. We will ask you to rate your pain on a  scale of zero to 10. It is your responsibility to tell your doctor or nurse where and how much you hurt so your pain can be treated.  Special Considerations -Your final pathology results from surgery should be available around one week after surgery and the results will be relayed to you when available.  -FMLA forms can be faxed to [phone] and please allow 5-7 business days for completion.  Pain Management After Surgery -Make sure that you have Tylenol and Ibuprofen at home to use on a regular basis after surgery for pain control. We recommend alternating the medications every hour to six hours since they work differently and are processed in the body differently for pain relief.  Bowel Regimen - It is important to prevent constipation and drink adequate amounts of liquids.  Risks of Surgery Risks of surgery are low but include bleeding, infection, damage to surrounding structures, re-operation, blood clots, and very rarely death.  AFTER SURGERY INSTRUCTIONS  Return to work:  1-2 days if applicable  Activity: 1. Be up and out of the bed during the day.  Take a nap if needed.  You may walk up steps but be careful and use the hand rail.  Stair climbing will tire you more than you think, you may need to stop part way and rest.   2. No lifting or straining for 1 week over 10 pounds. No pushing, pulling, straining for 1 week.  3.  No driving for minimum 24 hours after surgery if you were cleared to drive before surgery.  Do not drive if you are taking narcotic pain medicine and make sure that your reaction time has returned.   4. You can shower as soon as the next day after surgery. Shower daily. No tub baths or submerging your body in water until cleared by your surgeon.   5. No sexual activity and nothing in the vagina for 4 weeks.  6. You may experience vaginal spotting and discharge after surgery.  The spotting is normal but if you experience heavy bleeding, call our office.  9.  Take Tylenol or ibuprofen for pain if you are able to take these medications.  Monitor your Tylenol intake to a max of 4,000 mg in a 24 hour period.   Diet: 1. Low sodium Heart Healthy Diet is recommended but you are cleared to resume your normal (before surgery) diet after your procedure.  2. It is safe to use a laxative, such as Miralax or Colace, if you have difficulty moving your bowels.   Reasons to call the Doctor: Fever - Oral temperature greater than 100.4 degrees Fahrenheit Foul-smelling vaginal discharge Difficulty urinating Nausea and vomiting Difficulty breathing with or without chest pain New calf pain especially if only on one side Sudden, continuing increased vaginal bleeding with or without clots.   Contacts: For questions or concerns you should contact:  Dr. Jeral Pinch at [phone]  Joylene John, NP at [phone]  After Hours: call [phone] and have the GYN Oncologist paged/contacted (after 5 pm or on the weekends).  Messages sent via mychart are for non-urgent matters and are not responded to after hours so for urgent needs, please call the after hours number.

## 2021-08-16 NOTE — Progress Notes (Signed)
GYNECOLOGIC ONCOLOGY NEW PATIENT CONSULTATION   Patient Name: Jade Sanchez  Patient Age: 80 y.o. Date of Service: 08/16/21 Referring Provider: Dr. Aletha Halim  Primary Care Provider: Lavone Orn, MD Consulting Provider: Jeral Pinch, MD   Assessment/Plan:  Postmenopausal patient incidentally found to have bilateral adnexal masses, thickened endometrium.  I reviewed with the patient and her daughter findings from recent imaging studies as well as her endometrial biopsy. Her endometrium is quite thickened and concerning for a possible hyperplasia or malignancy. She underwent recent endometrial biopsy but only scant tissue was noted. Despite results showing benign endometrium, I am concerned that her endometrium was not sufficiently sampled. I recommend that we proceed with additional sampling under anesthesia with addition of hysteroscopy for intracavity evaluation and directed biopsy.  With regard to her adnexal masses, discussed finding of one appearing simple without concerning features and the other solid, most consistent with a fibroid or a benign ovarian tumor. While her CEA was mildly elevated, other tumor markers were normal. It is difficult to know if CEA is related to either adnexal mass. While imaging features are overall quite reassuring, there is some mild nodularity appreciated on pelvic exam. Will await pathology results from upcoming endometrial sampling. If malignant or pre-cancerous, will proceed with definitive surgery. If benign, then will discuss either close imaging follow up versus surgical excision of her adnexa.  Given strong family history, I discussed genetics referral with the patient today. She was amenable to referral.  Plan discussed today for hysteroscopy, endometrial sampling, any other indicated procedures. Procedure reviewed and risks detailed including but not limited to bleeding, need for transfusion, infection, damage to surrounding  structures, need for additional procedure, complications from anesthesia, DVT, and rarely death. Perioperative teaching performed today.  A copy of this note was sent to the patient's referring provider.   85 minutes of total time was spent for this patient encounter, including preparation, face-to-face counseling with the patient and coordination of care, and documentation of the encounter.   Jeral Pinch, MD  Division of Gynecologic Oncology  Department of Obstetrics and Gynecology  [facility]  ___________________________________________  Chief Complaint: Chief Complaint  Patient presents with   Ovarian cyst, bilateral   Thickened endometrium    History of Present Illness:  Jade Sanchez is a 80 y.o. y.o. female who is seen in consultation at the request of Dr. Ilda Basset for an evaluation of thickened endometrium, bilateral ovarian masses.  Patient was recently admitted from 11/28-2012/7 after presenting with right upper quadrant pain, fever, and nausea and vomiting.  She was ultimately diagnosed with community-acquired pneumonia and received antibiotics with resolution of her symptoms.  During her work-up, she had a CT of the abdomen and pelvis that showed cystic bilateral adnexal masses and heterogenous endometrial thickening.  Pelvic ultrasound and MRI were recommended and plan was made for patient to follow-up with GYN outpatient.   The patient saw Dr. Ilda Basset on 12/14.  She underwent endometrial biopsy that showed scant superficial strips of atrophic endometrium, no atypia or malignancy noted.   Tumor markers were drawn and as follows CEA: 4.9 CA-125: 15.7 CA 19-9: 26  Patient presents with her daughter today.  She notes overall doing well and continues to recover from recent hospitalization.  She is using a walker when out of the house are worried that she may have to walk for longer periods of time.  She is working with home health  and home PT who have noted good progress.  Patient denies any postmenopausal bleeding or discharge, denies any pelvic pain.  She had many days of diarrhea during her hospitalization and after.  She then had normalization of her stool function and for the last several days has felt more constipated.  She has some urinary frequency when she takes Lasix, otherwise denies urinary symptoms.  Appetite is improving, denies any nausea or emesis.  Given right upper quadrant pain, the patient underwent endoscopy on 12/2 with no findings noted.  Patient's family history is notable for colon cancer in her father and paternal grandfather.  Patient underwent frequent colonoscopies and earlier screening given this family history.  She had no findings on colonoscopy until her 47s.  She had multiple polyps noted at her last colonoscopy with recommendation for repeat colonoscopy in 5 years.  PAST MEDICAL HISTORY:  Past Medical History:  Diagnosis Date   Arthritis    affects knees and hands   BMI 37.0-37.9, adult    Diabetes mellitus without complication ([place])    Hypertension      PAST SURGICAL HISTORY:  Past Surgical History:  Procedure Laterality Date   BREAST BIOPSY Bilateral 1989   benign   BREAST EXCISIONAL BIOPSY Bilateral 1989   benign   ENDOMETRIAL BIOPSY  08/02/2021   ESOPHAGOGASTRODUODENOSCOPY (EGD) WITH PROPOFOL N/A 07/21/2021   Procedure: ESOPHAGOGASTRODUODENOSCOPY (EGD) WITH PROPOFOL;  Surgeon: Clarene Essex, MD;  Location: [facility];  Service: Endoscopy;  Laterality: N/A;   TUBAL LIGATION      OB/GYN HISTORY:  OB History  Gravida Para Term Preterm AB Living  2 2       2   SAB IAB Ectopic Multiple Live Births          2    # Outcome Date GA Lbr Len/2nd Weight Sex Delivery Anes PTL Lv  2 Para           1 Para             No LMP recorded. Patient is postmenopausal.  Age at menarche: 77 Age at menopause: 57 Hx of HRT: Denies Hx of STDs: Denies Last pap: 08/02/21 - negative, HR  HPV negative History of abnormal pap smears: Denies  SCREENING STUDIES:  Last mammogram: 2022  Last colonoscopy: 2020  MEDICATIONS: Outpatient Encounter Medications as of 08/16/2021  Medication Sig   acetaminophen (TYLENOL) 500 MG tablet Take 500 mg by mouth every 6 (six) hours as needed for moderate pain or headache.   furosemide (LASIX) 40 MG tablet Take 40 mg by mouth daily.   miconazole (MONISTAT 7) 2 % vaginal cream Place 1 Applicatorful vaginally at bedtime. Apply for seven nights   potassium chloride (KLOR-CON) 10 MEQ tablet Take 10 mEq by mouth daily.   rosuvastatin (CRESTOR) 5 MG tablet Take 5 mg by mouth daily.   telmisartan (MICARDIS) 80 MG tablet Take 80 mg by mouth daily.   colchicine 0.6 MG tablet Take 1 tablet (0.6 mg total) by mouth daily for 5 days.   fexofenadine (ALLEGRA) 180 MG tablet Take 180 mg by mouth daily as needed for allergies. (Patient not taking: Reported on 08/15/2021)   fluticasone (FLONASE) 50 MCG/ACT nasal spray Place 1 spray into both nostrils daily as needed for allergies. (Patient not taking: Reported on 08/15/2021)   No facility-administered encounter medications on file as of 08/16/2021.    ALLERGIES:  No Known Allergies   FAMILY HISTORY:  Family History  Problem Relation Age of Onset   Cancer Mother    Colon cancer Father  Colon cancer Paternal Grandfather    Breast cancer Neg Hx    Ovarian cancer Neg Hx    Endometrial cancer Neg Hx    Pancreatic cancer Neg Hx    Prostate cancer Neg Hx      SOCIAL HISTORY:  Social Connections: Not on file    REVIEW OF SYSTEMS:  Denies appetite changes, fevers, chills, fatigue, unexplained weight changes. Denies hearing loss, neck lumps or masses, mouth sores, ringing in ears or voice changes. Denies cough or wheezing.  Denies shortness of breath. Denies chest pain or palpitations. Denies leg swelling. Denies abdominal distention, pain, blood in stools, constipation, diarrhea, nausea, vomiting,  or early satiety. Denies pain with intercourse, dysuria, frequency, hematuria or incontinence. Denies hot flashes, pelvic pain, vaginal bleeding or vaginal discharge.   Denies joint pain, back pain or muscle pain/cramps. Denies itching, rash, or wounds. Denies dizziness, headaches, numbness or seizures. Denies swollen lymph nodes or glands, denies easy bruising or bleeding. Denies anxiety, depression, confusion, or decreased concentration.  Physical Exam:  Vital Signs for this encounter:  Blood pressure (!) 151/57, pulse 77, temperature 97.6 F (36.4 C), temperature source Tympanic, resp. rate 16, height 5\' 7"  (1.702 m), weight 236 lb 12.8 oz (107.4 kg), SpO2 95 %. Body mass index is 37.09 kg/m. General: Alert, oriented, no acute distress.  HEENT: Normocephalic, atraumatic. Sclera anicteric.  Chest: Clear to auscultation bilaterally. No wheezes, rhonchi, or rales. Cardiovascular: Regular rate and rhythm, no murmurs, rubs, or gallops.  Abdomen: Obese. Normoactive bowel sounds. Soft, nondistended, nontender to palpation. No masses or hepatosplenomegaly appreciated. No palpable fluid wave.  Well-healed infraumbilical incision with a small, approximately 3 cm umbilical hernia noted. Extremities: Grossly normal range of motion. Warm, well perfused. No edema bilaterally.  Skin: No rashes or lesions.  Lymphatics: No cervical, supraclavicular, or inguinal adenopathy.  GU:  Normal external female genitalia.  No lesions. No discharge or bleeding.             Bladder/urethra: Anterior prolapse noted.             Vagina: Mildly atrophic, no lesions or masses noted.             Cervix: Normal appearing, no lesions.             Uterus:  Small, mobile, no parametrial involvement.  There is what I think is the solid adnexal mass that I can appreciate in the midline within the posterior cul-de-sac that is free from both the rectum and uterus on bimanual and rectovaginal exam.  While portion of this is  smooth, there are also some small irregularities/nodules palpated.             Adnexa: See above.  Rectal: Confirms above findings.  LABORATORY AND RADIOLOGIC DATA:  Outside medical records were reviewed to synthesize the above history, along with the history and physical obtained during the visit.   Lab Results  Component Value Date   WBC 10.3 07/25/2021   HGB 12.8 07/25/2021   HCT 39.0 07/25/2021   PLT 564 (H) 07/25/2021   GLUCOSE 136 (H) 07/26/2021   ALT 25 07/20/2021   AST 34 07/20/2021   NA 139 07/26/2021   K 3.6 07/26/2021   CL 104 07/26/2021   CREATININE 0.84 07/26/2021   BUN 6 (L) 07/26/2021   CO2 26 07/26/2021   HGBA1C 6.8 (H) 07/17/2021   Pelvic MRI on 12/5: IMPRESSION: Diffuse endometrial thickening measuring 21 mm, with heterogeneous contrast enhancement. These findings are highly suspicious  for endometrial carcinoma in a postmenopausal female. Recommend gynecology consultation, and consideration of endometrial biopsy.   8.3 cm solid mass in the left adnexa, which abuts the left ovary and posterior lower uterine segment. Differential diagnosis includes pedunculated fibroid and left ovarian fibrothecoma.   5.9 cm benign-appearing cyst in the right adnexa which is separate from the right ovary, consistent with benign paraovarian/paratubal cyst. If not surgically removed, recommend continued follow-up by ultrasound in 6 months.   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   No evidence of pelvic metastatic disease.  Sigmoid diverticulosis, without evidence of diverticulosis.  Pelvic ultrasound on 11/28: IMPRESSION: 1. Technically limited study. 2. Indeterminate left adnexal mass measuring 7.2 cm may be related to ovarian malignancy or other ovarian tumor. 3. Endometrium appears thickened and echogenic. Differential diagnosis includes endometrial hyperplasia, polyp or carcinoma. 4. Right ovary not visualized. 5. Small calcified uterine  fibroid.  CT A/P on 11/28: IMPRESSION: 1. Dense right middle lobe atelectasis or pneumonia. 2. Small right pleural effusion. 3. Mild bibasilar atelectasis. 4. 7.0 cm cystic right adnexal mass and 9.1 cm solid left adnexal mass. These could be further evaluated with elective pelvic ultrasound or MRI of the pelvis. 5. Thickened, heterogeneous endometrium, suspicious for endometrial carcinoma. 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 7. Sigmoid and distal descending colon diverticulosis.

## 2021-08-16 NOTE — Progress Notes (Signed)
Patient here for new patient consultation with Dr. Jeral Pinch and  for a pre-operative discussion prior to her scheduled surgery on August 29, 2021. She is scheduled for dilation and curettage of the uterus with hysteroscopy and myosure. The surgery was discussed in detail.  See after visit summary for additional details. Visual aids used to discuss items related to surgery including sequential compression stockings, multi-modal pain regimen including tylenol, female reproductive system to discuss surgery in detail.      Discussed post-op pain management in detail including the aspects of the enhanced recovery pathway. We discussed the use of tylenol post-op and to monitor for a maximum of 4,000 mg in a 24 hour period. She does not take NSAIDS. Discussed bowel regimen in detail.     Discussed the use of SCDs and measures to take at home to prevent DVT including frequent mobility.  Reportable signs and symptoms of DVT discussed.     5 minutes spent with the patient.  Verbalizing understanding of material discussed. No needs or concerns voiced at the end of the visit.   Advised patient and family to call for any needs.    This appointment is included in the global surgical bundle as pre-operative teaching and has no charge.

## 2021-08-17 DIAGNOSIS — E1122 Type 2 diabetes mellitus with diabetic chronic kidney disease: Secondary | ICD-10-CM | POA: Diagnosis not present

## 2021-08-17 DIAGNOSIS — M109 Gout, unspecified: Secondary | ICD-10-CM | POA: Diagnosis not present

## 2021-08-17 DIAGNOSIS — N838 Other noninflammatory disorders of ovary, fallopian tube and broad ligament: Secondary | ICD-10-CM | POA: Diagnosis not present

## 2021-08-17 DIAGNOSIS — N1831 Chronic kidney disease, stage 3a: Secondary | ICD-10-CM | POA: Diagnosis not present

## 2021-08-17 DIAGNOSIS — R1900 Intra-abdominal and pelvic swelling, mass and lump, unspecified site: Secondary | ICD-10-CM | POA: Diagnosis not present

## 2021-08-17 DIAGNOSIS — I129 Hypertensive chronic kidney disease with stage 1 through stage 4 chronic kidney disease, or unspecified chronic kidney disease: Secondary | ICD-10-CM | POA: Diagnosis not present

## 2021-08-18 ENCOUNTER — Other Ambulatory Visit: Payer: Medicare Other

## 2021-08-18 DIAGNOSIS — N1831 Chronic kidney disease, stage 3a: Secondary | ICD-10-CM | POA: Diagnosis not present

## 2021-08-18 DIAGNOSIS — E1122 Type 2 diabetes mellitus with diabetic chronic kidney disease: Secondary | ICD-10-CM | POA: Diagnosis not present

## 2021-08-18 DIAGNOSIS — E7800 Pure hypercholesterolemia, unspecified: Secondary | ICD-10-CM | POA: Diagnosis not present

## 2021-08-18 DIAGNOSIS — E1139 Type 2 diabetes mellitus with other diabetic ophthalmic complication: Secondary | ICD-10-CM | POA: Diagnosis not present

## 2021-08-18 DIAGNOSIS — M810 Age-related osteoporosis without current pathological fracture: Secondary | ICD-10-CM | POA: Diagnosis not present

## 2021-08-18 DIAGNOSIS — E11319 Type 2 diabetes mellitus with unspecified diabetic retinopathy without macular edema: Secondary | ICD-10-CM | POA: Diagnosis not present

## 2021-08-18 DIAGNOSIS — I10 Essential (primary) hypertension: Secondary | ICD-10-CM | POA: Diagnosis not present

## 2021-08-18 DIAGNOSIS — I129 Hypertensive chronic kidney disease with stage 1 through stage 4 chronic kidney disease, or unspecified chronic kidney disease: Secondary | ICD-10-CM | POA: Diagnosis not present

## 2021-08-18 DIAGNOSIS — K219 Gastro-esophageal reflux disease without esophagitis: Secondary | ICD-10-CM | POA: Diagnosis not present

## 2021-08-22 DIAGNOSIS — R1900 Intra-abdominal and pelvic swelling, mass and lump, unspecified site: Secondary | ICD-10-CM | POA: Diagnosis not present

## 2021-08-22 DIAGNOSIS — E1122 Type 2 diabetes mellitus with diabetic chronic kidney disease: Secondary | ICD-10-CM | POA: Diagnosis not present

## 2021-08-22 DIAGNOSIS — I129 Hypertensive chronic kidney disease with stage 1 through stage 4 chronic kidney disease, or unspecified chronic kidney disease: Secondary | ICD-10-CM | POA: Diagnosis not present

## 2021-08-22 DIAGNOSIS — N1831 Chronic kidney disease, stage 3a: Secondary | ICD-10-CM | POA: Diagnosis not present

## 2021-08-22 DIAGNOSIS — M109 Gout, unspecified: Secondary | ICD-10-CM | POA: Diagnosis not present

## 2021-08-22 DIAGNOSIS — N838 Other noninflammatory disorders of ovary, fallopian tube and broad ligament: Secondary | ICD-10-CM | POA: Diagnosis not present

## 2021-08-23 ENCOUNTER — Encounter (HOSPITAL_BASED_OUTPATIENT_CLINIC_OR_DEPARTMENT_OTHER): Payer: Self-pay | Admitting: Gynecologic Oncology

## 2021-08-23 ENCOUNTER — Other Ambulatory Visit: Payer: Self-pay

## 2021-08-23 NOTE — Progress Notes (Signed)
Spoke w/ via phone for pre-op interview---pt Lab needs dos----   I stat and ekg            Lab results------chest xray 07-24-2021 epic COVID test -----patient states asymptomatic no test needed Arrive at -------900 am 08-29-2021 NPO after MN NO Solid Food.  Clear liquids from MN until---800 am Med rec completed Medications to take morning of surgery -----rosuvastatin Diabetic medication -----diet controlled Patient instructed no nail polish to be worn day of surgery Patient instructed to bring photo id and insurance card day of surgery Patient aware to have Driver (ride ) / caregiver    for 24 hours after surgery driver daughter sherry woods, pt will arrange caregiver for night  of surgery Patient Special Instructions -----none Pre-Op special Istructions -----none Patient verbalized understanding of instructions that were given at this phone interview. Patient denies shortness of breath, chest pain, fever, cough at this phone interview.

## 2021-08-25 DIAGNOSIS — E1122 Type 2 diabetes mellitus with diabetic chronic kidney disease: Secondary | ICD-10-CM | POA: Diagnosis not present

## 2021-08-25 DIAGNOSIS — N1831 Chronic kidney disease, stage 3a: Secondary | ICD-10-CM | POA: Diagnosis not present

## 2021-08-25 DIAGNOSIS — I129 Hypertensive chronic kidney disease with stage 1 through stage 4 chronic kidney disease, or unspecified chronic kidney disease: Secondary | ICD-10-CM | POA: Diagnosis not present

## 2021-08-25 DIAGNOSIS — R1900 Intra-abdominal and pelvic swelling, mass and lump, unspecified site: Secondary | ICD-10-CM | POA: Diagnosis not present

## 2021-08-25 DIAGNOSIS — M109 Gout, unspecified: Secondary | ICD-10-CM | POA: Diagnosis not present

## 2021-08-25 DIAGNOSIS — N838 Other noninflammatory disorders of ovary, fallopian tube and broad ligament: Secondary | ICD-10-CM | POA: Diagnosis not present

## 2021-08-28 ENCOUNTER — Telehealth: Payer: Self-pay

## 2021-08-28 DIAGNOSIS — K219 Gastro-esophageal reflux disease without esophagitis: Secondary | ICD-10-CM | POA: Diagnosis not present

## 2021-08-28 DIAGNOSIS — K5730 Diverticulosis of large intestine without perforation or abscess without bleeding: Secondary | ICD-10-CM | POA: Diagnosis not present

## 2021-08-28 DIAGNOSIS — Z8701 Personal history of pneumonia (recurrent): Secondary | ICD-10-CM | POA: Diagnosis not present

## 2021-08-28 DIAGNOSIS — M109 Gout, unspecified: Secondary | ICD-10-CM | POA: Diagnosis not present

## 2021-08-28 DIAGNOSIS — R1900 Intra-abdominal and pelvic swelling, mass and lump, unspecified site: Secondary | ICD-10-CM | POA: Diagnosis not present

## 2021-08-28 DIAGNOSIS — K760 Fatty (change of) liver, not elsewhere classified: Secondary | ICD-10-CM | POA: Diagnosis not present

## 2021-08-28 DIAGNOSIS — N1831 Chronic kidney disease, stage 3a: Secondary | ICD-10-CM | POA: Diagnosis not present

## 2021-08-28 DIAGNOSIS — E1122 Type 2 diabetes mellitus with diabetic chronic kidney disease: Secondary | ICD-10-CM | POA: Diagnosis not present

## 2021-08-28 DIAGNOSIS — E7800 Pure hypercholesterolemia, unspecified: Secondary | ICD-10-CM | POA: Diagnosis not present

## 2021-08-28 DIAGNOSIS — E210 Primary hyperparathyroidism: Secondary | ICD-10-CM | POA: Diagnosis not present

## 2021-08-28 DIAGNOSIS — Z7951 Long term (current) use of inhaled steroids: Secondary | ICD-10-CM | POA: Diagnosis not present

## 2021-08-28 DIAGNOSIS — M810 Age-related osteoporosis without current pathological fracture: Secondary | ICD-10-CM | POA: Diagnosis not present

## 2021-08-28 DIAGNOSIS — I129 Hypertensive chronic kidney disease with stage 1 through stage 4 chronic kidney disease, or unspecified chronic kidney disease: Secondary | ICD-10-CM | POA: Diagnosis not present

## 2021-08-28 DIAGNOSIS — N838 Other noninflammatory disorders of ovary, fallopian tube and broad ligament: Secondary | ICD-10-CM | POA: Diagnosis not present

## 2021-08-28 NOTE — Telephone Encounter (Signed)
Telephone call to check on pre-operative status.  Patient compliant with pre-operative instructions.  Reinforced nothing to eat after midnight. Clear liquids until 0800. Patient to arrive at 0900. Only med to take morning of surgery is crestor.  No questions or concerns voiced.  Instructed to call for any needs.

## 2021-08-29 ENCOUNTER — Ambulatory Visit (HOSPITAL_BASED_OUTPATIENT_CLINIC_OR_DEPARTMENT_OTHER): Payer: Medicare Other | Admitting: Anesthesiology

## 2021-08-29 ENCOUNTER — Ambulatory Visit (HOSPITAL_BASED_OUTPATIENT_CLINIC_OR_DEPARTMENT_OTHER)
Admission: RE | Admit: 2021-08-29 | Discharge: 2021-08-29 | Disposition: A | Discharge status: Home / Self Care | Payer: Medicare Other | Attending: Gynecologic Oncology | Admitting: Gynecologic Oncology

## 2021-08-29 ENCOUNTER — Encounter (HOSPITAL_BASED_OUTPATIENT_CLINIC_OR_DEPARTMENT_OTHER)
Admission: RE | Disposition: A | Discharge status: Home / Self Care | Payer: Self-pay | Source: Home / Self Care | Attending: Gynecologic Oncology

## 2021-08-29 ENCOUNTER — Encounter (HOSPITAL_BASED_OUTPATIENT_CLINIC_OR_DEPARTMENT_OTHER): Payer: Self-pay | Admitting: Gynecologic Oncology

## 2021-08-29 ENCOUNTER — Other Ambulatory Visit: Payer: Self-pay

## 2021-08-29 DIAGNOSIS — M1990 Unspecified osteoarthritis, unspecified site: Secondary | ICD-10-CM | POA: Diagnosis not present

## 2021-08-29 DIAGNOSIS — K219 Gastro-esophageal reflux disease without esophagitis: Secondary | ICD-10-CM | POA: Insufficient documentation

## 2021-08-29 DIAGNOSIS — E119 Type 2 diabetes mellitus without complications: Secondary | ICD-10-CM | POA: Diagnosis not present

## 2021-08-29 DIAGNOSIS — I10 Essential (primary) hypertension: Secondary | ICD-10-CM | POA: Diagnosis not present

## 2021-08-29 DIAGNOSIS — N289 Disorder of kidney and ureter, unspecified: Secondary | ICD-10-CM | POA: Insufficient documentation

## 2021-08-29 DIAGNOSIS — Z87891 Personal history of nicotine dependence: Secondary | ICD-10-CM | POA: Diagnosis not present

## 2021-08-29 DIAGNOSIS — N840 Polyp of corpus uteri: Secondary | ICD-10-CM | POA: Insufficient documentation

## 2021-08-29 DIAGNOSIS — R9389 Abnormal findings on diagnostic imaging of other specified body structures: Secondary | ICD-10-CM | POA: Diagnosis not present

## 2021-08-29 DIAGNOSIS — N882 Stricture and stenosis of cervix uteri: Secondary | ICD-10-CM | POA: Diagnosis not present

## 2021-08-29 HISTORY — DX: Hyperparathyroidism, unspecified: E21.3

## 2021-08-29 HISTORY — PX: DILATATION & CURETTAGE/HYSTEROSCOPY WITH MYOSURE: SHX6511

## 2021-08-29 HISTORY — DX: Hyperlipidemia, unspecified: E78.5

## 2021-08-29 HISTORY — DX: Abnormal findings on diagnostic imaging of other specified body structures: R93.89

## 2021-08-29 LAB — GLUCOSE, CAPILLARY: Glucose-Capillary: 107 mg/dL — ABNORMAL HIGH (ref 70–99)

## 2021-08-29 SURGERY — DILATATION & CURETTAGE/HYSTEROSCOPY WITH MYOSURE
Anesthesia: General | Site: Uterus

## 2021-08-29 MED ORDER — ACETAMINOPHEN 500 MG PO TABS
1000.0000 mg | ORAL_TABLET | Freq: Once | ORAL | Status: AC
  Administered 2021-08-29: 1000 mg via ORAL

## 2021-08-29 MED ORDER — ACETAMINOPHEN 10 MG/ML IV SOLN: 1000.0000 mg | Freq: Once | INTRAVENOUS | Status: DC | PRN

## 2021-08-29 MED ORDER — ACETAMINOPHEN 160 MG/5ML PO SOLN: 1000.0000 mg | Freq: Once | ORAL | Status: DC | PRN

## 2021-08-29 MED ORDER — ACETAMINOPHEN 500 MG PO TABS
ORAL_TABLET | ORAL | Status: AC
  Filled 2021-08-29: qty 1

## 2021-08-29 MED ORDER — FENTANYL CITRATE (PF) 100 MCG/2ML IJ SOLN: 25.0000 ug | INTRAMUSCULAR | Status: DC | PRN

## 2021-08-29 MED ORDER — LACTATED RINGERS IV SOLN: INTRAVENOUS | Status: DC

## 2021-08-29 MED ORDER — OXYCODONE HCL 5 MG PO TABS: 5.0000 mg | ORAL_TABLET | Freq: Once | ORAL | Status: DC | PRN

## 2021-08-29 MED ORDER — FENTANYL CITRATE (PF) 100 MCG/2ML IJ SOLN
INTRAMUSCULAR | Status: AC
  Filled 2021-08-29: qty 2

## 2021-08-29 MED ORDER — OXYCODONE HCL 5 MG/5ML PO SOLN: 5.0000 mg | Freq: Once | ORAL | Status: DC | PRN

## 2021-08-29 MED ORDER — ACETAMINOPHEN 500 MG PO TABS: 1000.0000 mg | ORAL_TABLET | Freq: Once | ORAL | Status: DC | PRN

## 2021-08-29 MED ORDER — ACETAMINOPHEN 500 MG PO TABS: 500.0000 mg | ORAL_TABLET | ORAL | Status: DC

## 2021-08-29 MED ORDER — LIDOCAINE HCL (CARDIAC) PF 100 MG/5ML IV SOSY
PREFILLED_SYRINGE | INTRAVENOUS | Status: DC | PRN
Start: 2021-08-29 — End: 2021-08-29
  Administered 2021-08-29: 80 mg via INTRAVENOUS

## 2021-08-29 MED ORDER — ONDANSETRON HCL 4 MG/2ML IJ SOLN
INTRAMUSCULAR | Status: DC | PRN
Start: 2021-08-29 — End: 2021-08-29
  Administered 2021-08-29: 4 mg via INTRAVENOUS

## 2021-08-29 MED ORDER — SODIUM CHLORIDE 0.9 % IR SOLN
Status: DC | PRN
  Administered 2021-08-29: 3000 mL

## 2021-08-29 MED ORDER — PHENYLEPHRINE HCL (PRESSORS) 10 MG/ML IV SOLN
INTRAVENOUS | Status: DC | PRN
Start: 2021-08-29 — End: 2021-08-29
  Administered 2021-08-29 (×2): 80 ug via INTRAVENOUS

## 2021-08-29 MED ORDER — CHLOROPROCAINE HCL 1 % IJ SOLN
INTRAMUSCULAR | Status: DC | PRN
  Administered 2021-08-29: 10 mL

## 2021-08-29 MED ORDER — PROPOFOL 10 MG/ML IV BOLUS
INTRAVENOUS | Status: DC | PRN
Start: 2021-08-29 — End: 2021-08-29
  Administered 2021-08-29 (×2): 50 mg via INTRAVENOUS
  Administered 2021-08-29: 150 mg via INTRAVENOUS

## 2021-08-29 MED ORDER — FENTANYL CITRATE (PF) 100 MCG/2ML IJ SOLN
INTRAMUSCULAR | Status: DC | PRN
  Administered 2021-08-29: 50 ug via INTRAVENOUS

## 2021-08-29 SURGICAL SUPPLY — 20 items
BIPOLAR CUTTING LOOP 21FR (ELECTRODE)
CATH ROBINSON RED A/P 16FR (CATHETERS) IMPLANT
DEVICE MYOSURE LITE (MISCELLANEOUS) IMPLANT
DEVICE MYOSURE REACH (MISCELLANEOUS) ×1 IMPLANT
DILATOR CANAL MILEX (MISCELLANEOUS) IMPLANT
DRSG TELFA 3X8 NADH (GAUZE/BANDAGES/DRESSINGS) ×2 IMPLANT
GAUZE 4X4 16PLY ~~LOC~~+RFID DBL (SPONGE) ×4 IMPLANT
GLOVE SURG ENC MOIS LTX SZ6 (GLOVE) ×2 IMPLANT
GLOVE SURG ENC MOIS LTX SZ6.5 (GLOVE) IMPLANT
GOWN STRL REUS W/TWL LRG LVL3 (GOWN DISPOSABLE) ×2 IMPLANT
IV NS IRRIG 3000ML ARTHROMATIC (IV SOLUTION) ×2 IMPLANT
KIT TURNOVER CYSTO (KITS) ×2 IMPLANT
LOOP CUTTING BIPOLAR 21FR (ELECTRODE) IMPLANT
PACK VAGINAL MINOR WOMEN LF (CUSTOM PROCEDURE TRAY) ×2 IMPLANT
PAD DRESSING TELFA 3X8 NADH (GAUZE/BANDAGES/DRESSINGS) ×1 IMPLANT
PAD OB MATERNITY 4.3X12.25 (PERSONAL CARE ITEMS) ×2 IMPLANT
PAD PREP 24X48 CUFFED NSTRL (MISCELLANEOUS) ×2 IMPLANT
SEAL ROD LENS SCOPE MYOSURE (ABLATOR) ×2 IMPLANT
TOWEL OR 17X26 10 PK STRL BLUE (TOWEL DISPOSABLE) ×2 IMPLANT
WATER STERILE IRR 500ML POUR (IV SOLUTION) ×2 IMPLANT

## 2021-08-29 NOTE — Anesthesia Postprocedure Evaluation (Signed)
Anesthesia Post Note  Patient: Jade Sanchez  Procedure(s) Performed: [place] (Uterus)     Patient location during evaluation: PACU Anesthesia Type: General Level of consciousness: awake and alert Pain management: pain level controlled Vital Signs Assessment: post-procedure vital signs reviewed and stable Respiratory status: spontaneous breathing, nonlabored ventilation, respiratory function stable and patient connected to nasal cannula oxygen Cardiovascular status: blood pressure returned to baseline and stable Postop Assessment: no apparent nausea or vomiting Anesthetic complications: no   No notable events documented.  Last Vitals:  Vitals:   08/29/21 1215 08/29/21 1243  BP: (!) 125/48 (!) 135/53  Pulse: 62 60  Resp: 19 16  Temp: (!) 36.4 C 36.4 C  SpO2: 97% 98%    Last Pain:  Vitals:   08/29/21 1300  TempSrc:   PainSc: 2                  Shaindy Reader

## 2021-08-29 NOTE — Discharge Instructions (Addendum)
AFTER SURGERY INSTRUCTIONS   Return to work:  1-2 days if applicable   Activity: 1. Be up and out of the bed during the day.  Take a nap if needed.  You may walk up steps but be careful and use the hand rail.  Stair climbing will tire you more than you think, you may need to stop part way and rest.    2. No lifting or straining for 1 week over 10 pounds. No pushing, pulling, straining for 1 week.   3. No driving for minimum 24 hours after surgery if you were cleared to drive before surgery.  Do not drive if you are taking narcotic pain medicine and make sure that your reaction time has returned.    4. You can shower as soon as the next day after surgery. Shower daily. No tub baths or submerging your body in water until cleared by your surgeon.    5. No sexual activity and nothing in the vagina for 4 weeks.   6. You may experience vaginal spotting and discharge after surgery.  The spotting is normal but if you experience heavy bleeding, call our office.   7. Take Tylenol for pain if you are able to take these medication.  Monitor your Tylenol intake to a max of 4,000 mg in a 24 hour period.    Diet: 1. You can resume your normal diet after your procedure.   2. It is safe to use a laxative, such as Miralax or Colace, if you have difficulty moving your bowels.    Reasons to call the Doctor: Fever - Oral temperature greater than 100.4 degrees Fahrenheit Foul-smelling vaginal discharge Difficulty urinating Nausea and vomiting Difficulty breathing with or without chest pain New calf pain especially if only on one side Sudden, continuing increased vaginal bleeding with or without clots.   Contacts: For questions or concerns you should contact:   Dr. Jeral Pinch at [phone]   Joylene John, NP at [phone]   After Hours: call [phone] and have the GYN Oncologist paged/contacted (after 5 pm or on the weekends).   Messages sent via mychart are for non-urgent matters  and are not responded to after hours so for urgent needs, please call the after hours number.   Post Anesthesia Home Care Instructions  Activity: Get plenty of rest for the remainder of the day. A responsible individual must stay with you for 24 hours following the procedure.  For the next 24 hours, DO NOT: -Drive a car -Paediatric nurse -Drink alcoholic beverages -Take any medication unless instructed by your physician -Make any legal decisions or sign important papers.  Meals: Start with liquid foods such as gelatin or soup. Progress to regular foods as tolerated. Avoid greasy, spicy, heavy foods. If nausea and/or vomiting occur, drink only clear liquids until the nausea and/or vomiting subsides. Call your physician if vomiting continues.  Special Instructions/Symptoms: Your throat may feel dry or sore from the anesthesia or the breathing tube placed in your throat during surgery. If this causes discomfort, gargle with warm salt water. The discomfort should disappear within 24 hours.

## 2021-08-29 NOTE — Transfer of Care (Signed)
Immediate Anesthesia Transfer of Care Note  Patient: Jade Sanchez  Procedure(s) Performed: DILATATION & CURETTAGE/HYSTEROSCOPY WITH MYOSURE (Uterus)  Patient Location: PACU  Anesthesia Type:General  Level of Consciousness: awake, alert , oriented and patient cooperative  Airway & Oxygen Therapy: Patient Spontanous Breathing and Patient connected to nasal cannula oxygen  Post-op Assessment: Report given to RN and Post -op Vital signs reviewed and stable  Post vital signs: Reviewed and stable  Last Vitals:  Vitals Value Taken Time  BP    Temp    Pulse    Resp    SpO2      Last Pain:  Vitals:   08/29/21 0944  TempSrc: Oral  PainSc: 0-No pain      Patients Stated Pain Goal: 4 (29/93/71 [id number])  Complications: No notable events documented.

## 2021-08-29 NOTE — Anesthesia Preprocedure Evaluation (Signed)
Anesthesia Evaluation  Patient identified by MRN, date of birth, ID band Patient awake    Reviewed: Allergy & Precautions, NPO status , Patient's Chart, lab work & pertinent test results  History of Anesthesia Complications Negative for: history of anesthetic complications  Airway Mallampati: III  TM Distance: >3 FB Neck ROM: Full    Dental  (+) Dental Advisory Given, Teeth Intact   Pulmonary neg shortness of breath, neg sleep apnea, neg COPD, neg recent URI, former smoker,    breath sounds clear to auscultation       Cardiovascular hypertension, Pt. on medications (-) angina(-) Past MI and (-) CHF  Rhythm:Regular     Neuro/Psych negative neurological ROS  negative psych ROS   GI/Hepatic Neg liver ROS, GERD  ,  Endo/Other  negative endocrine ROSdiabetes  Renal/GU Renal diseaseLab Results      Component                Value               Date                      CREATININE               0.84                07/26/2021                Musculoskeletal  (+) Arthritis ,   Abdominal   Peds  Hematology negative hematology ROS (+) Lab Results      Component                Value               Date                      WBC                      10.3                07/25/2021                HGB                      12.8                07/25/2021                HCT                      39.0                07/25/2021                MCV                      92.4                07/25/2021                PLT                      564 (H)             07/25/2021              Anesthesia Other Findings   Reproductive/Obstetrics THICKENED ENDOMETRIUM  Anesthesia Physical Anesthesia Plan  ASA: 2  Anesthesia Plan: General   Post-op Pain Management: Tylenol PO (pre-op)   Induction: Intravenous  PONV Risk Score and Plan: 3 and Ondansetron and Dexamethasone  Airway Management  Planned: LMA  Additional Equipment: None  Intra-op Plan:   Post-operative Plan: Extubation in OR  Informed Consent: I have reviewed the patients History and Physical, chart, labs and discussed the procedure including the risks, benefits and alternatives for the proposed anesthesia with the patient or authorized representative who has indicated his/her understanding and acceptance.     Dental advisory given  Plan Discussed with: CRNA and Anesthesiologist  Anesthesia Plan Comments:         Anesthesia Quick Evaluation

## 2021-08-29 NOTE — Interval H&P Note (Signed)
History and Physical Interval Note:  08/29/2021 10:39 AM  Jade Sanchez  has presented today for surgery, with the diagnosis of THICKENED ENDOMETRIUM.  The various methods of treatment have been discussed with the patient and family. After consideration of risks, benefits and other options for treatment, the patient has consented to  Procedure(s): [place] (N/A) as a surgical intervention.  The patient's history has been reviewed, patient examined, no change in status, stable for surgery.  I have reviewed the patient's chart and labs.  Questions were answered to the patient's satisfaction.     [name]

## 2021-08-29 NOTE — Anesthesia Procedure Notes (Signed)
Procedure Name: LMA Insertion Date/Time: 08/29/2021 11:16 AM Performed by: Georgeanne Nim, CRNA Pre-anesthesia Checklist: Patient identified, Emergency Drugs available, Suction available, Patient being monitored and Timeout performed Patient Re-evaluated:Patient Re-evaluated prior to induction Oxygen Delivery Method: Circle system utilized Preoxygenation: Pre-oxygenation with 100% oxygen Induction Type: IV induction Ventilation: Mask ventilation without difficulty LMA: LMA inserted LMA Size: 4.0 Number of attempts: 1 Placement Confirmation: positive ETCO2 and CO2 detector Tube secured with: Tape Dental Injury: Teeth and Oropharynx as per pre-operative assessment

## 2021-08-29 NOTE — Op Note (Addendum)
OPERATIVE NOTE  PATIENT: Jade Sanchez DATE: 08/29/21  Preop Diagnosis: Thickened endometrium  Postoperative Diagnosis: same, large endometrial polyp  Surgery: D&C (dilation and curettage) under hysteroscopic guidance with the Myosure  Surgeons:  Valarie Cones MD  Assistant: none  Anesthesia: General   Estimated blood loss: 20 ml  IVF:  see I&O flowsheet   Urine output: n/a   Fluid balance: -629 cc  Complications: None apparent  Pathology: endometrial curetteings with uterine polyp  Operative findings: On EUA, small mobile uterus. Firm mass within the cul de sac, several small areas of nodularity, mobile. Stenosis of the cervical canal noted. Endometrium mostly atrophic. 2 cm polyp lesion noted to be filling the endometrial cavity, eminating from anterior/fundal region. 3 mm polypoid lesion arising within the opening of the tubal ostia on the right and second lesion along posterior mid uterine body.   Procedure: The patient was identified in the preoperative holding area. Informed consent was signed on the chart. Patient was seen history was reviewed and exam was performed.   The patient was then taken to the operating room and placed in the supine position with SCD hose on. General anesthesia was then induced without difficulty. She was then placed in the dorsolithotomy position. The perineum was prepped with Betadine. The vagina was prepped with Betadine. The patient was then draped after the prep was dried.   Timeout was performed the patient, procedure, antibiotic, allergy, and length of procedure.   The speculum was placed in the vagina. The single tooth tenaculum was placed on the anterior lip of the cervix. The uterine sound was placed in the cervix and advanced to 3 cm.   The Myosure hysteroscope was then used to hydrodissect the cervix given stenosis near the endocervical os. Findings as noted above. The Myosure Reach was used to excise all polypoid tissue  appreciated. Specimen was collected in the specimen sock and sent to pathology.   The hysteroscope was removed.   The tenaculum was removed and bleeding noted at the tenaculum site. Monopolar electrocautery was used to achieve hemostasis.   The vagina was irrigated.  All instrument, suture, laparotomy, Ray-Tec, and needle counts were correct x2. The patient tolerated the procedure well and was taken recovery room in stable condition.  [name], MD

## 2021-08-30 ENCOUNTER — Encounter (HOSPITAL_BASED_OUTPATIENT_CLINIC_OR_DEPARTMENT_OTHER): Payer: Self-pay | Admitting: Gynecologic Oncology

## 2021-08-30 ENCOUNTER — Telehealth: Payer: Self-pay

## 2021-08-30 LAB — SURGICAL PATHOLOGY

## 2021-08-30 NOTE — Telephone Encounter (Signed)
Spoke with [name] this morning. She states she is eating, drinking and urinating well. She is passing gas and has had a BM this morning. Encouraged her to drink plenty of fluids throughout the day.  She denies fever or chills. She reports light vaginal spotting and cramping which is to be expected.  She rates her pain 2/10. Her pain is controlled with tylenol and a heating pad.    Instructed to call office with any fever, chills, purulent drainage, uncontrolled pain, increased vaginal bleeding or any other questions or concerns. Patient verbalizes understanding.   Pt aware of post op appointments as well as the office number [phone] and after hours number [phone] to call if she has any questions or concerns

## 2021-09-06 DIAGNOSIS — N838 Other noninflammatory disorders of ovary, fallopian tube and broad ligament: Secondary | ICD-10-CM | POA: Diagnosis not present

## 2021-09-06 DIAGNOSIS — R1900 Intra-abdominal and pelvic swelling, mass and lump, unspecified site: Secondary | ICD-10-CM | POA: Diagnosis not present

## 2021-09-06 DIAGNOSIS — M109 Gout, unspecified: Secondary | ICD-10-CM | POA: Diagnosis not present

## 2021-09-06 DIAGNOSIS — E1122 Type 2 diabetes mellitus with diabetic chronic kidney disease: Secondary | ICD-10-CM | POA: Diagnosis not present

## 2021-09-06 DIAGNOSIS — N1831 Chronic kidney disease, stage 3a: Secondary | ICD-10-CM | POA: Diagnosis not present

## 2021-09-06 DIAGNOSIS — I129 Hypertensive chronic kidney disease with stage 1 through stage 4 chronic kidney disease, or unspecified chronic kidney disease: Secondary | ICD-10-CM | POA: Diagnosis not present

## 2021-09-08 DIAGNOSIS — I129 Hypertensive chronic kidney disease with stage 1 through stage 4 chronic kidney disease, or unspecified chronic kidney disease: Secondary | ICD-10-CM | POA: Diagnosis not present

## 2021-09-08 DIAGNOSIS — E1122 Type 2 diabetes mellitus with diabetic chronic kidney disease: Secondary | ICD-10-CM | POA: Diagnosis not present

## 2021-09-08 DIAGNOSIS — N838 Other noninflammatory disorders of ovary, fallopian tube and broad ligament: Secondary | ICD-10-CM | POA: Diagnosis not present

## 2021-09-08 DIAGNOSIS — M109 Gout, unspecified: Secondary | ICD-10-CM | POA: Diagnosis not present

## 2021-09-08 DIAGNOSIS — N1831 Chronic kidney disease, stage 3a: Secondary | ICD-10-CM | POA: Diagnosis not present

## 2021-09-08 DIAGNOSIS — R1900 Intra-abdominal and pelvic swelling, mass and lump, unspecified site: Secondary | ICD-10-CM | POA: Diagnosis not present

## 2021-09-12 DIAGNOSIS — R1900 Intra-abdominal and pelvic swelling, mass and lump, unspecified site: Secondary | ICD-10-CM | POA: Diagnosis not present

## 2021-09-12 DIAGNOSIS — N838 Other noninflammatory disorders of ovary, fallopian tube and broad ligament: Secondary | ICD-10-CM | POA: Diagnosis not present

## 2021-09-12 DIAGNOSIS — I129 Hypertensive chronic kidney disease with stage 1 through stage 4 chronic kidney disease, or unspecified chronic kidney disease: Secondary | ICD-10-CM | POA: Diagnosis not present

## 2021-09-12 DIAGNOSIS — E1122 Type 2 diabetes mellitus with diabetic chronic kidney disease: Secondary | ICD-10-CM | POA: Diagnosis not present

## 2021-09-12 DIAGNOSIS — M109 Gout, unspecified: Secondary | ICD-10-CM | POA: Diagnosis not present

## 2021-09-12 DIAGNOSIS — N1831 Chronic kidney disease, stage 3a: Secondary | ICD-10-CM | POA: Diagnosis not present

## 2021-09-13 ENCOUNTER — Other Ambulatory Visit: Payer: Self-pay | Admitting: Licensed Clinical Social Worker

## 2021-09-13 DIAGNOSIS — I129 Hypertensive chronic kidney disease with stage 1 through stage 4 chronic kidney disease, or unspecified chronic kidney disease: Secondary | ICD-10-CM | POA: Diagnosis not present

## 2021-09-13 DIAGNOSIS — E1122 Type 2 diabetes mellitus with diabetic chronic kidney disease: Secondary | ICD-10-CM | POA: Diagnosis not present

## 2021-09-13 DIAGNOSIS — Z800 Family history of malignant neoplasm of digestive organs: Secondary | ICD-10-CM

## 2021-09-13 DIAGNOSIS — R1900 Intra-abdominal and pelvic swelling, mass and lump, unspecified site: Secondary | ICD-10-CM | POA: Diagnosis not present

## 2021-09-13 DIAGNOSIS — N1831 Chronic kidney disease, stage 3a: Secondary | ICD-10-CM | POA: Diagnosis not present

## 2021-09-13 DIAGNOSIS — N838 Other noninflammatory disorders of ovary, fallopian tube and broad ligament: Secondary | ICD-10-CM | POA: Diagnosis not present

## 2021-09-13 DIAGNOSIS — M109 Gout, unspecified: Secondary | ICD-10-CM | POA: Diagnosis not present

## 2021-09-14 ENCOUNTER — Encounter: Payer: Self-pay | Admitting: Gynecologic Oncology

## 2021-09-18 ENCOUNTER — Inpatient Hospital Stay (HOSPITAL_BASED_OUTPATIENT_CLINIC_OR_DEPARTMENT_OTHER): Payer: Medicare Other | Admitting: Licensed Clinical Social Worker

## 2021-09-18 ENCOUNTER — Encounter: Payer: Self-pay | Admitting: Gynecologic Oncology

## 2021-09-18 ENCOUNTER — Other Ambulatory Visit: Payer: Self-pay

## 2021-09-18 ENCOUNTER — Inpatient Hospital Stay: Payer: Medicare Other

## 2021-09-18 ENCOUNTER — Encounter: Payer: Self-pay | Admitting: Licensed Clinical Social Worker

## 2021-09-18 ENCOUNTER — Inpatient Hospital Stay: Payer: Medicare Other | Attending: Gynecologic Oncology | Admitting: Gynecologic Oncology

## 2021-09-18 VITALS — BP 107/49 | HR 82 | Temp 97.7°F | Resp 16 | Ht 67.0 in | Wt 236.0 lb

## 2021-09-18 DIAGNOSIS — Z800 Family history of malignant neoplasm of digestive organs: Secondary | ICD-10-CM

## 2021-09-18 DIAGNOSIS — Z7189 Other specified counseling: Secondary | ICD-10-CM

## 2021-09-18 DIAGNOSIS — Z808 Family history of malignant neoplasm of other organs or systems: Secondary | ICD-10-CM

## 2021-09-18 DIAGNOSIS — R9389 Abnormal findings on diagnostic imaging of other specified body structures: Secondary | ICD-10-CM

## 2021-09-18 DIAGNOSIS — Z801 Family history of malignant neoplasm of trachea, bronchus and lung: Secondary | ICD-10-CM | POA: Diagnosis not present

## 2021-09-18 DIAGNOSIS — N9489 Other specified conditions associated with female genital organs and menstrual cycle: Secondary | ICD-10-CM

## 2021-09-18 DIAGNOSIS — N840 Polyp of corpus uteri: Secondary | ICD-10-CM

## 2021-09-18 LAB — GENETIC SCREENING ORDER

## 2021-09-18 NOTE — Progress Notes (Signed)
REFERRING PROVIDER: [name], MD [place],  [place] [address]  PRIMARY PROVIDER:  Lavone Orn, MD  PRIMARY REASON FOR VISIT:  1. Family history of colon cancer   2. Family history of melanoma      HISTORY OF PRESENT ILLNESS:   Jade Sanchez, a 81 y.o. female, was seen for a [facility] cancer genetics consultation at the request of Dr. Berline Lopes due to a family history of cancer.  Jade Sanchez presents to clinic today to discuss the possibility of a hereditary predisposition to cancer, genetic testing, and to further clarify her future cancer risks, as well as potential cancer risks for family members.   Jade Sanchez is a 81 y.o. female with no personal history of cancer.    CANCER HISTORY:  Oncology History   No history exists.     RISK FACTORS:  Menarche was at age 70.  First live birth at age 21.  OCP use for approximately 10 years.  Ovaries intact: yes.  Hysterectomy: no.  Menopausal status: postmenopausal.  HRT use: brief time Colonoscopy: yes;  polyps, less than 10 . Mammogram within the last year: yes. Number of breast biopsies: 2. Up to date with pelvic exams: yes. Any excessive radiation exposure in the past: no  Past Medical History:  Diagnosis Date   Arthritis    affects knees and hands   DM Type 2    diet controlled   Family history of colon cancer    Family history of melanoma    Gout attack 07/27/2021   left foot none since   Hyperlipidemia    Hyperparathyroidism ([place])    no meds taken monitored q 6 months by pcp   Hypertension    Thickened endometrium     Past Surgical History:  Procedure Laterality Date   BREAST BIOPSY Left    benign [place] N/A 08/29/2021   Procedure: DILATATION & CURETTAGE/HYSTEROSCOPY WITH MYOSURE;  Surgeon: [name], MD;  Location: [facility];  Service: Gynecology;  Laterality: N/A;   ENDOMETRIAL BIOPSY  08/02/2021    ESOPHAGOGASTRODUODENOSCOPY (EGD) WITH PROPOFOL N/A 07/21/2021   Procedure: ESOPHAGOGASTRODUODENOSCOPY (EGD) WITH PROPOFOL;  Surgeon: Clarene Essex, MD;  Location: [facility];  Service: Endoscopy;  Laterality: N/A;   MENISCUS REPAIR Left    yrs ago per pt on 08-23-2021   TUBAL LIGATION     late 30's    Social History   Socioeconomic History   Marital status: Divorced    Spouse name: Not on file   Number of children: Not on file   Years of education: Not on file   Highest education level: Not on file  Occupational History   Not on file  Tobacco Use   Smoking status: Former    Packs/day: 0.50    Years: 10.00    Pack years: 5.00    Types: Cigarettes    Quit date: 08/21/1975    Years since quitting: 46.1   Smokeless tobacco: Never  Vaping Use   Vaping Use: Never used  Substance and Sexual Activity   Alcohol use: Never   Drug use: Never   Sexual activity: Not Currently    Birth control/protection: None  Other Topics Concern   Not on file  Social History Narrative   Not on file   Social Determinants of Health   Financial Resource Strain: Not on file  Food Insecurity: No Food Insecurity   Worried About Running Out of Food  in the Last Year: Never true   [place] in the Last Year: Never true  Transportation Needs: No Transportation Needs   Lack of Transportation (Medical): No   Lack of Transportation (Non-Medical): No  Physical Activity: Not on file  Stress: Not on file  Social Connections: Not on file     FAMILY HISTORY:  We obtained a detailed, 4-generation family history.  Significant diagnoses are listed below: Family History  Problem Relation Age of Onset   Cancer Mother    Colon cancer Father    Colon cancer Paternal Grandfather    Breast cancer Neg Hx    Ovarian cancer Neg Hx    Endometrial cancer Neg Hx    Pancreatic cancer Neg Hx    Prostate cancer Neg Hx    Jade Sanchez has a son, 32, and a daughter, 39, no history of cancer. She has 2 sisters, 1  brother. Her sister has had melanoma at least twice, and her brother has had melanoma once. Her other sister had had other types of skin cancer.   Jade Sanchez's mother had cancer in a colon polyp in her late 32s, and metastatic cancer in her late 82s that was likely a lung primary. Patient had 1 full maternal aunt, no cancers. She had two half uncles, 1 half aunt, limited information. Maternal grandfather had unknown type of cancer and died over age 21, grandmother died under age 82.   Jade Sanchez's father was diagnosed with colon cancer in his 66s and died at 35. Patient had 2 paternal aunts, no cancers for them or for her cousins. Paternal grandmother died under age 43, not cancer related. Grandfather had colon cancer in his 71s and died in his 49s.   Jade Sanchez is unaware of previous family history of genetic testing for hereditary cancer risks. Patient's maternal ancestors are of English/Scottish descent, and paternal ancestors are of English/Scottish descent. There is no reported Ashkenazi Jewish ancestry. There is no known consanguinity.    GENETIC COUNSELING ASSESSMENT: Ms. Pohl is a 81 y.o. female with a family history of colon cancer and a family history of melanoma x3  which is somewhat suggestive of a hereditary cancer syndrome and predisposition to cancer. We, therefore, discussed and recommended the following at today's visit.   DISCUSSION: We discussed that approximately 10% of colorectal cancer is hereditary. Most cases of hereditary colorectal cancer are associated with Lynch genes, although there are other genes associated with hereditary colorectal cancer as well, and genes associated with melanoma. Cancers and risks are gene specific.  We discussed that testing is beneficial for several reasons including  knowing about other cancer risks, identifying potential screening and risk-reduction options that may be appropriate, and to understand if other family members could  be at risk for cancer and allow them to undergo genetic testing.   We reviewed the characteristics, features and inheritance patterns of hereditary cancer syndromes. We also discussed genetic testing, including the appropriate family members to test, the process of testing, insurance coverage and turn-around-time for results. We discussed the implications of a negative, positive and/or variant of uncertain significant result. We recommended Ms. Ruffins pursue genetic testing for the [facility] Multi-Cancer+RNA gene panel.   The Multi-Cancer Panel + RNA offered by [facility] includes sequencing and/or deletion duplication testing of the following 84 genes: AIP, ALK, APC, ATM, AXIN2,BAP1,  BARD1, BLM, BMPR1A, BRCA1, BRCA2, BRIP1, CASR, CDC73, CDH1, CDK4, CDKN1B, CDKN1C, CDKN2A (p14ARF), CDKN2A (p16INK4a), CEBPA, CHEK2, CTNNA1, DICER1, DIS3L2, EGFR (c.2369C>T,  p.Thr790Met variant only), EPCAM (Deletion/duplication testing only), FH, FLCN, GATA2, GPC3, GREM1 (Promoter region deletion/duplication testing only), HOXB13 (c.251G>A, p.Gly84Glu), HRAS, KIT, MAX, MEN1, MET, MITF (c.952G>A, p.Glu318Lys variant only), MLH1, MSH2, MSH3, MSH6, MUTYH, NBN, NF1, NF2, NTHL1, PALB2, PDGFRA, PHOX2B, PMS2, POLD1, POLE, POT1, PRKAR1A, PTCH1, PTEN, RAD50, RAD51C, RAD51D, RB1, RECQL4, RET, RUNX1, SDHAF2, SDHA (sequence changes only), SDHB, SDHC, SDHD, SMAD4, SMARCA4, SMARCB1, SMARCE1, STK11, SUFU, TERC, TERT, TMEM127, TP53, TSC1, TSC2, VHL, WRN and WT1.  Based on Ms. Gothard's family history of cancer, she meets medical criteria for genetic testing. Despite that she meets criteria, she may still have an out of pocket cost. We discussed that if her out of pocket cost for testing is over $100, the laboratory will call and confirm whether she wants to proceed with testing.  If the out of pocket cost of testing is less than $100 she will be billed by the genetic testing laboratory.   PLAN: After considering the risks, benefits, and  limitations, Ms. Fleisher provided informed consent to pursue genetic testing and the blood sample was sent to [facility] for analysis of the Multi-Cancer+RNA panel. Results should be available within approximately 2-3 weeks' time, at which point they will be disclosed by telephone to Ms. Vue, as will any additional recommendations warranted by these results. Ms. Downie will receive a summary of her genetic counseling visit and a copy of her results once available. This information will also be available in Epic.   Ms. Hyden's questions were answered to her satisfaction today. Our contact information was provided should additional questions or concerns arise. Thank you for the referral and allowing [place] to share in the care of your patient.   Faith Rogue, MS, [facility] Genetic Counselor [email or web address] Phone: [phone]  The patient was seen for a total of 35 minutes in face-to-face genetic counseling.  Patient was seen with her daughter, [name]. Dr. Grayland Ormond was available for discussion regarding this case.   _______________________________________________________________________ For Office Staff:  Number of people involved in session: 2 Was an Intern/ student involved with case: no

## 2021-09-18 NOTE — Patient Instructions (Signed)
It was good to see you today.  I am glad you have healed well from surgery.  Today we discussed options in terms of the masses involving or near your ovaries.  Because you are not having significant symptoms and given the way they appear on your MRI, we discussed the option of surgery now versus imaging follow-up in a couple of months.  I have ordered an MRI which we will get scheduled for April or May.  I will call you with these results once I have them to discuss next steps.  If you develop any new symptoms before your MRI, please call to see me sooner.

## 2021-09-18 NOTE — Progress Notes (Addendum)
Gynecologic Oncology Return Clinic Visit  09/18/2021  Reason for Visit: Follow-up after surgery, treatment discussion  Treatment History: MRI of the pelvis on 07/24/2021: Diffuse endometrial thickening measuring up to 21 mm with contrast enhancement, suspicious for endometrial cancer.  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  5.9 cm benign-appearing cyst in the right adnexa, separate from the right ovary, consistent with benign paraovarian or paratubal cyst.  No metastatic disease noted.  08/29/2021: D&C under hysteroscopic guidance with the MyoSure.  Findings Intra-Op included large uterine polyp.  Final pathology revealed benign endometrial polyp, no hyperplasia or malignancy.  Interval History: Patient is doing very well from a postoperative standpoint.  Notes several days of vaginal bleeding after surgery, denies any bleeding or discharge after that.  Denies any significant abdominal pain, had couple days of cramping after her procedure.  Notes some continued constipation.  Denies any urinary symptoms.  Past Medical/Surgical History: Past Medical History:  Diagnosis Date   Arthritis    affects knees and hands   DM Type 2    diet controlled   Family history of colon cancer    Family history of melanoma    Gout attack 07/27/2021   left foot none since   Hyperlipidemia    Hyperparathyroidism ([place])    no meds taken monitored q 6 months by pcp   Hypertension    Thickened endometrium     Past Surgical History:  Procedure Laterality Date   BREAST BIOPSY Left    benign [place] N/A 08/29/2021   Procedure: DILATATION & CURETTAGE/HYSTEROSCOPY WITH MYOSURE;  Surgeon: [name], MD;  Location: [facility];  Service: Gynecology;  Laterality: N/A;   ENDOMETRIAL BIOPSY  08/02/2021    ESOPHAGOGASTRODUODENOSCOPY (EGD) WITH PROPOFOL N/A 07/21/2021   Procedure: ESOPHAGOGASTRODUODENOSCOPY (EGD) WITH PROPOFOL;  Surgeon: Clarene Essex, MD;  Location: [facility];  Service: Endoscopy;  Laterality: N/A;   MENISCUS REPAIR Left    yrs ago per pt on 08-23-2021   TUBAL LIGATION     late 30's    Family History  Problem Relation Age of Onset   Cancer Mother    Colon cancer Father    Colon cancer Paternal Grandfather    Breast cancer Neg Hx    Ovarian cancer Neg Hx    Endometrial cancer Neg Hx    Pancreatic cancer Neg Hx    Prostate cancer Neg Hx     Social History   Socioeconomic History   Marital status: Divorced    Spouse name: Not on file   Number of children: Not on file   Years of education: Not on file   Highest education level: Not on file  Occupational History   Not on file  Tobacco Use   Smoking status: Former    Packs/day: 0.50    Years: 10.00    Pack years: 5.00    Types: Cigarettes    Quit date: 08/21/1975    Years since quitting: 46.1   Smokeless tobacco: Never  Vaping Use   Vaping Use: Never used  Substance and Sexual Activity   Alcohol use: Never   Drug use: Never   Sexual activity: Not Currently    Birth control/protection: None  Other Topics Concern   Not on file  Social History Narrative   Not on file   Social Determinants of Health   Financial  Resource Strain: Not on file  Food Insecurity: No Food Insecurity   Worried About Charity fundraiser in the Last Year: Never true   Ran Out of Food in the Last Year: Never true  Transportation Needs: No Transportation Needs   Lack of Transportation (Medical): No   Lack of Transportation (Non-Medical): No  Physical Activity: Not on file  Stress: Not on file  Social Connections: Not on file    Current Medications:  Current Outpatient Medications:    acetaminophen (TYLENOL) 500 MG tablet, Take 500 mg by mouth every 6 (six) hours as needed for moderate pain or headache., Disp: , Rfl:     furosemide (LASIX) 40 MG tablet, Take 40 mg by mouth daily., Disp: , Rfl:    potassium chloride (KLOR-CON) 10 MEQ tablet, Take 10 mEq by mouth daily., Disp: , Rfl:    rosuvastatin (CRESTOR) 5 MG tablet, Take 5 mg by mouth daily., Disp: , Rfl:    telmisartan (MICARDIS) 80 MG tablet, Take 80 mg by mouth daily., Disp: , Rfl:   Review of Systems: Denies appetite changes, fevers, chills, fatigue, unexplained weight changes. Denies hearing loss, neck lumps or masses, mouth sores, ringing in ears or voice changes. Denies cough or wheezing.  Denies shortness of breath. Denies chest pain or palpitations. Denies leg swelling. Denies abdominal distention, pain, blood in stools, diarrhea, nausea, vomiting, or early satiety. Denies pain with intercourse, dysuria, frequency, hematuria or incontinence. Denies hot flashes, pelvic pain, vaginal bleeding or vaginal discharge.   Denies joint pain, back pain or muscle pain/cramps. Denies itching, rash, or wounds. Denies dizziness, headaches, numbness or seizures. Denies swollen lymph nodes or glands, denies easy bruising or bleeding. Denies anxiety, depression, confusion, or decreased concentration.  Physical Exam: BP (!) 107/49 (BP Location: Left Arm, Patient Position: Sitting)    Pulse 82    Temp 97.7 F (36.5 C) (Tympanic)    Resp 16    Ht 5\' 7"  (1.702 m)    Wt 236 lb (107 kg)    SpO2 97%    BMI 36.96 kg/m  General: Alert, oriented, no acute distress. HEENT: Normocephalic, atraumatic, sclera anicteric. Chest: Labored breathing on room air.   Laboratory & Radiologic Studies: As above  Assessment & Plan: Jade Sanchez is a 81 y.o. woman with bilateral adnexal masses, thickened endometrial lining, now status post endometrial polypectomy with benign pathology.  Patient is doing very well from a postoperative standpoint and meeting all milestones.  We discussed today, with her daughter present, findings from MRI and need for additional  surgery.  We discussed 2 options, given overall benign appearance of both adnexal masses.  The first would be to move forward with definitive surgery with a BSO and plan for intraoperative frozen section.  If benign, no hysterectomy or other procedures would be indicated.  The second option, especially in the setting of appearance of both masses as well as her lack of symptoms, would be for close imaging follow-up.  This would mean repeating an MRI 3-6 months after her last 1.  Unless there is change to her symptoms or change in the appearance of the masses (they grow in size or look more concerning on imaging), than continued surveillance would be appropriate.  If the patient were to develop symptoms or her masses were to grow or look more concerning, then we would move forward with planning for surgery.  After discussing the risks and benefits of both options, the patient voiced wanting to proceed with surveillance  at this time.  We will plan to repeat an MRI in a couple of months.  I will call her when I have these results to discuss next steps.  She knows to call if she develops any new symptoms in the meantime.  28 minutes of total time was spent for this patient encounter, including preparation, face-to-face counseling with the patient and coordination of care, and documentation of the encounter.  Jeral Pinch, MD  Division of Gynecologic Oncology  Department of Obstetrics and Gynecology  [facility]

## 2021-09-19 DIAGNOSIS — E1122 Type 2 diabetes mellitus with diabetic chronic kidney disease: Secondary | ICD-10-CM | POA: Diagnosis not present

## 2021-09-19 DIAGNOSIS — N1831 Chronic kidney disease, stage 3a: Secondary | ICD-10-CM | POA: Diagnosis not present

## 2021-09-19 DIAGNOSIS — I129 Hypertensive chronic kidney disease with stage 1 through stage 4 chronic kidney disease, or unspecified chronic kidney disease: Secondary | ICD-10-CM | POA: Diagnosis not present

## 2021-09-19 DIAGNOSIS — R1900 Intra-abdominal and pelvic swelling, mass and lump, unspecified site: Secondary | ICD-10-CM | POA: Diagnosis not present

## 2021-09-19 DIAGNOSIS — M109 Gout, unspecified: Secondary | ICD-10-CM | POA: Diagnosis not present

## 2021-09-19 DIAGNOSIS — N838 Other noninflammatory disorders of ovary, fallopian tube and broad ligament: Secondary | ICD-10-CM | POA: Diagnosis not present

## 2021-09-21 DIAGNOSIS — R1900 Intra-abdominal and pelvic swelling, mass and lump, unspecified site: Secondary | ICD-10-CM | POA: Diagnosis not present

## 2021-09-21 DIAGNOSIS — N838 Other noninflammatory disorders of ovary, fallopian tube and broad ligament: Secondary | ICD-10-CM | POA: Diagnosis not present

## 2021-09-21 DIAGNOSIS — M109 Gout, unspecified: Secondary | ICD-10-CM | POA: Diagnosis not present

## 2021-09-21 DIAGNOSIS — N1831 Chronic kidney disease, stage 3a: Secondary | ICD-10-CM | POA: Diagnosis not present

## 2021-09-21 DIAGNOSIS — I129 Hypertensive chronic kidney disease with stage 1 through stage 4 chronic kidney disease, or unspecified chronic kidney disease: Secondary | ICD-10-CM | POA: Diagnosis not present

## 2021-09-21 DIAGNOSIS — E1122 Type 2 diabetes mellitus with diabetic chronic kidney disease: Secondary | ICD-10-CM | POA: Diagnosis not present

## 2021-09-25 ENCOUNTER — Encounter: Payer: Self-pay | Admitting: Licensed Clinical Social Worker

## 2021-09-25 ENCOUNTER — Telehealth: Payer: Self-pay | Admitting: Licensed Clinical Social Worker

## 2021-09-25 ENCOUNTER — Ambulatory Visit: Payer: Self-pay | Admitting: Licensed Clinical Social Worker

## 2021-09-25 DIAGNOSIS — I129 Hypertensive chronic kidney disease with stage 1 through stage 4 chronic kidney disease, or unspecified chronic kidney disease: Secondary | ICD-10-CM | POA: Diagnosis not present

## 2021-09-25 DIAGNOSIS — Z1379 Encounter for other screening for genetic and chromosomal anomalies: Secondary | ICD-10-CM

## 2021-09-25 DIAGNOSIS — E1122 Type 2 diabetes mellitus with diabetic chronic kidney disease: Secondary | ICD-10-CM | POA: Diagnosis not present

## 2021-09-25 DIAGNOSIS — R1900 Intra-abdominal and pelvic swelling, mass and lump, unspecified site: Secondary | ICD-10-CM | POA: Diagnosis not present

## 2021-09-25 DIAGNOSIS — M109 Gout, unspecified: Secondary | ICD-10-CM | POA: Diagnosis not present

## 2021-09-25 DIAGNOSIS — N838 Other noninflammatory disorders of ovary, fallopian tube and broad ligament: Secondary | ICD-10-CM | POA: Diagnosis not present

## 2021-09-25 DIAGNOSIS — N1831 Chronic kidney disease, stage 3a: Secondary | ICD-10-CM | POA: Diagnosis not present

## 2021-09-25 NOTE — Telephone Encounter (Signed)
Revealed negative genetic testing.  This normal result is reassuring.  It is unlikely that there is an increased risk of cancer due to a mutation in one of these genes.  However, genetic testing is not perfect, and cannot definitively rule out a hereditary cause.  It will be important for her to keep in contact with genetics to learn if any additional testing may be needed in the future.      

## 2021-09-25 NOTE — Progress Notes (Signed)
HPI:  [name] was previously seen in the [place] clinic due to a family history of cancer and concerns regarding a hereditary predisposition to cancer. Please refer to our prior cancer genetics clinic note for more information regarding our discussion, assessment and recommendations, at the time. [name]'s recent genetic test results were disclosed to her, as were recommendations warranted by these results. These results and recommendations are discussed in more detail below.  CANCER HISTORY:  Oncology History   No history exists.    FAMILY HISTORY:  We obtained a detailed, 4-generation family history.  Significant diagnoses are listed below: Family History  Problem Relation Age of Onset   Cancer Mother    Colon cancer Father    Colon cancer Paternal Grandfather    Breast cancer Neg Hx    Ovarian cancer Neg Hx    Endometrial cancer Neg Hx    Pancreatic cancer Neg Hx    Prostate cancer Neg Hx     [name] has a son, 57, and a daughter, 73, no history of cancer. She has 2 sisters, 1 brother. Her sister has had melanoma at least twice, and her brother has had melanoma once. Her other sister had had other types of skin cancer.    [name]'s mother had cancer in a colon polyp in her late 22s, and metastatic cancer in her late 79s that was likely a lung primary. Patient had 1 full maternal aunt, no cancers. She had two half uncles, 1 half aunt, limited information. Maternal grandfather had unknown type of cancer and died over age 11, grandmother died under age 56.    [name]'s father was diagnosed with colon cancer in his 36s and died at 69. Patient had 2 paternal aunts, no cancers for them or for her cousins. Paternal grandmother died under age 31, not cancer related. Grandfather had colon cancer in his 42s and died in his 80s.    [name] is unaware of previous family history of genetic testing for hereditary cancer risks. Patient's  maternal ancestors are of English/Scottish descent, and paternal ancestors are of English/Scottish descent. There is no reported Ashkenazi Jewish ancestry. There is no known consanguinity.     GENETIC TEST RESULTS: Genetic testing reported out on 09/24/2021 through the [facility] Multi-Cancer+RNA cancer panel found no pathogenic mutations.   The Multi-Cancer Panel + RNA offered by [facility] includes sequencing and/or deletion duplication testing of the following 84 genes: AIP, ALK, APC, ATM, AXIN2,BAP1,  BARD1, BLM, BMPR1A, BRCA1, BRCA2, BRIP1, CASR, CDC73, CDH1, CDK4, CDKN1B, CDKN1C, CDKN2A (p14ARF), CDKN2A (p16INK4a), CEBPA, CHEK2, CTNNA1, DICER1, DIS3L2, EGFR (c.2369C>T, p.Thr790Met variant only), EPCAM (Deletion/duplication testing only), FH, FLCN, GATA2, GPC3, GREM1 (Promoter region deletion/duplication testing only), HOXB13 (c.251G>A, p.Gly84Glu), HRAS, KIT, MAX, MEN1, MET, MITF (c.952G>A, p.Glu318Lys variant only), MLH1, MSH2, MSH3, MSH6, MUTYH, NBN, NF1, NF2, NTHL1, PALB2, PDGFRA, PHOX2B, PMS2, POLD1, POLE, POT1, PRKAR1A, PTCH1, PTEN, RAD50, RAD51C, RAD51D, RB1, RECQL4, RET, RUNX1, SDHAF2, SDHA (sequence changes only), SDHB, SDHC, SDHD, SMAD4, SMARCA4, SMARCB1, SMARCE1, STK11, SUFU, TERC, TERT, TMEM127, TP53, TSC1, TSC2, VHL, WRN and WT1.   The test report has been scanned into EPIC and is located under the Molecular Pathology section of the Results Review tab.  A portion of the result report is included below for reference.     We discussed that because current genetic testing is not perfect, it is possible there may be a gene mutation in one of these genes that current testing cannot detect, but that  chance is small.  There could be another gene that has not yet been discovered, or that we have not yet tested, that is responsible for the cancer diagnoses in the family. It is also possible there is a hereditary cause for the cancer in the family that [name] did not inherit and therefore was not  identified in her testing.  Therefore, it is important to remain in touch with cancer genetics in the future so that we can continue to offer [name] the most up to date genetic testing.   ADDITIONAL GENETIC TESTING: We discussed with [name] that her genetic testing was fairly extensive.  If there are genes identified to increase cancer risk that can be analyzed in the future, we would be happy to discuss and coordinate this testing at that time.    CANCER SCREENING RECOMMENDATIONS: [name]'s test result is considered negative (normal).  This means that we have not identified a hereditary cause for her family history of cancer at this time.   While reassuring, this does not definitively rule out a hereditary predisposition to cancer. It is still possible that there could be genetic mutations that are undetectable by current technology. There could be genetic mutations in genes that have not been tested or identified to increase cancer risk.  Therefore, it is recommended she continue to follow the cancer management and screening guidelines provided by her primary healthcare provider.   An individual's cancer risk and medical management are not determined by genetic test results alone. Overall cancer risk assessment incorporates additional factors, including personal medical history, family history, and any available genetic information that may result in a personalized plan for cancer prevention and surveillance.  RECOMMENDATIONS FOR FAMILY MEMBERS:  Relatives in this family might be at some increased risk of developing cancer, over the general population risk, simply due to the family history of cancer.  We recommended female relatives in this family have a yearly mammogram beginning at age 76, or 63 years younger than the earliest onset of cancer, an annual clinical breast exam, and perform monthly breast self-exams. Female relatives in this family should also have a gynecological  exam as recommended by their primary provider.  All family members should be referred for colonoscopy starting at age 61.    It is also possible there is a hereditary cause for the cancer in [name]'s family that she did not inherit and therefore was not identified in her.  Based on [name]'s family history, we recommended her sisters who have had melanoma have genetic counseling and testing. [name] will let us know if we can be of any assistance in coordinating genetic counseling and/or testing for these family members.  FOLLOW-UP: Lastly, we discussed with [name] that cancer genetics is a rapidly advancing field and it is possible that new genetic tests will be appropriate for her and/or her family members in the future. We encouraged her to remain in contact with cancer genetics on an annual basis so we can update her personal and family histories and let her know of advances in cancer genetics that may benefit this family.   Our contact number was provided. [name]'s questions were answered to her satisfaction, and she knows she is welcome to call us at anytime with additional questions or concerns.   Faith Rogue, MS, [facility] Genetic Counselor [email or web address] Phone: [phone]

## 2021-11-15 DIAGNOSIS — N1831 Chronic kidney disease, stage 3a: Secondary | ICD-10-CM | POA: Diagnosis not present

## 2021-11-15 DIAGNOSIS — E11319 Type 2 diabetes mellitus with unspecified diabetic retinopathy without macular edema: Secondary | ICD-10-CM | POA: Diagnosis not present

## 2021-11-15 DIAGNOSIS — R970 Elevated carcinoembryonic antigen [CEA]: Secondary | ICD-10-CM | POA: Diagnosis not present

## 2021-11-15 DIAGNOSIS — E1122 Type 2 diabetes mellitus with diabetic chronic kidney disease: Secondary | ICD-10-CM | POA: Diagnosis not present

## 2021-11-15 DIAGNOSIS — I129 Hypertensive chronic kidney disease with stage 1 through stage 4 chronic kidney disease, or unspecified chronic kidney disease: Secondary | ICD-10-CM | POA: Diagnosis not present

## 2021-11-15 DIAGNOSIS — E1139 Type 2 diabetes mellitus with other diabetic ophthalmic complication: Secondary | ICD-10-CM | POA: Diagnosis not present

## 2021-11-15 DIAGNOSIS — N838 Other noninflammatory disorders of ovary, fallopian tube and broad ligament: Secondary | ICD-10-CM | POA: Diagnosis not present

## 2021-12-13 ENCOUNTER — Ambulatory Visit (HOSPITAL_COMMUNITY)
Admission: RE | Admit: 2021-12-13 | Discharge: 2021-12-13 | Disposition: A | Discharge status: Home / Self Care | Payer: Medicare Other | Source: Ambulatory Visit | Attending: Gynecologic Oncology | Admitting: Gynecologic Oncology

## 2021-12-13 DIAGNOSIS — H35372 Puckering of macula, left eye: Secondary | ICD-10-CM | POA: Diagnosis not present

## 2021-12-13 DIAGNOSIS — D3131 Benign neoplasm of right choroid: Secondary | ICD-10-CM | POA: Diagnosis not present

## 2021-12-13 DIAGNOSIS — N888 Other specified noninflammatory disorders of cervix uteri: Secondary | ICD-10-CM | POA: Diagnosis not present

## 2021-12-13 DIAGNOSIS — N9489 Other specified conditions associated with female genital organs and menstrual cycle: Secondary | ICD-10-CM | POA: Insufficient documentation

## 2021-12-13 DIAGNOSIS — H40013 Open angle with borderline findings, low risk, bilateral: Secondary | ICD-10-CM | POA: Diagnosis not present

## 2021-12-13 DIAGNOSIS — R1909 Other intra-abdominal and pelvic swelling, mass and lump: Secondary | ICD-10-CM | POA: Diagnosis not present

## 2021-12-13 DIAGNOSIS — K5730 Diverticulosis of large intestine without perforation or abscess without bleeding: Secondary | ICD-10-CM | POA: Diagnosis not present

## 2021-12-13 DIAGNOSIS — E119 Type 2 diabetes mellitus without complications: Secondary | ICD-10-CM | POA: Diagnosis not present

## 2021-12-13 MED ORDER — GADOBUTROL 1 MMOL/ML IV SOLN
9.0000 mL | Freq: Once | INTRAVENOUS | Status: AC | PRN
  Administered 2021-12-13: 9 mL via INTRAVENOUS

## 2021-12-14 ENCOUNTER — Encounter: Payer: Self-pay | Admitting: Gynecologic Oncology

## 2021-12-21 ENCOUNTER — Telehealth: Payer: Self-pay

## 2021-12-21 DIAGNOSIS — R1900 Intra-abdominal and pelvic swelling, mass and lump, unspecified site: Secondary | ICD-10-CM

## 2021-12-21 NOTE — Telephone Encounter (Signed)
MR Pelvis scheduled for 06/22/22 at 9 AM; pt to arrive at 8:30 to [facility]. Should be NPO for 4 hours prior to exam. Sent to Nathaneil Canary for PA.  ?

## 2021-12-21 NOTE — Telephone Encounter (Signed)
-----   Message from Donnamae Jude, MD sent at 12/20/2021  5:11 PM EDT ----- ?I am happy to see her--I think I did her initial consult in the office. ?Office staff, please add her to the list for appointment in 6 months with MRI ? ?----- Message ----- ?From: [name], MD ?Sent: 12/19/2021   8:46 AM EDT ?To: Aletha Halim, MD, Donnamae Jude, MD ? ?Hi Drs. Ilda Basset and Kennon Rounds, ?You both saw this patient previously. She did not end up having endometrial pathology on D&C. Her preference is to avoid surgery which I think is reasonable given appearance and stability of adnexal/uterine masses. Would either of you be willing to see her for follow-up? I think getting an ultrasound or MRI in 6-9 months to assure stable size and appearance of the masses would be reasonable.  ?Thanks! ?Jade Sanchez ? ?

## 2022-01-04 ENCOUNTER — Ambulatory Visit
Admission: RE | Admit: 2022-01-04 | Discharge: 2022-01-04 | Disposition: A | Discharge status: Home / Self Care | Payer: Medicare Other | Source: Ambulatory Visit | Attending: Internal Medicine | Admitting: Internal Medicine

## 2022-01-04 DIAGNOSIS — M810 Age-related osteoporosis without current pathological fracture: Secondary | ICD-10-CM

## 2022-01-04 DIAGNOSIS — Z780 Asymptomatic menopausal state: Secondary | ICD-10-CM | POA: Diagnosis not present

## 2022-01-04 DIAGNOSIS — M8589 Other specified disorders of bone density and structure, multiple sites: Secondary | ICD-10-CM | POA: Diagnosis not present

## 2022-04-10 ENCOUNTER — Other Ambulatory Visit: Payer: Self-pay | Admitting: Internal Medicine

## 2022-04-10 DIAGNOSIS — Z1231 Encounter for screening mammogram for malignant neoplasm of breast: Secondary | ICD-10-CM

## 2022-04-30 ENCOUNTER — Ambulatory Visit (INDEPENDENT_AMBULATORY_CARE_PROVIDER_SITE_OTHER): Payer: Medicare Other | Admitting: Podiatry

## 2022-04-30 DIAGNOSIS — B351 Tinea unguium: Secondary | ICD-10-CM | POA: Diagnosis not present

## 2022-04-30 DIAGNOSIS — M79675 Pain in left toe(s): Secondary | ICD-10-CM | POA: Diagnosis not present

## 2022-04-30 DIAGNOSIS — M79674 Pain in right toe(s): Secondary | ICD-10-CM

## 2022-04-30 MED ORDER — CICLOPIROX 8 % EX SOLN: Freq: Every day | CUTANEOUS | 0 refills | Status: DC

## 2022-04-30 NOTE — Patient Instructions (Signed)
We will start the anti-fungal, Penlac, to the nails daily.  I would also recommend starting UREA NAIL GEL to the toenail daily to help with the thickness If you notice any swelling, redness, drainage or signs of infection, please let me know.   If was nice to meet you today. If you have any questions or any further concerns, please feel fee to give me a call. You can call our office at [phone] or please feel fee to send me a message through [place].

## 2022-04-30 NOTE — Progress Notes (Signed)
nlacSubjective:   Patient ID: Jade Sanchez, female   DOB: 81 y.o.   MRN: [id number]   HPI Chief Complaint  Patient presents with   Nail Problem    Patient came in today for bilateral nail fungus, A1c- 6.7 BG- 124 ( 2 days ago), Nails are yellow and thick,  Which started 15+ years ago,Nail trim     81 year old female presents above concerns.  No swelling redness or drainage to the toenail sites.  No other concerns.   Review of Systems  All other systems reviewed and are negative.  Past Medical History:  Diagnosis Date   Arthritis    affects knees and hands   DM Type 2    diet controlled   Family history of colon cancer    Family history of melanoma    Gout attack 07/27/2021   left foot none since   Hyperlipidemia    Hyperparathyroidism ([place])    no meds taken monitored q 6 months by pcp   Hypertension    Thickened endometrium     Past Surgical History:  Procedure Laterality Date   BREAST BIOPSY Left    benign Ama N/A 08/29/2021   Procedure: DILATATION & CURETTAGE/HYSTEROSCOPY WITH MYOSURE;  Surgeon: [name], MD;  Location: [facility] [place];  Service: Gynecology;  Laterality: N/A;   ENDOMETRIAL BIOPSY  08/02/2021   ESOPHAGOGASTRODUODENOSCOPY (EGD) WITH PROPOFOL N/A 07/21/2021   Procedure: ESOPHAGOGASTRODUODENOSCOPY (EGD) WITH PROPOFOL;  Surgeon: Clarene Essex, MD;  Location: [place];  Service: Endoscopy;  Laterality: N/A;   MENISCUS REPAIR Left    yrs ago per pt on 08-23-2021   TUBAL LIGATION     late 30's     Current Outpatient Medications:    ciclopirox (PENLAC) 8 % solution, Apply topically at bedtime. Apply over nail and surrounding skin. Apply daily over previous coat. After seven (7) days, may remove with alcohol and continue cycle., Disp: 6.6 mL, Rfl: 0   acetaminophen (TYLENOL) 500 MG tablet, Take 500 mg by mouth every 6 (six) hours as needed for moderate pain or headache.,  Disp: , Rfl:    furosemide (LASIX) 40 MG tablet, Take 40 mg by mouth daily., Disp: , Rfl:    potassium chloride (KLOR-CON) 10 MEQ tablet, Take 10 mEq by mouth daily., Disp: , Rfl:    rosuvastatin (CRESTOR) 5 MG tablet, Take 5 mg by mouth daily., Disp: , Rfl:    telmisartan (MICARDIS) 80 MG tablet, Take 80 mg by mouth daily., Disp: , Rfl:   No Known Allergies        Objective:  Physical Exam  General: AAO x3, NAD  Dermatological: Nails are hypertrophic, dystrophic, brittle, discolored, elongated 10. No surrounding redness or drainage. Tenderness nails 1-5 bilaterally. No open lesions or pre-ulcerative lesions are identified today.  Vascular: Dorsalis Pedis artery and Posterior Tibial artery pedal pulses are palpable bilateral with immedate capillary fill time.There is no pain with calf compression, swelling, warmth, erythema.   Neruologic: Grossly intact via light touch bilateral.   Musculoskeletal: No other areas of discomfort.  Gait: Unassisted, Nonantalgic.       Assessment:   Symptomatic onychomycosis     Plan:  -Treatment options discussed including all alternatives, risks, and complications -Etiology of symptoms were discussed -Nails debrided 10 without complications or bleeding. -Prescribed Penlac for nail fungus and discussed urea nail gel to help with the thickening. -Daily foot inspection -Follow-up in 3 months or sooner if any problems  arise. In the meantime, encouraged to call the office with any questions, concerns, change in symptoms.   Celesta Gentile, DPM

## 2022-05-03 DIAGNOSIS — N1831 Chronic kidney disease, stage 3a: Secondary | ICD-10-CM | POA: Diagnosis not present

## 2022-05-03 DIAGNOSIS — Z1331 Encounter for screening for depression: Secondary | ICD-10-CM | POA: Diagnosis not present

## 2022-05-03 DIAGNOSIS — E7800 Pure hypercholesterolemia, unspecified: Secondary | ICD-10-CM | POA: Diagnosis not present

## 2022-05-03 DIAGNOSIS — Z1239 Encounter for other screening for malignant neoplasm of breast: Secondary | ICD-10-CM | POA: Diagnosis not present

## 2022-05-03 DIAGNOSIS — E119 Type 2 diabetes mellitus without complications: Secondary | ICD-10-CM | POA: Diagnosis not present

## 2022-05-03 DIAGNOSIS — Z1211 Encounter for screening for malignant neoplasm of colon: Secondary | ICD-10-CM | POA: Diagnosis not present

## 2022-05-03 DIAGNOSIS — Z0000 Encounter for general adult medical examination without abnormal findings: Secondary | ICD-10-CM | POA: Diagnosis not present

## 2022-05-03 DIAGNOSIS — I10 Essential (primary) hypertension: Secondary | ICD-10-CM | POA: Diagnosis not present

## 2022-05-03 DIAGNOSIS — M810 Age-related osteoporosis without current pathological fracture: Secondary | ICD-10-CM | POA: Diagnosis not present

## 2022-05-11 ENCOUNTER — Ambulatory Visit
Admission: RE | Admit: 2022-05-11 | Discharge: 2022-05-11 | Disposition: A | Discharge status: Home / Self Care | Payer: Medicare Other | Source: Ambulatory Visit | Attending: Internal Medicine | Admitting: Internal Medicine

## 2022-05-11 DIAGNOSIS — Z1231 Encounter for screening mammogram for malignant neoplasm of breast: Secondary | ICD-10-CM

## 2022-05-15 ENCOUNTER — Encounter (INDEPENDENT_AMBULATORY_CARE_PROVIDER_SITE_OTHER): Payer: Medicare Other | Admitting: Ophthalmology

## 2022-05-15 DIAGNOSIS — E113293 Type 2 diabetes mellitus with mild nonproliferative diabetic retinopathy without macular edema, bilateral: Secondary | ICD-10-CM

## 2022-05-15 DIAGNOSIS — H35033 Hypertensive retinopathy, bilateral: Secondary | ICD-10-CM

## 2022-05-15 DIAGNOSIS — H43813 Vitreous degeneration, bilateral: Secondary | ICD-10-CM | POA: Diagnosis not present

## 2022-05-15 DIAGNOSIS — D3131 Benign neoplasm of right choroid: Secondary | ICD-10-CM

## 2022-05-15 DIAGNOSIS — H35372 Puckering of macula, left eye: Secondary | ICD-10-CM

## 2022-05-15 DIAGNOSIS — I10 Essential (primary) hypertension: Secondary | ICD-10-CM

## 2022-06-14 DIAGNOSIS — Z23 Encounter for immunization: Secondary | ICD-10-CM | POA: Diagnosis not present

## 2022-06-15 ENCOUNTER — Inpatient Hospital Stay: Payer: Medicare Other | Attending: Ophthalmology | Admitting: Licensed Clinical Social Worker

## 2022-06-15 DIAGNOSIS — Z800 Family history of malignant neoplasm of digestive organs: Secondary | ICD-10-CM

## 2022-06-15 NOTE — Progress Notes (Signed)
[place] Social Work  Patient presented to [facility]  to review and complete healthcare advance directives.  Clinical Social Worker met with patient.  The patient designated [name] (son) as their primary healthcare agent and [name] (daughter) as their secondary agent.  Patient also completed healthcare living will.    Documents were notarized and copies made for patient/family. Clinical Social Worker will send documents to medical records to be scanned into patient's chart. Clinical Social Worker encouraged patient/family to contact with any additional questions or concerns.   Henriette Combs, [place] Worker [facility]

## 2022-06-22 ENCOUNTER — Ambulatory Visit (HOSPITAL_COMMUNITY)
Admission: RE | Admit: 2022-06-22 | Discharge: 2022-06-22 | Disposition: A | Discharge status: Home / Self Care | Payer: Medicare Other | Source: Ambulatory Visit | Attending: Family Medicine | Admitting: Family Medicine

## 2022-06-22 DIAGNOSIS — R1900 Intra-abdominal and pelvic swelling, mass and lump, unspecified site: Secondary | ICD-10-CM | POA: Insufficient documentation

## 2022-06-22 DIAGNOSIS — N888 Other specified noninflammatory disorders of cervix uteri: Secondary | ICD-10-CM | POA: Diagnosis not present

## 2022-06-22 DIAGNOSIS — K5730 Diverticulosis of large intestine without perforation or abscess without bleeding: Secondary | ICD-10-CM | POA: Diagnosis not present

## 2022-06-22 MED ORDER — GADOBUTROL 1 MMOL/ML IV SOLN
10.0000 mL | Freq: Once | INTRAVENOUS | Status: AC | PRN
  Administered 2022-06-22: 10 mL via INTRAVENOUS

## 2022-06-28 ENCOUNTER — Ambulatory Visit (INDEPENDENT_AMBULATORY_CARE_PROVIDER_SITE_OTHER): Payer: Medicare Other | Admitting: Family Medicine

## 2022-06-28 ENCOUNTER — Encounter: Payer: Self-pay | Admitting: Family Medicine

## 2022-06-28 VITALS — BP 121/72 | HR 71 | Wt 245.1 lb

## 2022-06-28 DIAGNOSIS — N9489 Other specified conditions associated with female genital organs and menstrual cycle: Secondary | ICD-10-CM | POA: Diagnosis not present

## 2022-06-28 NOTE — Assessment & Plan Note (Signed)
Stable on MRI-results reviewed at length-will repeat in 6 months and if stable, consider yearly exams.--discussed at length with pt.

## 2022-06-28 NOTE — Progress Notes (Signed)
Subjective:    Patient ID: Jade Sanchez is a 81 y.o. female presenting with No chief complaint on file.  on 06/28/2022  HPI: Here today for f/u. She has bilateral adnexal masses. These have been stable over the last 1 year. S/p recent MRI.   Review of Systems  Constitutional:  Negative for chills and fever.  Respiratory:  Negative for shortness of breath.   Cardiovascular:  Negative for chest pain.  Gastrointestinal:  Negative for abdominal pain, nausea and vomiting.  Genitourinary:  Negative for dysuria.  Skin:  Negative for rash.      Objective:    BP 121/72   Pulse 71   Wt 245 lb 1.6 oz (111.2 kg)   BMI 38.39 kg/m  Physical Exam Exam conducted with a chaperone present.  Constitutional:      General: She is not in acute distress.    Appearance: She is well-developed.  HENT:     Head: Normocephalic and atraumatic.  Eyes:     General: No scleral icterus. Cardiovascular:     Rate and Rhythm: Normal rate.  Pulmonary:     Effort: Pulmonary effort is normal.  Abdominal:     Palpations: Abdomen is soft.  Musculoskeletal:     Cervical back: Neck supple.  Skin:    General: Skin is warm and dry.  Neurological:     Mental Status: She is alert and oriented to person, place, and time.    MR PELVIS W WO CONTRAST  Result Date: 06/25/2022 CLINICAL DATA:  Follow-up pelvic mass. EXAM: MRI PELVIS WITHOUT AND WITH CONTRAST TECHNIQUE: Multiplanar multisequence MR imaging of the pelvis was performed both before and after administration of intravenous contrast. CONTRAST:  24m GADAVIST GADOBUTROL 1 MMOL/ML IV SOLN COMPARISON:  Multiple priors including CT July 17, 2021, pelvic ultrasound July 17, 2021, and MRI pelvis July 24, 2021 and December 13, 2021. FINDINGS: Urinary Tract: Urinary bladder is unremarkable for degree of distension. Bowel: Colonic diverticulosis without findings of acute diverticulitis. Vascular/Lymphatic: No pathologically enlarged lymph nodes  or other significant abnormality. Reproductive: Uterus: Measures 7.3 x 3.1 x 4.3 cm the endometrial stripe measures 4 mm. The junctional zone measures 5 mm. Nabothian cysts in the cervix. Solid heterogeneous hypoenhancing lesion extending from the left adnexa into the pelvis posterior to the uterus measures 8.5 x 5.8 cm on image 41/19, previously 8 6 x 5.5 cm when remeasured for consistency. Thin walled well-circumscribed fluid intensity nonenhancing lesion adjacent to the above mass in the right posterior pelvis measures 5.5 x 4.1 cm on image 18/3 previously 5.8 x 4.1 cm, with a probable tubal component on image 12/4. Other: No significant abdominal free fluid. Musculoskeletal:  Unremarkable. IMPRESSION: 1. Stable 8.5 x 5.8 cm solid heterogeneous hypoenhancing lesion extending from the left adnexa into the pelvis posterior to the uterus again nonspecific with differential considerations including ovarian fibrothecoma or pedunculated broad ligament fibroid. 2. Stable thin walled 5.5 x 4.1 cm cystic lesion adjacent to the above mass in the right posterior pelvis which may extend from the right adnexa with a possible tubal component, no suspicious MRI features but nonspecific with primary differential considerations including an ovarian cyst, paraovarian cyst or less likely peritoneal inclusion cyst. 3. Colonic diverticulosis without findings of acute diverticulitis. Electronically Signed   By: JDahlia BailiffM.D.   On: 06/25/2022 10:40        Assessment & Plan:   Problem List Items Addressed This Visit       Unprioritized  Adnexal mass - Primary    Stable on MRI-results reviewed at length-will repeat in 6 months and if stable, consider yearly exams.--discussed at length with pt.      Relevant Orders   MR PELVIS W WO CONTRAST     Return in about 6 months (around 12/27/2022) for a follow-up.  Donnamae Jude, MD 06/28/2022 11:24 AM

## 2022-08-03 DIAGNOSIS — J189 Pneumonia, unspecified organism: Secondary | ICD-10-CM | POA: Diagnosis not present

## 2022-08-03 DIAGNOSIS — Z20822 Contact with and (suspected) exposure to covid-19: Secondary | ICD-10-CM | POA: Diagnosis not present

## 2022-08-03 DIAGNOSIS — R062 Wheezing: Secondary | ICD-10-CM | POA: Diagnosis not present

## 2022-08-11 DIAGNOSIS — J069 Acute upper respiratory infection, unspecified: Secondary | ICD-10-CM | POA: Diagnosis not present

## 2022-08-11 DIAGNOSIS — Z20822 Contact with and (suspected) exposure to covid-19: Secondary | ICD-10-CM | POA: Diagnosis not present

## 2022-08-11 DIAGNOSIS — R062 Wheezing: Secondary | ICD-10-CM | POA: Diagnosis not present

## 2022-08-17 DIAGNOSIS — E559 Vitamin D deficiency, unspecified: Secondary | ICD-10-CM | POA: Diagnosis not present

## 2022-08-17 DIAGNOSIS — R7989 Other specified abnormal findings of blood chemistry: Secondary | ICD-10-CM | POA: Diagnosis not present

## 2022-11-01 DIAGNOSIS — R7989 Other specified abnormal findings of blood chemistry: Secondary | ICD-10-CM | POA: Diagnosis not present

## 2022-11-01 DIAGNOSIS — E119 Type 2 diabetes mellitus without complications: Secondary | ICD-10-CM | POA: Diagnosis not present

## 2022-11-01 DIAGNOSIS — E7800 Pure hypercholesterolemia, unspecified: Secondary | ICD-10-CM | POA: Diagnosis not present

## 2022-11-01 DIAGNOSIS — N1831 Chronic kidney disease, stage 3a: Secondary | ICD-10-CM | POA: Diagnosis not present

## 2022-11-01 DIAGNOSIS — M810 Age-related osteoporosis without current pathological fracture: Secondary | ICD-10-CM | POA: Diagnosis not present

## 2022-11-01 DIAGNOSIS — I10 Essential (primary) hypertension: Secondary | ICD-10-CM | POA: Diagnosis not present

## 2022-11-01 DIAGNOSIS — M542 Cervicalgia: Secondary | ICD-10-CM | POA: Diagnosis not present

## 2022-12-27 ENCOUNTER — Ambulatory Visit (HOSPITAL_COMMUNITY)
Admission: RE | Admit: 2022-12-27 | Discharge: 2022-12-27 | Disposition: A | Discharge status: Home / Self Care | Payer: Medicare Other | Source: Ambulatory Visit | Attending: Family Medicine | Admitting: Family Medicine

## 2022-12-27 DIAGNOSIS — N9489 Other specified conditions associated with female genital organs and menstrual cycle: Secondary | ICD-10-CM | POA: Diagnosis not present

## 2022-12-27 DIAGNOSIS — R1909 Other intra-abdominal and pelvic swelling, mass and lump: Secondary | ICD-10-CM | POA: Diagnosis not present

## 2022-12-27 MED ORDER — GADOBUTROL 1 MMOL/ML IV SOLN
10.0000 mL | Freq: Once | INTRAVENOUS | Status: AC | PRN
  Administered 2022-12-27: 10 mL via INTRAVENOUS

## 2022-12-31 ENCOUNTER — Encounter: Payer: Self-pay | Admitting: Family Medicine

## 2023-01-01 ENCOUNTER — Telehealth: Payer: Self-pay | Admitting: *Deleted

## 2023-01-01 NOTE — Telephone Encounter (Signed)
Called pt to discuss MRI results and Dr Shawnie Pons would like her to come in for discuss options and get lab work.

## 2023-01-01 NOTE — Telephone Encounter (Signed)
-----   Message from Reva Bores, MD sent at 12/31/2022  4:28 PM EDT ----- Book for for labs and discussion of next steps.

## 2023-01-16 ENCOUNTER — Encounter: Payer: Self-pay | Admitting: Family Medicine

## 2023-01-16 ENCOUNTER — Ambulatory Visit (INDEPENDENT_AMBULATORY_CARE_PROVIDER_SITE_OTHER): Payer: Medicare Other | Admitting: Family Medicine

## 2023-01-16 VITALS — BP 134/76 | HR 73 | Wt 243.0 lb

## 2023-01-16 DIAGNOSIS — N9489 Other specified conditions associated with female genital organs and menstrual cycle: Secondary | ICD-10-CM | POA: Diagnosis not present

## 2023-01-16 NOTE — Assessment & Plan Note (Signed)
Will repeat labs and if elevated refer to GYN/ONC. If normal can consider surgical removal or short interval f/u with MRI in 2-3 months. Patient was advised of risks of surgery as well especially given age.

## 2023-01-16 NOTE — Progress Notes (Signed)
CC: discuss MRI

## 2023-01-16 NOTE — Progress Notes (Signed)
    Subjective:    Patient ID: Jade Sanchez is a 82 y.o. female presenting with Gyn visit   on 01/16/2023  HPI: Here for follow-up. She has known left solid ovarian mass which has been stable and known simple right sided cyst. We have done serial MRIs and has had negative cancer markers in 2022. Last MRI showed enlarging right adnexal mass and change from unilocular to bilobed. Grew from 5.6 x 4.1 to 9.3 x 3.9.  Review of Systems  Constitutional:  Negative for chills and fever.  Respiratory:  Negative for shortness of breath.   Cardiovascular:  Negative for chest pain.  Gastrointestinal:  Negative for abdominal pain, nausea and vomiting.  Genitourinary:  Negative for dysuria.  Skin:  Negative for rash.      Objective:    BP 134/76   Pulse 73   Wt 243 lb (110.2 kg)   BMI 38.06 kg/m  Physical Exam Exam conducted with a chaperone present.  Constitutional:      General: She is not in acute distress.    Appearance: She is well-developed.  HENT:     Head: Normocephalic and atraumatic.  Eyes:     General: No scleral icterus. Cardiovascular:     Rate and Rhythm: Normal rate.  Pulmonary:     Effort: Pulmonary effort is normal.  Abdominal:     Palpations: Abdomen is soft.  Musculoskeletal:     Cervical back: Neck supple.  Skin:    General: Skin is warm and dry.  Neurological:     Mental Status: She is alert and oriented to person, place, and time.         Assessment & Plan:   Problem List Items Addressed This Visit       Unprioritized   Adnexal mass - Primary    Will repeat labs and if elevated refer to GYN/ONC. If normal can consider surgical removal or short interval f/u with MRI in 2-3 months. Patient was advised of risks of surgery as well especially given age.      Relevant Orders   Ovarian Malignancy Risk-ROMA    Return in about 3 months (around 04/18/2023) for a follow-up.  Reva Bores, MD 01/16/2023 9:14 AM

## 2023-01-17 LAB — OVARIAN MALIGNANCY RISK-ROMA
Cancer Antigen (CA) 125: 7.8 U/mL (ref 0.0–38.1)
HE4: 89.5 pmol/L (ref 0.0–96.9)
Postmenopausal ROMA: 1.29
Premenopausal ROMA: 2.36 — ABNORMAL HIGH

## 2023-01-17 LAB — PREMENOPAUSAL INTERP: HIGH

## 2023-01-17 LAB — POSTMENOPAUSAL INTERP: LOW

## 2023-01-17 NOTE — Progress Notes (Signed)
I think continued surveillance is reasonable. The right size may continue to grow and ultimately she may start having some symptoms. As long as she is ok with continued follow-up and that surgery may still be recommended.

## 2023-01-22 ENCOUNTER — Telehealth: Payer: Self-pay | Admitting: *Deleted

## 2023-01-22 DIAGNOSIS — N9489 Other specified conditions associated with female genital organs and menstrual cycle: Secondary | ICD-10-CM

## 2023-01-22 NOTE — Telephone Encounter (Signed)
Called to schedule MRI in 3 months for 03/28/28 at 10:00  [facility]. Sent message to front office to schedule follow up with provider after the MRI to review results and plan. I called Kana and notified her of MRI appointment. I also informed her front office will call her with appointment to follow up with Dr. Shawnie Pons after MRI. She voices understanding. Nancy Fetter

## 2023-01-22 NOTE — Telephone Encounter (Signed)
-----   Message from Reva Bores, MD sent at 01/17/2023  2:49 PM EDT ----- Repeat MRI in 3 months for ovarian mass. F/u in office after this.

## 2023-01-23 ENCOUNTER — Telehealth: Payer: Self-pay | Admitting: Family Medicine

## 2023-01-23 NOTE — Telephone Encounter (Signed)
Spoke with [name] about calling late July to schedule her appointment after her MRI on 08/09. She needs to see Dr. Shawnie Pons, but the schedule is not ready for August yet. She stated she would call to schedule.

## 2023-01-25 DIAGNOSIS — H40013 Open angle with borderline findings, low risk, bilateral: Secondary | ICD-10-CM | POA: Diagnosis not present

## 2023-01-25 DIAGNOSIS — H01001 Unspecified blepharitis right upper eyelid: Secondary | ICD-10-CM | POA: Diagnosis not present

## 2023-01-25 DIAGNOSIS — E119 Type 2 diabetes mellitus without complications: Secondary | ICD-10-CM | POA: Diagnosis not present

## 2023-01-25 DIAGNOSIS — H35372 Puckering of macula, left eye: Secondary | ICD-10-CM | POA: Diagnosis not present

## 2023-02-04 ENCOUNTER — Ambulatory Visit: Payer: Medicare Other | Admitting: Family Medicine

## 2023-03-29 ENCOUNTER — Ambulatory Visit (HOSPITAL_COMMUNITY)
Admission: RE | Admit: 2023-03-29 | Discharge: 2023-03-29 | Disposition: A | Discharge status: Home / Self Care | Payer: Medicare Other | Source: Ambulatory Visit | Attending: Family Medicine | Admitting: Family Medicine

## 2023-03-29 DIAGNOSIS — K5730 Diverticulosis of large intestine without perforation or abscess without bleeding: Secondary | ICD-10-CM | POA: Diagnosis not present

## 2023-03-29 DIAGNOSIS — N9489 Other specified conditions associated with female genital organs and menstrual cycle: Secondary | ICD-10-CM

## 2023-03-29 MED ORDER — GADOBUTROL 1 MMOL/ML IV SOLN
10.0000 mL | Freq: Once | INTRAVENOUS | Status: AC | PRN
  Administered 2023-03-29: 10 mL via INTRAVENOUS

## 2023-04-03 ENCOUNTER — Encounter: Payer: Self-pay | Admitting: Family Medicine

## 2023-04-05 ENCOUNTER — Encounter: Payer: Self-pay | Admitting: Family Medicine

## 2023-04-10 DIAGNOSIS — H01001 Unspecified blepharitis right upper eyelid: Secondary | ICD-10-CM | POA: Diagnosis not present

## 2023-04-10 DIAGNOSIS — B880 Other acariasis: Secondary | ICD-10-CM | POA: Diagnosis not present

## 2023-04-10 DIAGNOSIS — H01004 Unspecified blepharitis left upper eyelid: Secondary | ICD-10-CM | POA: Diagnosis not present

## 2023-04-12 ENCOUNTER — Telehealth: Payer: Self-pay

## 2023-04-12 ENCOUNTER — Other Ambulatory Visit: Payer: Self-pay | Admitting: *Deleted

## 2023-04-12 DIAGNOSIS — N83201 Unspecified ovarian cyst, right side: Secondary | ICD-10-CM

## 2023-04-12 NOTE — Telephone Encounter (Signed)
Called to schedule pt MRI, conference in scheduler to go over pre testing questions. Pt asked questions and understood appt day/time.

## 2023-04-17 ENCOUNTER — Other Ambulatory Visit: Payer: Self-pay | Admitting: Internal Medicine

## 2023-04-17 DIAGNOSIS — Z0000 Encounter for general adult medical examination without abnormal findings: Secondary | ICD-10-CM

## 2023-04-21 DIAGNOSIS — U071 COVID-19: Secondary | ICD-10-CM | POA: Diagnosis not present

## 2023-04-21 DIAGNOSIS — J189 Pneumonia, unspecified organism: Secondary | ICD-10-CM | POA: Diagnosis not present

## 2023-04-21 DIAGNOSIS — I10 Essential (primary) hypertension: Secondary | ICD-10-CM | POA: Diagnosis not present

## 2023-05-13 ENCOUNTER — Ambulatory Visit
Admission: RE | Admit: 2023-05-13 | Discharge: 2023-05-13 | Disposition: A | Discharge status: Home / Self Care | Payer: Medicare Other | Source: Ambulatory Visit | Attending: Internal Medicine | Admitting: Internal Medicine

## 2023-05-13 DIAGNOSIS — Z0000 Encounter for general adult medical examination without abnormal findings: Secondary | ICD-10-CM

## 2023-05-13 DIAGNOSIS — Z1231 Encounter for screening mammogram for malignant neoplasm of breast: Secondary | ICD-10-CM | POA: Diagnosis not present

## 2023-05-16 IMAGING — US US AXILLARY LEFT
1 series · 3 of 3 positions shown · non-contrast
Comparison: Previous exam(s).

CLINICAL DATA: 80-year-old female presenting as a recall from
screening for possible left axillary mass. Patient reports she had a
COVID booster vaccination just prior to her mammogram.

EXAM:
ULTRASOUND OF THE LEFT AXILLA

[Series 1: us axillary left · 0.09mm/px · 3 of 3 slices shown]
[im 1/3]
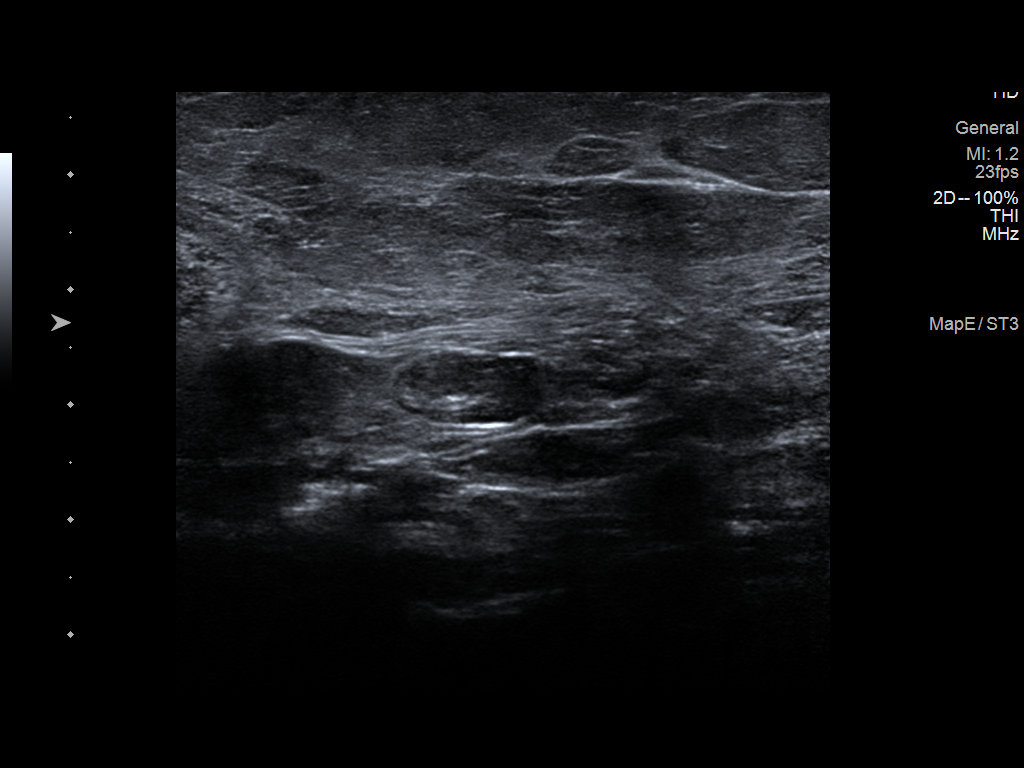
[im 2/3]
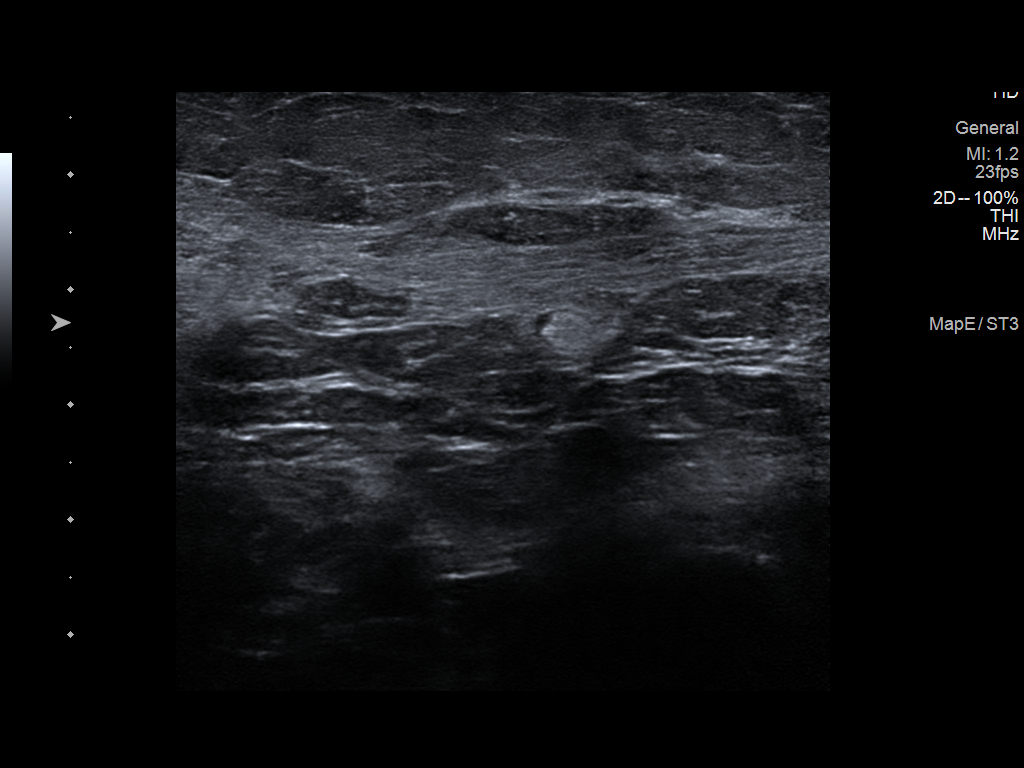
[im 3/3]
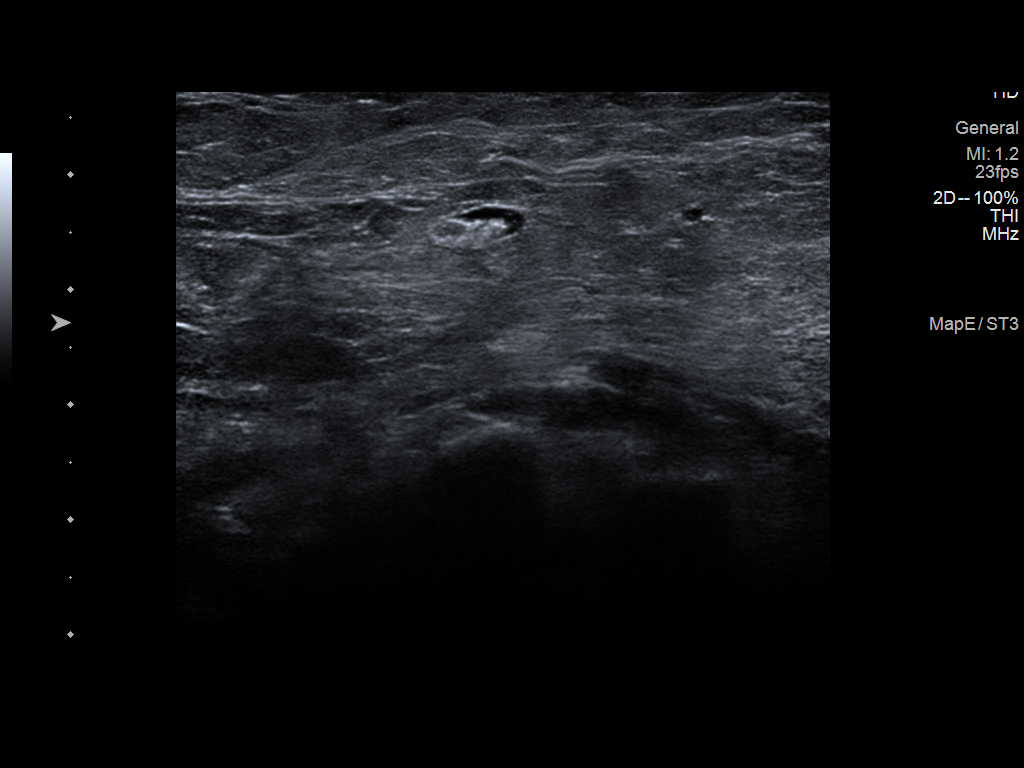

[3 of 3 positions shown; findings below may reference images not displayed]

FINDINGS: Ultrasound is performed throughout the left axilla demonstrating
multiple lymph nodes with normal morphology and cortical thickness.
There is no abnormal lymph node or suspicious solid mass.
IMPRESSION: Normal left axillary ultrasound.

RECOMMENDATION:
Return to routine annual screening mammography which will be due in
April 2022.

I have discussed the findings and recommendations with the patient.
If applicable, a reminder letter will be sent to the patient
regarding the next appointment.

BI-RADS CATEGORY  2: Benign.

## 2023-05-21 ENCOUNTER — Encounter (INDEPENDENT_AMBULATORY_CARE_PROVIDER_SITE_OTHER): Payer: Medicare Other | Admitting: Ophthalmology

## 2023-05-21 DIAGNOSIS — Z7984 Long term (current) use of oral hypoglycemic drugs: Secondary | ICD-10-CM | POA: Diagnosis not present

## 2023-05-21 DIAGNOSIS — E113293 Type 2 diabetes mellitus with mild nonproliferative diabetic retinopathy without macular edema, bilateral: Secondary | ICD-10-CM | POA: Diagnosis not present

## 2023-05-21 DIAGNOSIS — H35372 Puckering of macula, left eye: Secondary | ICD-10-CM | POA: Diagnosis not present

## 2023-05-21 DIAGNOSIS — H43813 Vitreous degeneration, bilateral: Secondary | ICD-10-CM

## 2023-05-21 DIAGNOSIS — I10 Essential (primary) hypertension: Secondary | ICD-10-CM | POA: Diagnosis not present

## 2023-05-21 DIAGNOSIS — H35033 Hypertensive retinopathy, bilateral: Secondary | ICD-10-CM

## 2023-05-21 DIAGNOSIS — D3131 Benign neoplasm of right choroid: Secondary | ICD-10-CM

## 2023-05-30 DIAGNOSIS — Z23 Encounter for immunization: Secondary | ICD-10-CM | POA: Diagnosis not present

## 2023-05-30 DIAGNOSIS — Z1331 Encounter for screening for depression: Secondary | ICD-10-CM | POA: Diagnosis not present

## 2023-05-30 DIAGNOSIS — Z0000 Encounter for general adult medical examination without abnormal findings: Secondary | ICD-10-CM | POA: Diagnosis not present

## 2023-08-12 DIAGNOSIS — E7800 Pure hypercholesterolemia, unspecified: Secondary | ICD-10-CM | POA: Diagnosis not present

## 2023-08-12 DIAGNOSIS — Z23 Encounter for immunization: Secondary | ICD-10-CM | POA: Diagnosis not present

## 2023-08-12 DIAGNOSIS — N1831 Chronic kidney disease, stage 3a: Secondary | ICD-10-CM | POA: Diagnosis not present

## 2023-08-12 DIAGNOSIS — I10 Essential (primary) hypertension: Secondary | ICD-10-CM | POA: Diagnosis not present

## 2023-08-12 DIAGNOSIS — R600 Localized edema: Secondary | ICD-10-CM | POA: Diagnosis not present

## 2023-08-12 DIAGNOSIS — M810 Age-related osteoporosis without current pathological fracture: Secondary | ICD-10-CM | POA: Diagnosis not present

## 2023-08-12 DIAGNOSIS — E559 Vitamin D deficiency, unspecified: Secondary | ICD-10-CM | POA: Diagnosis not present

## 2023-08-12 DIAGNOSIS — Z79899 Other long term (current) drug therapy: Secondary | ICD-10-CM | POA: Diagnosis not present

## 2023-08-12 DIAGNOSIS — R7989 Other specified abnormal findings of blood chemistry: Secondary | ICD-10-CM | POA: Diagnosis not present

## 2023-08-12 DIAGNOSIS — E119 Type 2 diabetes mellitus without complications: Secondary | ICD-10-CM | POA: Diagnosis not present

## 2023-10-08 ENCOUNTER — Ambulatory Visit (HOSPITAL_COMMUNITY)
Admission: RE | Admit: 2023-10-08 | Discharge: 2023-10-08 | Disposition: A | Discharge status: Home / Self Care | Payer: Medicare Other | Source: Ambulatory Visit | Attending: Family Medicine | Admitting: Family Medicine

## 2023-10-08 DIAGNOSIS — N83202 Unspecified ovarian cyst, left side: Secondary | ICD-10-CM | POA: Diagnosis present

## 2023-10-08 DIAGNOSIS — N83201 Unspecified ovarian cyst, right side: Secondary | ICD-10-CM | POA: Insufficient documentation

## 2023-10-08 MED ORDER — GADOBUTROL 1 MMOL/ML IV SOLN
7.0000 mL | Freq: Once | INTRAVENOUS | Status: AC | PRN
  Administered 2023-10-08: 10 mL via INTRAVENOUS

## 2023-10-09 ENCOUNTER — Ambulatory Visit (HOSPITAL_COMMUNITY): Admission: RE | Admit: 2023-10-09 | Payer: Medicare Other | Source: Ambulatory Visit

## 2023-10-21 ENCOUNTER — Encounter: Payer: Self-pay | Admitting: Family Medicine

## 2023-10-22 ENCOUNTER — Encounter: Payer: Self-pay | Admitting: Family Medicine

## 2023-10-29 ENCOUNTER — Emergency Department (HOSPITAL_COMMUNITY)

## 2023-10-29 ENCOUNTER — Encounter (HOSPITAL_COMMUNITY): Payer: Self-pay | Admitting: Internal Medicine

## 2023-10-29 ENCOUNTER — Other Ambulatory Visit: Payer: Self-pay

## 2023-10-29 ENCOUNTER — Inpatient Hospital Stay (HOSPITAL_COMMUNITY)
Admission: EM | Admit: 2023-10-29 | Discharge: 2023-11-01 | DRG: 483 | Disposition: A | Discharge status: Home Health | Attending: Family Medicine | Admitting: Family Medicine

## 2023-10-29 DIAGNOSIS — Z87891 Personal history of nicotine dependence: Secondary | ICD-10-CM

## 2023-10-29 DIAGNOSIS — E1122 Type 2 diabetes mellitus with diabetic chronic kidney disease: Secondary | ICD-10-CM | POA: Diagnosis present

## 2023-10-29 DIAGNOSIS — E210 Primary hyperparathyroidism: Secondary | ICD-10-CM | POA: Diagnosis present

## 2023-10-29 DIAGNOSIS — E7800 Pure hypercholesterolemia, unspecified: Secondary | ICD-10-CM | POA: Diagnosis present

## 2023-10-29 DIAGNOSIS — Z79899 Other long term (current) drug therapy: Secondary | ICD-10-CM

## 2023-10-29 DIAGNOSIS — N1832 Chronic kidney disease, stage 3b: Secondary | ICD-10-CM | POA: Diagnosis present

## 2023-10-29 DIAGNOSIS — M65912 Unspecified synovitis and tenosynovitis, left shoulder: Secondary | ICD-10-CM | POA: Diagnosis present

## 2023-10-29 DIAGNOSIS — E119 Type 2 diabetes mellitus without complications: Secondary | ICD-10-CM | POA: Diagnosis present

## 2023-10-29 DIAGNOSIS — E669 Obesity, unspecified: Secondary | ICD-10-CM | POA: Diagnosis present

## 2023-10-29 DIAGNOSIS — N179 Acute kidney failure, unspecified: Secondary | ICD-10-CM | POA: Clinically undetermined

## 2023-10-29 DIAGNOSIS — W1809XA Striking against other object with subsequent fall, initial encounter: Secondary | ICD-10-CM | POA: Diagnosis present

## 2023-10-29 DIAGNOSIS — Y92210 Daycare center as the place of occurrence of the external cause: Secondary | ICD-10-CM

## 2023-10-29 DIAGNOSIS — S42202P Unspecified fracture of upper end of left humerus, subsequent encounter for fracture with malunion: Secondary | ICD-10-CM | POA: Diagnosis not present

## 2023-10-29 DIAGNOSIS — G8911 Acute pain due to trauma: Principal | ICD-10-CM

## 2023-10-29 DIAGNOSIS — Z6836 Body mass index (BMI) 36.0-36.9, adult: Secondary | ICD-10-CM

## 2023-10-29 DIAGNOSIS — W19XXXA Unspecified fall, initial encounter: Secondary | ICD-10-CM | POA: Diagnosis present

## 2023-10-29 DIAGNOSIS — S42202A Unspecified fracture of upper end of left humerus, initial encounter for closed fracture: Secondary | ICD-10-CM | POA: Diagnosis not present

## 2023-10-29 DIAGNOSIS — I129 Hypertensive chronic kidney disease with stage 1 through stage 4 chronic kidney disease, or unspecified chronic kidney disease: Secondary | ICD-10-CM | POA: Diagnosis present

## 2023-10-29 DIAGNOSIS — Y9301 Activity, walking, marching and hiking: Secondary | ICD-10-CM | POA: Diagnosis present

## 2023-10-29 DIAGNOSIS — S62502A Fracture of unspecified phalanx of left thumb, initial encounter for closed fracture: Secondary | ICD-10-CM | POA: Diagnosis present

## 2023-10-29 DIAGNOSIS — M25512 Pain in left shoulder: Secondary | ICD-10-CM | POA: Diagnosis not present

## 2023-10-29 DIAGNOSIS — M1990 Unspecified osteoarthritis, unspecified site: Secondary | ICD-10-CM | POA: Diagnosis present

## 2023-10-29 DIAGNOSIS — S62509A Fracture of unspecified phalanx of unspecified thumb, initial encounter for closed fracture: Secondary | ICD-10-CM | POA: Diagnosis present

## 2023-10-29 DIAGNOSIS — I10 Essential (primary) hypertension: Secondary | ICD-10-CM | POA: Diagnosis present

## 2023-10-29 DIAGNOSIS — S42242A 4-part fracture of surgical neck of left humerus, initial encounter for closed fracture: Principal | ICD-10-CM | POA: Diagnosis present

## 2023-10-29 DIAGNOSIS — S42209A Unspecified fracture of upper end of unspecified humerus, initial encounter for closed fracture: Secondary | ICD-10-CM | POA: Diagnosis present

## 2023-10-29 LAB — I-STAT CHEM 8, ED
BUN: 18 mg/dL (ref 8–23)
Calcium, Ion: 1.25 mmol/L (ref 1.15–1.40)
Chloride: 108 mmol/L (ref 98–111)
Creatinine, Ser: 1.2 mg/dL — ABNORMAL HIGH (ref 0.44–1.00)
Glucose, Bld: 151 mg/dL — ABNORMAL HIGH (ref 70–99)
HCT: 41 % (ref 36.0–46.0)
Hemoglobin: 13.9 g/dL (ref 12.0–15.0)
Potassium: 4.1 mmol/L (ref 3.5–5.1)
Sodium: 136 mmol/L (ref 135–145)
TCO2: 20 mmol/L — ABNORMAL LOW (ref 22–32)

## 2023-10-29 LAB — CBC WITH DIFFERENTIAL/PLATELET
Abs Immature Granulocytes: 0.06 10*3/uL (ref 0.00–0.07)
Basophils Absolute: 0 10*3/uL (ref 0.0–0.1)
Basophils Relative: 0 %
Eosinophils Absolute: 0.2 10*3/uL (ref 0.0–0.5)
Eosinophils Relative: 2 %
HCT: 42.5 % (ref 36.0–46.0)
Hemoglobin: 14.5 g/dL (ref 12.0–15.0)
Immature Granulocytes: 1 %
Lymphocytes Relative: 28 %
Lymphs Abs: 2.5 10*3/uL (ref 0.7–4.0)
MCH: 30.7 pg (ref 26.0–34.0)
MCHC: 34.1 g/dL (ref 30.0–36.0)
MCV: 89.9 fL (ref 80.0–100.0)
Monocytes Absolute: 0.8 10*3/uL (ref 0.1–1.0)
Monocytes Relative: 8 %
Neutro Abs: 5.5 10*3/uL (ref 1.7–7.7)
Neutrophils Relative %: 61 %
Platelets: 297 10*3/uL (ref 150–400)
RBC: 4.73 MIL/uL (ref 3.87–5.11)
RDW: 13.1 % (ref 11.5–15.5)
WBC: 9.1 10*3/uL (ref 4.0–10.5)
nRBC: 0 % (ref 0.0–0.2)

## 2023-10-29 LAB — BASIC METABOLIC PANEL
Anion gap: 9 (ref 5–15)
BUN: 16 mg/dL (ref 8–23)
CO2: 20 mmol/L — ABNORMAL LOW (ref 22–32)
Calcium: 10.7 mg/dL — ABNORMAL HIGH (ref 8.9–10.3)
Chloride: 106 mmol/L (ref 98–111)
Creatinine, Ser: 1.18 mg/dL — ABNORMAL HIGH (ref 0.44–1.00)
GFR, Estimated: 47 mL/min — ABNORMAL LOW (ref >60)
Glucose, Bld: 150 mg/dL — ABNORMAL HIGH (ref 70–99)
Potassium: 4 mmol/L (ref 3.5–5.1)
Sodium: 135 mmol/L (ref 135–145)

## 2023-10-29 LAB — HEMOGLOBIN A1C
Hgb A1c MFr Bld: 7 % — ABNORMAL HIGH (ref 4.8–5.6)
Mean Plasma Glucose: 154.2 mg/dL

## 2023-10-29 LAB — CK: Total CK: 118 U/L (ref 38–234)

## 2023-10-29 MED ORDER — MORPHINE SULFATE (PF) 4 MG/ML IV SOLN
4.0000 mg | Freq: Once | INTRAVENOUS | Status: AC
  Administered 2023-10-29: 4 mg via INTRAVENOUS
  Filled 2023-10-29: qty 1

## 2023-10-29 MED ORDER — SODIUM CHLORIDE 0.9% FLUSH
3.0000 mL | Freq: Two times a day (BID) | INTRAVENOUS | Status: DC
  Administered 2023-10-29 – 2023-11-01 (×6): 3 mL via INTRAVENOUS

## 2023-10-29 MED ORDER — FENTANYL CITRATE PF 50 MCG/ML IJ SOSY
50.0000 ug | PREFILLED_SYRINGE | Freq: Once | INTRAMUSCULAR | Status: AC
  Administered 2023-10-29: 50 ug via INTRAVENOUS
  Filled 2023-10-29: qty 1

## 2023-10-29 MED ORDER — ACETAMINOPHEN 325 MG PO TABS: 650.0000 mg | ORAL_TABLET | Freq: Four times a day (QID) | ORAL | Status: DC | PRN

## 2023-10-29 MED ORDER — ALBUTEROL SULFATE (2.5 MG/3ML) 0.083% IN NEBU: 2.5000 mg | INHALATION_SOLUTION | Freq: Four times a day (QID) | RESPIRATORY_TRACT | Status: DC | PRN

## 2023-10-29 MED ORDER — MORPHINE SULFATE (PF) 2 MG/ML IV SOLN
2.0000 mg | Freq: Once | INTRAVENOUS | Status: AC
  Administered 2023-10-29: 2 mg via INTRAVENOUS
  Filled 2023-10-29: qty 1

## 2023-10-29 MED ORDER — HYDRALAZINE HCL 20 MG/ML IJ SOLN: 10.0000 mg | INTRAMUSCULAR | Status: DC | PRN

## 2023-10-29 MED ORDER — HYDROCODONE-ACETAMINOPHEN 5-325 MG PO TABS
1.0000 | ORAL_TABLET | Freq: Four times a day (QID) | ORAL | Status: DC | PRN
  Administered 2023-10-29 – 2023-10-30 (×3): 2 via ORAL
  Filled 2023-10-29 (×3): qty 2

## 2023-10-29 MED ORDER — MORPHINE SULFATE (PF) 2 MG/ML IV SOLN
2.0000 mg | INTRAVENOUS | Status: DC | PRN
  Administered 2023-10-29 – 2023-10-30 (×2): 2 mg via INTRAVENOUS
  Filled 2023-10-29 (×2): qty 1

## 2023-10-29 MED ORDER — ACETAMINOPHEN 650 MG RE SUPP: 650.0000 mg | Freq: Four times a day (QID) | RECTAL | Status: DC | PRN

## 2023-10-29 MED ORDER — ONDANSETRON HCL 4 MG/2ML IJ SOLN: 4.0000 mg | Freq: Four times a day (QID) | INTRAMUSCULAR | Status: DC | PRN

## 2023-10-29 MED ORDER — ONDANSETRON HCL 4 MG/2ML IJ SOLN
4.0000 mg | Freq: Once | INTRAMUSCULAR | Status: AC
  Administered 2023-10-29: 4 mg via INTRAVENOUS
  Filled 2023-10-29: qty 2

## 2023-10-29 MED ORDER — SODIUM CHLORIDE 0.9 % IV SOLN: INTRAVENOUS | Status: AC

## 2023-10-29 MED ORDER — ONDANSETRON HCL 4 MG PO TABS: 4.0000 mg | ORAL_TABLET | Freq: Four times a day (QID) | ORAL | Status: DC | PRN

## 2023-10-29 MED ORDER — INSULIN ASPART 100 UNIT/ML IJ SOLN: 0.0000 [IU] | Freq: Three times a day (TID) | INTRAMUSCULAR | Status: DC

## 2023-10-29 MED ORDER — FENTANYL CITRATE PF 50 MCG/ML IJ SOSY: 25.0000 ug | PREFILLED_SYRINGE | Freq: Once | INTRAMUSCULAR | Status: DC

## 2023-10-29 MED ORDER — ENOXAPARIN SODIUM 40 MG/0.4ML IJ SOSY: 40.0000 mg | PREFILLED_SYRINGE | INTRAMUSCULAR | Status: DC

## 2023-10-29 NOTE — ED Triage Notes (Addendum)
 Pt to ED via EMS after a fall. Pt went to step over something & lost balance & fell over. Tried bracing herself with her hand and landed on left shoulder. Unable to move left shoulder. Crepitus felt by EMS. 8/10 pain. Refused narcotics with EMS. + radial pulses, full sensation. Also has thumb pain. BG 154. VSS. 130/90 98% 88

## 2023-10-29 NOTE — Consult Note (Signed)
 Reason for Consult:Left humerus fx Referring Physician: Eber Hong Time called: 1420 Time at bedside: 1452   Jade Sanchez is an 83 y.o. female.  HPI: Jade Bible was walking in the rec center at her senior living facility when she tripped over some rolling boards and fell. She had immediate left shoulder pain and could not get up. She was brought to the ED where x-rays showed a humeral head fx and orthopedic surgery was consulted. She is RHD and lives alone. She often uses a RW to ambulate if she's going long distances.  Past Medical History:  Diagnosis Date   Arthritis    affects knees and hands   DM Type 2    diet controlled   Family history of colon cancer    Family history of melanoma    Gout attack 07/27/2021   left foot none since   Hyperlipidemia    Hyperparathyroidism (HCC)    no meds taken monitored q 6 months by pcp   Hypertension    Thickened endometrium     Past Surgical History:  Procedure Laterality Date   BREAST BIOPSY Left    benign 1989   BREAST BIOPSY Right    DILATATION & CURETTAGE/HYSTEROSCOPY WITH MYOSURE N/A 08/29/2021   Procedure: DILATATION & CURETTAGE/HYSTEROSCOPY WITH MYOSURE;  Surgeon: Carver Fila, MD;  Location: [facility] ;  Service: Gynecology;  Laterality: N/A;   ENDOMETRIAL BIOPSY  08/02/2021   ESOPHAGOGASTRODUODENOSCOPY (EGD) WITH PROPOFOL N/A 07/21/2021   Procedure: ESOPHAGOGASTRODUODENOSCOPY (EGD) WITH PROPOFOL;  Surgeon: Vida Rigger, MD;  Location: [facility] ENDOSCOPY;  Service: Endoscopy;  Laterality: N/A;   MENISCUS REPAIR Left    yrs ago per pt on 08-23-2021   TUBAL LIGATION     late 30's    Family History  Problem Relation Age of Onset   Cancer Mother    Colon cancer Father    Colon cancer Paternal Grandfather    Breast cancer Neg Hx    Ovarian cancer Neg Hx    Endometrial cancer Neg Hx    Pancreatic cancer Neg Hx    Prostate cancer Neg Hx     Social History:  reports that she quit smoking about 48  years ago. Her smoking use included cigarettes. She started smoking about 58 years ago. She has a 5 pack-year smoking history. She has never used smokeless tobacco. She reports that she does not drink alcohol and does not use drugs.  Allergies: No Known Allergies  Medications: I have reviewed the patient's current medications.  Results for orders placed or performed during the hospital encounter of 10/29/23 (from the past 48 hours)  Basic metabolic panel     Status: Abnormal   Collection Time: 10/29/23  1:29 PM  Result Value Ref Range   Sodium 135 135 - 145 mmol/L   Potassium 4.0 3.5 - 5.1 mmol/L   Chloride 106 98 - 111 mmol/L   CO2 20 (L) 22 - 32 mmol/L   Glucose, Bld 150 (H) 70 - 99 mg/dL    Comment: Glucose reference range applies only to samples taken after fasting for at least 8 hours.   BUN 16 8 - 23 mg/dL   Creatinine, Ser 9.52 (H) 0.44 - 1.00 mg/dL   Calcium 84.1 (H) 8.9 - 10.3 mg/dL   GFR, Estimated 47 (L) >60 mL/min    Comment: (NOTE) Calculated using the CKD-EPI Creatinine Equation (2021)    Anion gap 9 5 - 15    Comment: Performed at [facility],  [address], [place], [place] [address]  CBC with Differential     Status: None   Collection Time: 10/29/23  1:29 PM  Result Value Ref Range   WBC 9.1 4.0 - 10.5 K/uL   RBC 4.73 3.87 - 5.11 MIL/uL   Hemoglobin 14.5 12.0 - 15.0 g/dL   HCT 60.4 54.0 - 98.1 %   MCV 89.9 80.0 - 100.0 fL   MCH 30.7 26.0 - 34.0 pg   MCHC 34.1 30.0 - 36.0 g/dL   RDW 19.1 47.8 - 29.5 %   Platelets 297 150 - 400 K/uL   nRBC 0.0 0.0 - 0.2 %   Neutrophils Relative % 61 %   Neutro Abs 5.5 1.7 - 7.7 K/uL   Lymphocytes Relative 28 %   Lymphs Abs 2.5 0.7 - 4.0 K/uL   Monocytes Relative 8 %   Monocytes Absolute 0.8 0.1 - 1.0 K/uL   Eosinophils Relative 2 %   Eosinophils Absolute 0.2 0.0 - 0.5 K/uL   Basophils Relative 0 %   Basophils Absolute 0.0 0.0 - 0.1 K/uL   Immature Granulocytes 1 %   Abs Immature Granulocytes 0.06 0.00 - 0.07  K/uL    Comment: Performed at [facility], [address] [place], [place] [address]  I-stat chem 8, ED (not at [facility], [facility] or [facility])     Status: Abnormal   Collection Time: 10/29/23  1:36 PM  Result Value Ref Range   Sodium 136 135 - 145 mmol/L   Potassium 4.1 3.5 - 5.1 mmol/L   Chloride 108 98 - 111 mmol/L   BUN 18 8 - 23 mg/dL   Creatinine, Ser 8.65 (H) 0.44 - 1.00 mg/dL   Glucose, Bld 784 (H) 70 - 99 mg/dL    Comment: Glucose reference range applies only to samples taken after fasting for at least 8 hours.   Calcium, Ion 1.25 1.15 - 1.40 mmol/L   TCO2 20 (L) 22 - 32 mmol/L   Hemoglobin 13.9 12.0 - 15.0 g/dL   HCT 69.6 29.5 - 28.4 %    CT Head Wo Contrast Result Date: 10/29/2023 CLINICAL DATA:  Trauma, fall, landed on left shoulder. EXAM: CT HEAD WITHOUT CONTRAST CT CERVICAL SPINE WITHOUT CONTRAST TECHNIQUE: Multidetector CT imaging of the head and cervical spine was performed following the standard protocol without intravenous contrast. Multiplanar CT image reconstructions of the cervical spine were also generated. RADIATION DOSE REDUCTION: This exam was performed according to the departmental dose-optimization program which includes automated exposure control, adjustment of the mA and/or kV according to patient size and/or use of iterative reconstruction technique. COMPARISON:  None Available. FINDINGS: CT HEAD FINDINGS Brain: No acute intracranial hemorrhage. No CT evidence of acute infarct. No edema, mass effect, or midline shift. The basilar cisterns are patent. Ventricles: The ventricles are normal. Vascular: No hyperdense vessel or unexpected calcification. Skull: No acute or aggressive finding. Orbits: Orbits are symmetric. Sinuses: Mucosal thickening in the ethmoid sinuses and bilateral maxillary sinuses. Other: Mastoid air cells are clear. CT CERVICAL SPINE FINDINGS Alignment: Straightening of the normal cervical lordosis. Trace anterolisthesis of C4 on C5. No facet subluxation or  dislocation. Skull base and vertebrae: No compression fracture or displaced fracture in the cervical spine. No suspicious osseous lesion. Soft tissues and spinal canal: No prevertebral fluid or swelling. No visible canal hematoma. Disc levels: Intervertebral disc spaces are maintained. Degenerative endplate osteophytes at multiple levels. No evidence of high-grade spinal canal stenosis. Facet arthrosis at multiple levels throughout the  cervical spine. Upper chest: Negative. Other: None. IMPRESSION: 1. No acute intracranial abnormality. 2. No acute fracture or traumatic malalignment of the cervical spine. 3. Multilevel degenerative changes in the cervical spine. Electronically Signed   By: Emily Filbert M.D.   On: 10/29/2023 15:48   CT Cervical Spine Wo Contrast Result Date: 10/29/2023 CLINICAL DATA:  Trauma, fall, landed on left shoulder. EXAM: CT HEAD WITHOUT CONTRAST CT CERVICAL SPINE WITHOUT CONTRAST TECHNIQUE: Multidetector CT imaging of the head and cervical spine was performed following the standard protocol without intravenous contrast. Multiplanar CT image reconstructions of the cervical spine were also generated. RADIATION DOSE REDUCTION: This exam was performed according to the departmental dose-optimization program which includes automated exposure control, adjustment of the mA and/or kV according to patient size and/or use of iterative reconstruction technique. COMPARISON:  None Available. FINDINGS: CT HEAD FINDINGS Brain: No acute intracranial hemorrhage. No CT evidence of acute infarct. No edema, mass effect, or midline shift. The basilar cisterns are patent. Ventricles: The ventricles are normal. Vascular: No hyperdense vessel or unexpected calcification. Skull: No acute or aggressive finding. Orbits: Orbits are symmetric. Sinuses: Mucosal thickening in the ethmoid sinuses and bilateral maxillary sinuses. Other: Mastoid air cells are clear. CT CERVICAL SPINE FINDINGS Alignment: Straightening of the  normal cervical lordosis. Trace anterolisthesis of C4 on C5. No facet subluxation or dislocation. Skull base and vertebrae: No compression fracture or displaced fracture in the cervical spine. No suspicious osseous lesion. Soft tissues and spinal canal: No prevertebral fluid or swelling. No visible canal hematoma. Disc levels: Intervertebral disc spaces are maintained. Degenerative endplate osteophytes at multiple levels. No evidence of high-grade spinal canal stenosis. Facet arthrosis at multiple levels throughout the cervical spine. Upper chest: Negative. Other: None. IMPRESSION: 1. No acute intracranial abnormality. 2. No acute fracture or traumatic malalignment of the cervical spine. 3. Multilevel degenerative changes in the cervical spine. Electronically Signed   By: Emily Filbert M.D.   On: 10/29/2023 15:48   DG Hand Complete Left Result Date: 10/29/2023 CLINICAL DATA:  Pain after fall. EXAM: LEFT HAND - COMPLETE 3+ VIEW COMPARISON:  None Available. FINDINGS: Irregularity of the thumb distal phalanx is suspicious for nondisplaced fracture. This extends to the interphalangeal joint articular surface. No other fracture of the hand. Osteoarthritis involving the digits and thumb carpal metacarpal joint. The bones are diffusely under mineralized. No definite focal soft tissue abnormalities. IMPRESSION: 1. Irregularity of the thumb distal phalanx is suspicious for nondisplaced fracture. This extends to the interphalangeal joint articular surface. 2. Osteoarthritis. Electronically Signed   By: Narda Rutherford M.D.   On: 10/29/2023 15:34   DG Shoulder Left Result Date: 10/29/2023 CLINICAL DATA:  Lost balance leading to fall. Unable to move left shoulder. EXAM: LEFT SHOULDER - 2+ VIEW; LEFT HUMERUS - 2+ VIEW COMPARISON:  None Available. FINDINGS: Shoulder: Comminuted displaced proximal humerus fracture. The humeral head likely remains congruent with the glenoid, however there is a significant rotational  component. Suspect apex lateral angulation. There is a displaced fragment involving the lateral humeral head. The humeral shaft appears to be displaced anteriorly. CT is planned for further assessment. The acromioclavicular joint is congruent. Humerus: The distal humerus is intact. Elbow alignment is poorly assessed on provided views. IMPRESSION: 1. Comminuted displaced proximal humerus fracture. The humeral head likely remains congruent with the glenoid, however there is a significant rotational component in the humeral shaft appears to be displaced anteriorly. 2. No distal humerus fracture. Electronically Signed   By: Ivette Loyal.D.  On: 10/29/2023 15:32   DG Humerus Left Result Date: 10/29/2023 CLINICAL DATA:  Lost balance leading to fall. Unable to move left shoulder. EXAM: LEFT SHOULDER - 2+ VIEW; LEFT HUMERUS - 2+ VIEW COMPARISON:  None Available. FINDINGS: Shoulder: Comminuted displaced proximal humerus fracture. The humeral head likely remains congruent with the glenoid, however there is a significant rotational component. Suspect apex lateral angulation. There is a displaced fragment involving the lateral humeral head. The humeral shaft appears to be displaced anteriorly. CT is planned for further assessment. The acromioclavicular joint is congruent. Humerus: The distal humerus is intact. Elbow alignment is poorly assessed on provided views. IMPRESSION: 1. Comminuted displaced proximal humerus fracture. The humeral head likely remains congruent with the glenoid, however there is a significant rotational component in the humeral shaft appears to be displaced anteriorly. 2. No distal humerus fracture. Electronically Signed   By: Narda Rutherford M.D.   On: 10/29/2023 15:32    Review of Systems  HENT:  Negative for ear discharge, ear pain, hearing loss and tinnitus.   Eyes:  Negative for photophobia and pain.  Respiratory:  Negative for cough and shortness of breath.   Cardiovascular:   Negative for chest pain.  Gastrointestinal:  Negative for abdominal pain, nausea and vomiting.  Genitourinary:  Negative for dysuria, flank pain, frequency and urgency.  Musculoskeletal:  Positive for arthralgias (Left shouler, left thumb). Negative for back pain, myalgias and neck pain.  Neurological:  Negative for dizziness and headaches.  Hematological:  Does not bruise/bleed easily.  Psychiatric/Behavioral:  The patient is not nervous/anxious.    Blood pressure 139/74, pulse 88, temperature 97.6 F (36.4 C), temperature source Oral, resp. rate 17, height 5\' 7"  (1.702 m), weight 104.3 kg, SpO2 99%. Physical Exam Constitutional:      General: She is not in acute distress.    Appearance: She is well-developed. She is not diaphoretic.  HENT:     Head: Normocephalic and atraumatic.  Eyes:     General: No scleral icterus.       Right eye: No discharge.        Left eye: No discharge.     Conjunctiva/sclera: Conjunctivae normal.  Cardiovascular:     Rate and Rhythm: Normal rate and regular rhythm.  Pulmonary:     Effort: Pulmonary effort is normal. No respiratory distress.  Musculoskeletal:     Cervical back: Normal range of motion.     Comments: Left shoulder, elbow, wrist, digits- no skin wounds, severe TTP shoulder, mod TTP thumb, no instability, no blocks to motion  Sens  Ax/R/M/U intact  Mot   Ax/ R/ PIN/ M/ AIN/ U intact  Rad 2+  Skin:    General: Skin is warm and dry.  Neurological:     Mental Status: She is alert.  Psychiatric:        Mood and Affect: Mood normal.        Behavior: Behavior normal.     Assessment/Plan: Left humerus fx -- Plan reverse total shoulder tomorrow with Dr. Everardo Pacific at [facility]. Please keep NPO after MN. Left thumb fx -- Will place in thumb spica removable splint.    Freeman Caldron, PA-C Orthopedic Surgery [phone] 10/29/2023, 3:56 PM

## 2023-10-29 NOTE — Plan of Care (Signed)

## 2023-10-29 NOTE — ED Provider Notes (Signed)
  EMERGENCY DEPARTMENT AT [facility] Provider Note   CSN: [id number] Arrival date & time: 10/29/23  1259     History  Chief Complaint  Patient presents with   Jade Sanchez    Jade Sanchez is a 83 y.o. female.  83 year old female presents today for concern of fall.  She states that she tripped over a stand with wheels on the bottom.  She states that she hit her head but does not believe she hit it hard.  No loss of consciousness.  She states she tried to brace herself and injured her left shoulder.  Also complains of pain over her left hand.  The history is provided by the patient. No language interpreter was used.       Home Medications Prior to Admission medications   Medication Sig Start Date End Date Taking? Authorizing Provider  acetaminophen (TYLENOL) 500 MG tablet Take 500 mg by mouth every 6 (six) hours as needed for moderate pain or headache.    [provider]  ciclopirox (PENLAC) 8 % solution Apply topically at bedtime. Apply over nail and surrounding skin. Apply daily over previous coat. After seven (7) days, may remove with alcohol and continue cycle. 04/30/22   Vivi Barrack, DPM  Fexofenadine HCl (ALLEGRA PO) Take by mouth as needed.    [provider]  furosemide (LASIX) 40 MG tablet Take 40 mg by mouth daily. 07/10/21   [provider]  potassium chloride (KLOR-CON) 10 MEQ tablet Take 10 mEq by mouth daily. 07/03/21   [provider]  rosuvastatin (CRESTOR) 5 MG tablet Take 5 mg by mouth daily. 07/03/21   [provider]  telmisartan (MICARDIS) 80 MG tablet Take 80 mg by mouth daily. 07/03/21   [provider]      Allergies    Patient has no known allergies.    Review of Systems   Review of Systems  Constitutional:  Negative for chills and fever.  Musculoskeletal:  Positive for arthralgias.  Neurological:  Negative for syncope and headaches.  All other systems reviewed and  are negative.   Physical Exam Updated Vital Signs BP 139/74   Pulse 88   Temp 97.6 F (36.4 C) (Oral)   Resp 17   Ht 5\' 7"  (1.702 m)   Wt 104.3 kg   SpO2 99%   BMI 36.02 kg/m  Physical Exam Vitals and nursing note reviewed.  Constitutional:      General: She is not in acute distress.    Appearance: Normal appearance. She is not ill-appearing.  HENT:     Head: Normocephalic and atraumatic.     Nose: Nose normal.  Eyes:     Conjunctiva/sclera: Conjunctivae normal.  Pulmonary:     Effort: Pulmonary effort is normal. No respiratory distress.  Musculoskeletal:        General: No deformity.     Comments: Left shoulder in a sling.  Neurovascularly intact.  Skin:    Findings: No rash.  Neurological:     Mental Status: She is alert.     ED Results / Procedures / Treatments   Labs (all labs ordered are listed, but only abnormal results are displayed) Labs Reviewed  BASIC METABOLIC PANEL - Abnormal; Notable for the following components:      Result Value   CO2 20 (*)    Glucose, Bld 150 (*)    Creatinine, Ser 1.18 (*)    Calcium 10.7 (*)    GFR, Estimated 47 (*)  All other components within normal limits  I-STAT CHEM 8, ED - Abnormal; Notable for the following components:   Creatinine, Ser 1.20 (*)    Glucose, Bld 151 (*)    TCO2 20 (*)    All other components within normal limits  CBC WITH DIFFERENTIAL/PLATELET    EKG None  Radiology DG Hand Complete Left Result Date: 10/29/2023 CLINICAL DATA:  Pain after fall. EXAM: LEFT HAND - COMPLETE 3+ VIEW COMPARISON:  None Available. FINDINGS: Irregularity of the thumb distal phalanx is suspicious for nondisplaced fracture. This extends to the interphalangeal joint articular surface. No other fracture of the hand. Osteoarthritis involving the digits and thumb carpal metacarpal joint. The bones are diffusely under mineralized. No definite focal soft tissue abnormalities. IMPRESSION: 1. Irregularity of the thumb distal  phalanx is suspicious for nondisplaced fracture. This extends to the interphalangeal joint articular surface. 2. Osteoarthritis. Electronically Signed   By: Narda Rutherford M.D.   On: 10/29/2023 15:34   DG Shoulder Left Result Date: 10/29/2023 CLINICAL DATA:  Lost balance leading to fall. Unable to move left shoulder. EXAM: LEFT SHOULDER - 2+ VIEW; LEFT HUMERUS - 2+ VIEW COMPARISON:  None Available. FINDINGS: Shoulder: Comminuted displaced proximal humerus fracture. The humeral head likely remains congruent with the glenoid, however there is a significant rotational component. Suspect apex lateral angulation. There is a displaced fragment involving the lateral humeral head. The humeral shaft appears to be displaced anteriorly. CT is planned for further assessment. The acromioclavicular joint is congruent. Humerus: The distal humerus is intact. Elbow alignment is poorly assessed on provided views. IMPRESSION: 1. Comminuted displaced proximal humerus fracture. The humeral head likely remains congruent with the glenoid, however there is a significant rotational component in the humeral shaft appears to be displaced anteriorly. 2. No distal humerus fracture. Electronically Signed   By: Narda Rutherford M.D.   On: 10/29/2023 15:32   DG Humerus Left Result Date: 10/29/2023 CLINICAL DATA:  Lost balance leading to fall. Unable to move left shoulder. EXAM: LEFT SHOULDER - 2+ VIEW; LEFT HUMERUS - 2+ VIEW COMPARISON:  None Available. FINDINGS: Shoulder: Comminuted displaced proximal humerus fracture. The humeral head likely remains congruent with the glenoid, however there is a significant rotational component. Suspect apex lateral angulation. There is a displaced fragment involving the lateral humeral head. The humeral shaft appears to be displaced anteriorly. CT is planned for further assessment. The acromioclavicular joint is congruent. Humerus: The distal humerus is intact. Elbow alignment is poorly assessed on  provided views. IMPRESSION: 1. Comminuted displaced proximal humerus fracture. The humeral head likely remains congruent with the glenoid, however there is a significant rotational component in the humeral shaft appears to be displaced anteriorly. 2. No distal humerus fracture. Electronically Signed   By: Narda Rutherford M.D.   On: 10/29/2023 15:32    Procedures Procedures    Medications Ordered in ED Medications  fentaNYL (SUBLIMAZE) injection 50 mcg (50 mcg Intravenous Given 10/29/23 1324)  ondansetron (ZOFRAN) injection 4 mg (4 mg Intravenous Given 10/29/23 1324)  morphine (PF) 4 MG/ML injection 4 mg (4 mg Intravenous Given 10/29/23 1434)    ED Course/ Medical Decision Making/ A&P Clinical Course as of 10/29/23 1538  Tue Oct 29, 2023  1514 Needs admission for pain control. Shattered humerus. Planning for pain control and outpatient follow up. Ortho will follow along. If a slot opens up they will perform surgery while she is here. They are not hopeful they will be able to get her in during this  admission.  [CG]  1533 Call Dr Katrinka Blazing back when images come back [CG]    Clinical Course User Index [CG] Al Decant, PA-C                                 Medical Decision Making Amount and/or Complexity of Data Reviewed Labs: ordered. Radiology: ordered.  Risk Prescription drug management.   Medical Decision Making / ED Course   This patient presents to the ED for concern of fall, left shoulder pain, this involves an extensive number of treatment options, and is a complaint that carries with it a high risk of complications and morbidity.  The differential diagnosis includes head injury, fracture, dislocation  MDM: 83 year old female presents today for concern of fall.  She is splinting her left shoulder.  Significant tenderness to palpation.  Neurovascularly intact.  Endorses head injury but denies loss of consciousness.  Plain radiographs of the left shoulder, humerus, left  hand ordered.  CT head and cervical spine ordered.  Imaging did not result but was reviewed by Earney Hamburg with orthopedist.  Very obvious shattered fracture of the humeral head.  Initial plan was for discharge once pain is controlled and outpatient follow-up for surgery.  However patient's pain is uncontrollable at this point and they recommend admission to medicine and they will consult.  They will attempt to do surgery if they are able during the admission.  Will order shoulder immobilizer and sling.  Signed out to oncoming provider to discuss with hospitalist for admission.    Lab Tests: -I ordered, reviewed, and interpreted labs.   The pertinent results include:   Labs Reviewed  BASIC METABOLIC PANEL - Abnormal; Notable for the following components:      Result Value   CO2 20 (*)    Glucose, Bld 150 (*)    Creatinine, Ser 1.18 (*)    Calcium 10.7 (*)    GFR, Estimated 47 (*)    All other components within normal limits  I-STAT CHEM 8, ED - Abnormal; Notable for the following components:   Creatinine, Ser 1.20 (*)    Glucose, Bld 151 (*)    TCO2 20 (*)    All other components within normal limits  CBC WITH DIFFERENTIAL/PLATELET      EKG  EKG Interpretation Date/Time:    Ventricular Rate:    PR Interval:    QRS Duration:    QT Interval:    QTC Calculation:   R Axis:      Text Interpretation:           Imaging Studies ordered: I ordered imaging studies including left shoulder x-ray, left humerus x-ray, left hand x-ray, CT head, CT cervical spine I independently visualized and interpreted imaging. I agree with the radiologist interpretation   Medicines ordered and prescription drug management: Meds ordered this encounter  Medications   fentaNYL (SUBLIMAZE) injection 50 mcg   ondansetron (ZOFRAN) injection 4 mg   morphine (PF) 4 MG/ML injection 4 mg   DISCONTD: fentaNYL (SUBLIMAZE) injection 25 mcg    -I have reviewed the patients home medicines and have  made adjustments as needed    Reevaluation: After the interventions noted above, I reevaluated the patient and found that they have :improved  Co morbidities that complicate the patient evaluation  Past Medical History:  Diagnosis Date   Arthritis    affects knees and hands   DM Type 2  diet controlled   Family history of colon cancer    Family history of melanoma    Gout attack 07/27/2021   left foot none since   Hyperlipidemia    Hyperparathyroidism (HCC)    no meds taken monitored q 6 months by pcp   Hypertension    Thickened endometrium       Dispostion: Signed out to oncoming provider to discuss with hospitalist for admission.   Final Clinical Impression(s) / ED Diagnoses Final diagnoses:  Acute pain of left shoulder due to trauma    Rx / DC Orders ED Discharge Orders     None         Marita Kansas, PA-C 10/29/23 1543    Wynetta Fines, MD 10/29/23 [phone]

## 2023-10-29 NOTE — H&P (Signed)
 History and Physical    Patient: Jade Sanchez ZOX:[id number] DOB: 1941-03-02 DOA: 10/29/2023 DOS: the patient was seen and examined on 10/29/2023 PCP: Emilio Aspen, MD  [facility]  Chief Complaint:  Chief Complaint  Patient presents with   Fall   HPI: Jade Sanchez is a 83 y.o. female with medical history significant of hypertension, hyperlipidemia, diabetes mellitus type 2, hyperparathyroidism, and arthritis who presents after having a fall.  The fall occurred while she was attending a class at the senior center. She attempted to step over the wheels of a large bulletin board on rollers, but foot got caught causing her to fall. She tried to catch herself by placing her hand on the board, but it rolled away. She did  hit her head during the fall, but denied any significant pain in her head. The pain was immediate and severe in her left shoulder, persisting since the incident. She is unable to move her left arm independently due to the pain.   She usually does not require an assistive device for daily activities but uses a walker for longer distances or events, which allows her to walk comfortably for extended periods. She is generally very cautious to avoid falls.   Upon admission into the emergency department patient was noted to be afebrile with stable vital signs.  Labs noted creatinine 1.18, BUN 16, and calcium 10.7.  X-rays of the left shoulder revealed communicated displaced proximal humerus fracture.  X-rays of the left hand revealed irregularity of the thumb distal phalanx suspicious for nondisplaced fracture.  Orthopedics have been formally consulted.  Patient had been given fentanyl 50 mcg Zofran 4 mg IV.  Dr. Everardo Pacific of orthopedic surgery was available to do a total shoulder replacement tomorrow at [facility].   Review of Systems: As mentioned in the history of present illness. All other systems reviewed and are  negative. Past Medical History:  Diagnosis Date   Arthritis    affects knees and hands   DM Type 2    diet controlled   Family history of colon cancer    Family history of melanoma    Gout attack 07/27/2021   left foot none since   Hyperlipidemia    Hyperparathyroidism (HCC)    no meds taken monitored q 6 months by pcp   Hypertension    Thickened endometrium    Past Surgical History:  Procedure Laterality Date   BREAST BIOPSY Left    benign 1989   BREAST BIOPSY Right    DILATATION & CURETTAGE/HYSTEROSCOPY WITH MYOSURE N/A 08/29/2021   Procedure: DILATATION & CURETTAGE/HYSTEROSCOPY WITH MYOSURE;  Surgeon: Carver Fila, MD;  Location: [facility] [place] [place];  Service: Gynecology;  Laterality: N/A;   ENDOMETRIAL BIOPSY  08/02/2021   ESOPHAGOGASTRODUODENOSCOPY (EGD) WITH PROPOFOL N/A 07/21/2021   Procedure: ESOPHAGOGASTRODUODENOSCOPY (EGD) WITH PROPOFOL;  Surgeon: Vida Rigger, MD;  Location: [facility] ENDOSCOPY;  Service: Endoscopy;  Laterality: N/A;   MENISCUS REPAIR Left    yrs ago per pt on 08-23-2021   TUBAL LIGATION     late 30's   Social History:  reports that she quit smoking about 48 years ago. Her smoking use included cigarettes. She started smoking about 58 years ago. She has a 5 pack-year smoking history. She has never used smokeless tobacco. She reports that she does not drink alcohol and does not use drugs.  No Known Allergies  Family History  Problem Relation Age of Onset   Cancer Mother  Colon cancer Father    Colon cancer Paternal Grandfather    Breast cancer Neg Hx    Ovarian cancer Neg Hx    Endometrial cancer Neg Hx    Pancreatic cancer Neg Hx    Prostate cancer Neg Hx     Prior to Admission medications   Medication Sig Start Date End Date Taking? Authorizing Provider  acetaminophen (TYLENOL) 500 MG tablet Take 500 mg by mouth every 6 (six) hours as needed for moderate pain or headache.    [provider]  ciclopirox (PENLAC) 8 % solution  Apply topically at bedtime. Apply over nail and surrounding skin. Apply daily over previous coat. After seven (7) days, may remove with alcohol and continue cycle. 04/30/22   Vivi Barrack, DPM  Fexofenadine HCl (ALLEGRA PO) Take by mouth as needed.    [provider]  furosemide (LASIX) 40 MG tablet Take 40 mg by mouth daily. 07/10/21   [provider]  potassium chloride (KLOR-CON) 10 MEQ tablet Take 10 mEq by mouth daily. 07/03/21   [provider]  rosuvastatin (CRESTOR) 5 MG tablet Take 5 mg by mouth daily. 07/03/21   [provider]  telmisartan (MICARDIS) 80 MG tablet Take 80 mg by mouth daily. 07/03/21   [provider]    Physical Exam: Vitals:   10/29/23 1315 10/29/23 1317  BP: 139/74   Pulse: 88   Resp: 17   Temp: 97.6 F (36.4 C)   TempSrc: Oral   SpO2: 99%   Weight:  104.3 kg  Height:  5\' 7"  (1.702 m)    Constitutional: Elderly female currently in some discomfort Eyes: PERRL, lids and conjunctivae normal ENMT: Mucous membranes are moist.  Fair dentition Neck: normal, supple  Respiratory: clear to auscultation bilaterally, no wheezing, no crackles. Normal respiratory effort.   Cardiovascular: Regular rate and rhythm, no murmurs / rubs / gallops.  Trace lower extremity edema patient has compression stockings on. Abdomen: no tenderness, no masses palpated.   Bowel sounds positive.  Musculoskeletal: no clubbing / cyanosis.  Left arm currently in sling with tenderness to palpation present.  Decreased range of motion of the left arm due to pain as well as left thumb Skin: Significant bruising noted of the left thumb. Neurologic: CN 2-12 grossly intact.  . Strength 5/5 in all 4.  Psychiatric: Normal judgment and insight. Alert and oriented x 3. Normal mood.   Data Reviewed:    Assessment and Plan:  Left proximal humerus and left thumb fracture secondary to fall Patient had a mechanical fall at the senior center today  tripping over rolling bulletin board injuring her left shoulder and thumb.  X-rays revealed left communicated displaced proximal humerus fracture and left hand revealed irregularity of the thumb distal phalanx suspicious for nondisplaced fracture.  Dr. Everardo Pacific of orthopedic surgery recommended transfer to [facility] for total shoulder replacement in a.m. -Admit to a telemetry bed -Continue left arm in sling -Continue left thumb splint -N.p.o. after midnight -Nonweightbearing on left arm -PT/OT to evaluate and treat -Appreciate orthopedic consultative services, will follow-up for any further recommendations.  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  Appears higher than previous baseline 0.8 when last checked back in 2022. -Check urinalysis -Add on CK -Gentle IV fluids -Recheck kidney function in a.m.  Essential hypertension On admission blood pressures are well-controlled. -Continue pharmacy substitution for telmisartan -Held furosemide initially due to concern for possible AKI.  Resume when medically appropriate  Primary hyperparathyroidism Hypercalcemia Acute.  Patient has a history of hyperparathyroidism.  Currently not on any medications.  Calcium noted to be mildly elevated at 10.7. -Gentle IV fluids -Recheck calcium levels in a.m.  Controlled diabetes mellitus type 2, without long-term use of insulin On admission glucose noted to be mildly elevated at 150.  Last available hemoglobin A1c was 6.8 when checked back in 2022.  Patient appears to be diet controlled and not on any medications. -Add-on hemoglobin A1c -Continue to monitor and start sliding scale insulin if deemed medically appropriate  Hyperlipidemia  -Continue Crestor  Obesity BMI 36.02 kg/m   DVT prophylaxis: SCDs.  Resume Lovenox following procedure Advance Care Planning:   Code Status: Prior     Consults: Orthopedic surgery  Family  Communication: None  Severity of Illness: The appropriate patient status for this patient is INPATIENT. Inpatient status is judged to be reasonable and necessary in order to provide the required intensity of service to ensure the patient's safety. The patient's presenting symptoms, physical exam findings, and initial radiographic and laboratory data in the context of their chronic comorbidities is felt to place them at high risk for further clinical deterioration. Furthermore, it is not anticipated that the patient will be medically stable for discharge from the hospital within 2 midnights of admission.   * I certify that at the point of admission it is my clinical judgment that the patient will require inpatient hospital care spanning beyond 2 midnights from the point of admission due to high intensity of service, high risk for further deterioration and high frequency of surveillance required.*  Author: Clydie Braun, MD 10/29/2023 3:23 PM  For on call review [email or web address].

## 2023-10-29 NOTE — H&P (View-Only) (Signed)
 Reason for Consult:Left humerus fx Referring Physician: Eber Hong Time called: 1420 Time at bedside: 1452   Jade Sanchez is an 83 y.o. female.  HPI: Jade Bible was walking in the rec center at her senior living facility when she tripped over some rolling boards and fell. She had immediate left shoulder pain and could not get up. She was brought to the ED where x-rays showed a humeral head fx and orthopedic surgery was consulted. She is RHD and lives alone. She often uses a RW to ambulate if she's going long distances.  Past Medical History:  Diagnosis Date   Arthritis    affects knees and hands   DM Type 2    diet controlled   Family history of colon cancer    Family history of melanoma    Gout attack 07/27/2021   left foot none since   Hyperlipidemia    Hyperparathyroidism (HCC)    no meds taken monitored q 6 months by pcp   Hypertension    Thickened endometrium     Past Surgical History:  Procedure Laterality Date   BREAST BIOPSY Left    benign 1989   BREAST BIOPSY Right    DILATATION & CURETTAGE/HYSTEROSCOPY WITH MYOSURE N/A 08/29/2021   Procedure: DILATATION & CURETTAGE/HYSTEROSCOPY WITH MYOSURE;  Surgeon: Carver Fila, MD;  Location: [facility] ;  Service: Gynecology;  Laterality: N/A;   ENDOMETRIAL BIOPSY  08/02/2021   ESOPHAGOGASTRODUODENOSCOPY (EGD) WITH PROPOFOL N/A 07/21/2021   Procedure: ESOPHAGOGASTRODUODENOSCOPY (EGD) WITH PROPOFOL;  Surgeon: Vida Rigger, MD;  Location: [facility] ENDOSCOPY;  Service: Endoscopy;  Laterality: N/A;   MENISCUS REPAIR Left    yrs ago per pt on 08-23-2021   TUBAL LIGATION     late 30's    Family History  Problem Relation Age of Onset   Cancer Mother    Colon cancer Father    Colon cancer Paternal Grandfather    Breast cancer Neg Hx    Ovarian cancer Neg Hx    Endometrial cancer Neg Hx    Pancreatic cancer Neg Hx    Prostate cancer Neg Hx     Social History:  reports that she quit smoking about 48  years ago. Her smoking use included cigarettes. She started smoking about 58 years ago. She has a 5 pack-year smoking history. She has never used smokeless tobacco. She reports that she does not drink alcohol and does not use drugs.  Allergies: No Known Allergies  Medications: I have reviewed the patient's current medications.  Results for orders placed or performed during the hospital encounter of 10/29/23 (from the past 48 hours)  Basic metabolic panel     Status: Abnormal   Collection Time: 10/29/23  1:29 PM  Result Value Ref Range   Sodium 135 135 - 145 mmol/L   Potassium 4.0 3.5 - 5.1 mmol/L   Chloride 106 98 - 111 mmol/L   CO2 20 (L) 22 - 32 mmol/L   Glucose, Bld 150 (H) 70 - 99 mg/dL    Comment: Glucose reference range applies only to samples taken after fasting for at least 8 hours.   BUN 16 8 - 23 mg/dL   Creatinine, Ser 9.52 (H) 0.44 - 1.00 mg/dL   Calcium 84.1 (H) 8.9 - 10.3 mg/dL   GFR, Estimated 47 (L) >60 mL/min    Comment: (NOTE) Calculated using the CKD-EPI Creatinine Equation (2021)    Anion gap 9 5 - 15    Comment: Performed at [facility],  [address], [place], [place] [address]  CBC with Differential     Status: None   Collection Time: 10/29/23  1:29 PM  Result Value Ref Range   WBC 9.1 4.0 - 10.5 K/uL   RBC 4.73 3.87 - 5.11 MIL/uL   Hemoglobin 14.5 12.0 - 15.0 g/dL   HCT 60.4 54.0 - 98.1 %   MCV 89.9 80.0 - 100.0 fL   MCH 30.7 26.0 - 34.0 pg   MCHC 34.1 30.0 - 36.0 g/dL   RDW 19.1 47.8 - 29.5 %   Platelets 297 150 - 400 K/uL   nRBC 0.0 0.0 - 0.2 %   Neutrophils Relative % 61 %   Neutro Abs 5.5 1.7 - 7.7 K/uL   Lymphocytes Relative 28 %   Lymphs Abs 2.5 0.7 - 4.0 K/uL   Monocytes Relative 8 %   Monocytes Absolute 0.8 0.1 - 1.0 K/uL   Eosinophils Relative 2 %   Eosinophils Absolute 0.2 0.0 - 0.5 K/uL   Basophils Relative 0 %   Basophils Absolute 0.0 0.0 - 0.1 K/uL   Immature Granulocytes 1 %   Abs Immature Granulocytes 0.06 0.00 - 0.07  K/uL    Comment: Performed at [facility], [address] [place], [place] [address]  I-stat chem 8, ED (not at [facility], [facility] or [facility])     Status: Abnormal   Collection Time: 10/29/23  1:36 PM  Result Value Ref Range   Sodium 136 135 - 145 mmol/L   Potassium 4.1 3.5 - 5.1 mmol/L   Chloride 108 98 - 111 mmol/L   BUN 18 8 - 23 mg/dL   Creatinine, Ser 8.65 (H) 0.44 - 1.00 mg/dL   Glucose, Bld 784 (H) 70 - 99 mg/dL    Comment: Glucose reference range applies only to samples taken after fasting for at least 8 hours.   Calcium, Ion 1.25 1.15 - 1.40 mmol/L   TCO2 20 (L) 22 - 32 mmol/L   Hemoglobin 13.9 12.0 - 15.0 g/dL   HCT 69.6 29.5 - 28.4 %    CT Head Wo Contrast Result Date: 10/29/2023 CLINICAL DATA:  Trauma, fall, landed on left shoulder. EXAM: CT HEAD WITHOUT CONTRAST CT CERVICAL SPINE WITHOUT CONTRAST TECHNIQUE: Multidetector CT imaging of the head and cervical spine was performed following the standard protocol without intravenous contrast. Multiplanar CT image reconstructions of the cervical spine were also generated. RADIATION DOSE REDUCTION: This exam was performed according to the departmental dose-optimization program which includes automated exposure control, adjustment of the mA and/or kV according to patient size and/or use of iterative reconstruction technique. COMPARISON:  None Available. FINDINGS: CT HEAD FINDINGS Brain: No acute intracranial hemorrhage. No CT evidence of acute infarct. No edema, mass effect, or midline shift. The basilar cisterns are patent. Ventricles: The ventricles are normal. Vascular: No hyperdense vessel or unexpected calcification. Skull: No acute or aggressive finding. Orbits: Orbits are symmetric. Sinuses: Mucosal thickening in the ethmoid sinuses and bilateral maxillary sinuses. Other: Mastoid air cells are clear. CT CERVICAL SPINE FINDINGS Alignment: Straightening of the normal cervical lordosis. Trace anterolisthesis of C4 on C5. No facet subluxation or  dislocation. Skull base and vertebrae: No compression fracture or displaced fracture in the cervical spine. No suspicious osseous lesion. Soft tissues and spinal canal: No prevertebral fluid or swelling. No visible canal hematoma. Disc levels: Intervertebral disc spaces are maintained. Degenerative endplate osteophytes at multiple levels. No evidence of high-grade spinal canal stenosis. Facet arthrosis at multiple levels throughout the  cervical spine. Upper chest: Negative. Other: None. IMPRESSION: 1. No acute intracranial abnormality. 2. No acute fracture or traumatic malalignment of the cervical spine. 3. Multilevel degenerative changes in the cervical spine. Electronically Signed   By: Emily Filbert M.D.   On: 10/29/2023 15:48   CT Cervical Spine Wo Contrast Result Date: 10/29/2023 CLINICAL DATA:  Trauma, fall, landed on left shoulder. EXAM: CT HEAD WITHOUT CONTRAST CT CERVICAL SPINE WITHOUT CONTRAST TECHNIQUE: Multidetector CT imaging of the head and cervical spine was performed following the standard protocol without intravenous contrast. Multiplanar CT image reconstructions of the cervical spine were also generated. RADIATION DOSE REDUCTION: This exam was performed according to the departmental dose-optimization program which includes automated exposure control, adjustment of the mA and/or kV according to patient size and/or use of iterative reconstruction technique. COMPARISON:  None Available. FINDINGS: CT HEAD FINDINGS Brain: No acute intracranial hemorrhage. No CT evidence of acute infarct. No edema, mass effect, or midline shift. The basilar cisterns are patent. Ventricles: The ventricles are normal. Vascular: No hyperdense vessel or unexpected calcification. Skull: No acute or aggressive finding. Orbits: Orbits are symmetric. Sinuses: Mucosal thickening in the ethmoid sinuses and bilateral maxillary sinuses. Other: Mastoid air cells are clear. CT CERVICAL SPINE FINDINGS Alignment: Straightening of the  normal cervical lordosis. Trace anterolisthesis of C4 on C5. No facet subluxation or dislocation. Skull base and vertebrae: No compression fracture or displaced fracture in the cervical spine. No suspicious osseous lesion. Soft tissues and spinal canal: No prevertebral fluid or swelling. No visible canal hematoma. Disc levels: Intervertebral disc spaces are maintained. Degenerative endplate osteophytes at multiple levels. No evidence of high-grade spinal canal stenosis. Facet arthrosis at multiple levels throughout the cervical spine. Upper chest: Negative. Other: None. IMPRESSION: 1. No acute intracranial abnormality. 2. No acute fracture or traumatic malalignment of the cervical spine. 3. Multilevel degenerative changes in the cervical spine. Electronically Signed   By: Emily Filbert M.D.   On: 10/29/2023 15:48   DG Hand Complete Left Result Date: 10/29/2023 CLINICAL DATA:  Pain after fall. EXAM: LEFT HAND - COMPLETE 3+ VIEW COMPARISON:  None Available. FINDINGS: Irregularity of the thumb distal phalanx is suspicious for nondisplaced fracture. This extends to the interphalangeal joint articular surface. No other fracture of the hand. Osteoarthritis involving the digits and thumb carpal metacarpal joint. The bones are diffusely under mineralized. No definite focal soft tissue abnormalities. IMPRESSION: 1. Irregularity of the thumb distal phalanx is suspicious for nondisplaced fracture. This extends to the interphalangeal joint articular surface. 2. Osteoarthritis. Electronically Signed   By: Narda Rutherford M.D.   On: 10/29/2023 15:34   DG Shoulder Left Result Date: 10/29/2023 CLINICAL DATA:  Lost balance leading to fall. Unable to move left shoulder. EXAM: LEFT SHOULDER - 2+ VIEW; LEFT HUMERUS - 2+ VIEW COMPARISON:  None Available. FINDINGS: Shoulder: Comminuted displaced proximal humerus fracture. The humeral head likely remains congruent with the glenoid, however there is a significant rotational  component. Suspect apex lateral angulation. There is a displaced fragment involving the lateral humeral head. The humeral shaft appears to be displaced anteriorly. CT is planned for further assessment. The acromioclavicular joint is congruent. Humerus: The distal humerus is intact. Elbow alignment is poorly assessed on provided views. IMPRESSION: 1. Comminuted displaced proximal humerus fracture. The humeral head likely remains congruent with the glenoid, however there is a significant rotational component in the humeral shaft appears to be displaced anteriorly. 2. No distal humerus fracture. Electronically Signed   By: Ivette Loyal.D.  On: 10/29/2023 15:32   DG Humerus Left Result Date: 10/29/2023 CLINICAL DATA:  Lost balance leading to fall. Unable to move left shoulder. EXAM: LEFT SHOULDER - 2+ VIEW; LEFT HUMERUS - 2+ VIEW COMPARISON:  None Available. FINDINGS: Shoulder: Comminuted displaced proximal humerus fracture. The humeral head likely remains congruent with the glenoid, however there is a significant rotational component. Suspect apex lateral angulation. There is a displaced fragment involving the lateral humeral head. The humeral shaft appears to be displaced anteriorly. CT is planned for further assessment. The acromioclavicular joint is congruent. Humerus: The distal humerus is intact. Elbow alignment is poorly assessed on provided views. IMPRESSION: 1. Comminuted displaced proximal humerus fracture. The humeral head likely remains congruent with the glenoid, however there is a significant rotational component in the humeral shaft appears to be displaced anteriorly. 2. No distal humerus fracture. Electronically Signed   By: Narda Rutherford M.D.   On: 10/29/2023 15:32    Review of Systems  HENT:  Negative for ear discharge, ear pain, hearing loss and tinnitus.   Eyes:  Negative for photophobia and pain.  Respiratory:  Negative for cough and shortness of breath.   Cardiovascular:   Negative for chest pain.  Gastrointestinal:  Negative for abdominal pain, nausea and vomiting.  Genitourinary:  Negative for dysuria, flank pain, frequency and urgency.  Musculoskeletal:  Positive for arthralgias (Left shouler, left thumb). Negative for back pain, myalgias and neck pain.  Neurological:  Negative for dizziness and headaches.  Hematological:  Does not bruise/bleed easily.  Psychiatric/Behavioral:  The patient is not nervous/anxious.    Blood pressure 139/74, pulse 88, temperature 97.6 F (36.4 C), temperature source Oral, resp. rate 17, height 5\' 7"  (1.702 m), weight 104.3 kg, SpO2 99%. Physical Exam Constitutional:      General: She is not in acute distress.    Appearance: She is well-developed. She is not diaphoretic.  HENT:     Head: Normocephalic and atraumatic.  Eyes:     General: No scleral icterus.       Right eye: No discharge.        Left eye: No discharge.     Conjunctiva/sclera: Conjunctivae normal.  Cardiovascular:     Rate and Rhythm: Normal rate and regular rhythm.  Pulmonary:     Effort: Pulmonary effort is normal. No respiratory distress.  Musculoskeletal:     Cervical back: Normal range of motion.     Comments: Left shoulder, elbow, wrist, digits- no skin wounds, severe TTP shoulder, mod TTP thumb, no instability, no blocks to motion  Sens  Ax/R/M/U intact  Mot   Ax/ R/ PIN/ M/ AIN/ U intact  Rad 2+  Skin:    General: Skin is warm and dry.  Neurological:     Mental Status: She is alert.  Psychiatric:        Mood and Affect: Mood normal.        Behavior: Behavior normal.     Assessment/Plan: Left humerus fx -- Plan reverse total shoulder tomorrow with Dr. Everardo Pacific at [facility]. Please keep NPO after MN. Left thumb fx -- Will place in thumb spica removable splint.    Freeman Caldron, PA-C Orthopedic Surgery [phone] 10/29/2023, 3:56 PM

## 2023-10-29 NOTE — ED Notes (Addendum)
 Ortho tech called to place immobilizer sling

## 2023-10-29 NOTE — Plan of Care (Signed)

## 2023-10-29 NOTE — ED Notes (Signed)
 Ortho at bedside.

## 2023-10-29 NOTE — Progress Notes (Signed)
 Orthopedic Tech Progress Note Patient Details:  Jade Sanchez 09-05-1940 [id number]  Ortho Devices Type of Ortho Device: Sling immobilizer, Thumb velcro splint Ortho Device/Splint Location: LUE Ortho Device/Splint Interventions: Ordered, Application, Adjustment  Added fresh ice to shoulder Post Interventions Patient Tolerated: Poor, Fair Instructions Provided: Care of device  Donald Pore 10/29/2023, 4:17 PM

## 2023-10-30 ENCOUNTER — Inpatient Hospital Stay (HOSPITAL_COMMUNITY): Admitting: Anesthesiology

## 2023-10-30 ENCOUNTER — Inpatient Hospital Stay (HOSPITAL_COMMUNITY)

## 2023-10-30 ENCOUNTER — Encounter (HOSPITAL_COMMUNITY): Payer: Self-pay | Admitting: Internal Medicine

## 2023-10-30 ENCOUNTER — Encounter (HOSPITAL_COMMUNITY)
Admission: EM | Disposition: A | Discharge status: Home Health | Payer: Self-pay | Source: Home / Self Care | Attending: Family Medicine

## 2023-10-30 DIAGNOSIS — Z87891 Personal history of nicotine dependence: Secondary | ICD-10-CM | POA: Diagnosis not present

## 2023-10-30 DIAGNOSIS — I10 Essential (primary) hypertension: Secondary | ICD-10-CM

## 2023-10-30 DIAGNOSIS — S42202A Unspecified fracture of upper end of left humerus, initial encounter for closed fracture: Secondary | ICD-10-CM | POA: Diagnosis not present

## 2023-10-30 DIAGNOSIS — S62502A Fracture of unspecified phalanx of left thumb, initial encounter for closed fracture: Secondary | ICD-10-CM | POA: Diagnosis not present

## 2023-10-30 DIAGNOSIS — E210 Primary hyperparathyroidism: Secondary | ICD-10-CM | POA: Diagnosis not present

## 2023-10-30 DIAGNOSIS — S42202P Unspecified fracture of upper end of left humerus, subsequent encounter for fracture with malunion: Secondary | ICD-10-CM | POA: Diagnosis not present

## 2023-10-30 DIAGNOSIS — E119 Type 2 diabetes mellitus without complications: Secondary | ICD-10-CM | POA: Diagnosis not present

## 2023-10-30 HISTORY — PX: ORIF SHOULDER FRACTURE: SHX5035

## 2023-10-30 HISTORY — PX: REVERSE SHOULDER ARTHROPLASTY: SHX5054

## 2023-10-30 LAB — URINALYSIS, ROUTINE W REFLEX MICROSCOPIC
Bilirubin Urine: NEGATIVE
Glucose, UA: 150 mg/dL — AB
Hgb urine dipstick: NEGATIVE
Ketones, ur: NEGATIVE mg/dL
Leukocytes,Ua: NEGATIVE
Nitrite: NEGATIVE
Protein, ur: NEGATIVE mg/dL
Specific Gravity, Urine: 1.011 (ref 1.005–1.030)
pH: 5 (ref 5.0–8.0)

## 2023-10-30 LAB — CBC
HCT: 40.1 % (ref 36.0–46.0)
HCT: 38.3 % (ref 36.0–46.0)
Hemoglobin: 13.4 g/dL (ref 12.0–15.0)
Hemoglobin: 12 g/dL (ref 12.0–15.0)
MCH: 30.6 pg (ref 26.0–34.0)
MCH: 30.1 pg (ref 26.0–34.0)
MCHC: 33.4 g/dL (ref 30.0–36.0)
MCHC: 31.3 g/dL (ref 30.0–36.0)
MCV: 91.6 fL (ref 80.0–100.0)
MCV: 96 fL (ref 80.0–100.0)
Platelets: 262 10*3/uL (ref 150–400)
Platelets: 232 10*3/uL (ref 150–400)
RBC: 4.38 MIL/uL (ref 3.87–5.11)
RBC: 3.99 MIL/uL (ref 3.87–5.11)
RDW: 13.3 % (ref 11.5–15.5)
RDW: 13.4 % (ref 11.5–15.5)
WBC: 11.2 10*3/uL — ABNORMAL HIGH (ref 4.0–10.5)
WBC: 13 10*3/uL — ABNORMAL HIGH (ref 4.0–10.5)
nRBC: 0 % (ref 0.0–0.2)
nRBC: 0 % (ref 0.0–0.2)

## 2023-10-30 LAB — GLUCOSE, CAPILLARY
Glucose-Capillary: 121 mg/dL — ABNORMAL HIGH (ref 70–99)
Glucose-Capillary: 92 mg/dL (ref 70–99)
Glucose-Capillary: 123 mg/dL — ABNORMAL HIGH (ref 70–99)
Glucose-Capillary: 151 mg/dL — ABNORMAL HIGH (ref 70–99)
Glucose-Capillary: 210 mg/dL — ABNORMAL HIGH (ref 70–99)

## 2023-10-30 LAB — BASIC METABOLIC PANEL
Anion gap: 4 — ABNORMAL LOW (ref 5–15)
BUN: 15 mg/dL (ref 8–23)
CO2: 24 mmol/L (ref 22–32)
Calcium: 10.2 mg/dL (ref 8.9–10.3)
Chloride: 106 mmol/L (ref 98–111)
Creatinine, Ser: 1.23 mg/dL — ABNORMAL HIGH (ref 0.44–1.00)
GFR, Estimated: 44 mL/min — ABNORMAL LOW (ref >60)
Glucose, Bld: 146 mg/dL — ABNORMAL HIGH (ref 70–99)
Potassium: 4.2 mmol/L (ref 3.5–5.1)
Sodium: 134 mmol/L — ABNORMAL LOW (ref 135–145)

## 2023-10-30 LAB — SURGICAL PCR SCREEN
MRSA, PCR: NEGATIVE
Staphylococcus aureus: POSITIVE — AB

## 2023-10-30 LAB — CREATININE, SERUM
Creatinine, Ser: 0.92 mg/dL (ref 0.44–1.00)
GFR, Estimated: >60 mL/min (ref >60)

## 2023-10-30 SURGERY — ARTHROPLASTY, SHOULDER, TOTAL, REVERSE
Anesthesia: General | Site: Shoulder | Laterality: Left

## 2023-10-30 MED ORDER — ACETAMINOPHEN 325 MG PO TABS: 325.0000 mg | ORAL_TABLET | Freq: Four times a day (QID) | ORAL | Status: DC | PRN

## 2023-10-30 MED ORDER — SODIUM CHLORIDE 0.9 % IR SOLN
Status: DC | PRN
  Administered 2023-10-30: 3000 mL

## 2023-10-30 MED ORDER — ALBUMIN HUMAN 5 % IV SOLN
INTRAVENOUS | Status: AC
  Filled 2023-10-30: qty 250

## 2023-10-30 MED ORDER — METOCLOPRAMIDE HCL 5 MG/ML IJ SOLN
5.0000 mg | Freq: Three times a day (TID) | INTRAMUSCULAR | Status: DC | PRN
  Administered 2023-10-31: 10 mg via INTRAVENOUS
  Filled 2023-10-30: qty 2

## 2023-10-30 MED ORDER — LACTATED RINGERS IV SOLN: INTRAVENOUS | Status: DC

## 2023-10-30 MED ORDER — LORATADINE 10 MG PO TABS
10.0000 mg | ORAL_TABLET | Freq: Every day | ORAL | Status: DC
  Administered 2023-10-31 – 2023-11-01 (×2): 10 mg via ORAL
  Filled 2023-10-30 (×2): qty 1

## 2023-10-30 MED ORDER — PHENYLEPHRINE HCL-NACL 20-0.9 MG/250ML-% IV SOLN
INTRAVENOUS | Status: AC
  Filled 2023-10-30: qty 250

## 2023-10-30 MED ORDER — CHLORHEXIDINE GLUCONATE 0.12 % MT SOLN
15.0000 mL | Freq: Once | OROMUCOSAL | Status: AC
  Administered 2023-10-30: 15 mL via OROMUCOSAL

## 2023-10-30 MED ORDER — SODIUM CHLORIDE 0.9 % IV SOLN
INTRAVENOUS | Status: DC
Start: 2023-10-30 — End: 2023-10-31

## 2023-10-30 MED ORDER — VANCOMYCIN HCL 1000 MG IV SOLR
INTRAVENOUS | Status: AC
  Filled 2023-10-30: qty 20

## 2023-10-30 MED ORDER — ROSUVASTATIN CALCIUM 5 MG PO TABS: 5.0000 mg | ORAL_TABLET | Freq: Every evening | ORAL | Status: DC

## 2023-10-30 MED ORDER — 0.9 % SODIUM CHLORIDE (POUR BTL) OPTIME
TOPICAL | Status: DC | PRN
  Administered 2023-10-30: 1000 mL

## 2023-10-30 MED ORDER — POVIDONE-IODINE 10 % EX SWAB
2.0000 | Freq: Once | CUTANEOUS | Status: AC
  Administered 2023-10-30: 2 via TOPICAL

## 2023-10-30 MED ORDER — ACETAMINOPHEN 500 MG PO TABS
500.0000 mg | ORAL_TABLET | Freq: Four times a day (QID) | ORAL | Status: AC
  Administered 2023-10-30 – 2023-10-31 (×2): 500 mg via ORAL
  Filled 2023-10-30 (×2): qty 1

## 2023-10-30 MED ORDER — PROPOFOL 10 MG/ML IV BOLUS
INTRAVENOUS | Status: AC
  Filled 2023-10-30: qty 20

## 2023-10-30 MED ORDER — ONDANSETRON HCL 4 MG/2ML IJ SOLN
INTRAMUSCULAR | Status: AC
  Filled 2023-10-30: qty 2

## 2023-10-30 MED ORDER — FENTANYL CITRATE (PF) 100 MCG/2ML IJ SOLN
INTRAMUSCULAR | Status: AC
  Filled 2023-10-30: qty 2

## 2023-10-30 MED ORDER — TRANEXAMIC ACID-NACL 1000-0.7 MG/100ML-% IV SOLN
INTRAVENOUS | Status: DC | PRN
  Administered 2023-10-30: 1000 mg via INTRAVENOUS

## 2023-10-30 MED ORDER — METOCLOPRAMIDE HCL 5 MG PO TABS: 5.0000 mg | ORAL_TABLET | Freq: Three times a day (TID) | ORAL | Status: DC | PRN

## 2023-10-30 MED ORDER — SUGAMMADEX SODIUM 200 MG/2ML IV SOLN
INTRAVENOUS | Status: DC | PRN
Start: 2023-10-30 — End: 2023-10-30
  Administered 2023-10-30: 250 mg via INTRAVENOUS

## 2023-10-30 MED ORDER — ENOXAPARIN SODIUM 40 MG/0.4ML IJ SOSY
40.0000 mg | PREFILLED_SYRINGE | INTRAMUSCULAR | Status: DC
  Administered 2023-10-31 – 2023-11-01 (×2): 40 mg via SUBCUTANEOUS
  Filled 2023-10-30 (×2): qty 0.4

## 2023-10-30 MED ORDER — TRANEXAMIC ACID-NACL 1000-0.7 MG/100ML-% IV SOLN
INTRAVENOUS | Status: AC
Start: 2023-10-30 — End: 2023-10-30
  Filled 2023-10-30: qty 100

## 2023-10-30 MED ORDER — DEXAMETHASONE SODIUM PHOSPHATE 10 MG/ML IJ SOLN
INTRAMUSCULAR | Status: AC
  Filled 2023-10-30: qty 1

## 2023-10-30 MED ORDER — FENTANYL CITRATE PF 50 MCG/ML IJ SOSY
50.0000 ug | PREFILLED_SYRINGE | Freq: Once | INTRAMUSCULAR | Status: AC
  Administered 2023-10-30: 50 ug via INTRAVENOUS
  Filled 2023-10-30: qty 1

## 2023-10-30 MED ORDER — CEFAZOLIN SODIUM-DEXTROSE 2-4 GM/100ML-% IV SOLN
2.0000 g | Freq: Four times a day (QID) | INTRAVENOUS | Status: AC
  Administered 2023-10-30 – 2023-10-31 (×3): 2 g via INTRAVENOUS
  Filled 2023-10-30 (×3): qty 100

## 2023-10-30 MED ORDER — ROCURONIUM BROMIDE 10 MG/ML (PF) SYRINGE
PREFILLED_SYRINGE | INTRAVENOUS | Status: DC | PRN
  Administered 2023-10-30: 70 mg via INTRAVENOUS

## 2023-10-30 MED ORDER — CHLORHEXIDINE GLUCONATE 4 % EX SOLN: 60.0000 mL | Freq: Once | CUTANEOUS | Status: DC

## 2023-10-30 MED ORDER — ONDANSETRON HCL 4 MG PO TABS
4.0000 mg | ORAL_TABLET | Freq: Four times a day (QID) | ORAL | Status: DC | PRN
  Administered 2023-11-01: 4 mg via ORAL
  Filled 2023-10-30 (×2): qty 1

## 2023-10-30 MED ORDER — DOCUSATE SODIUM 100 MG PO CAPS
100.0000 mg | ORAL_CAPSULE | Freq: Two times a day (BID) | ORAL | Status: DC
  Administered 2023-10-30 – 2023-11-01 (×4): 100 mg via ORAL
  Filled 2023-10-30 (×4): qty 1

## 2023-10-30 MED ORDER — DEXAMETHASONE SODIUM PHOSPHATE 10 MG/ML IJ SOLN
INTRAMUSCULAR | Status: DC | PRN
  Administered 2023-10-30: 5 mg via INTRAVENOUS

## 2023-10-30 MED ORDER — ROCURONIUM BROMIDE 10 MG/ML (PF) SYRINGE
PREFILLED_SYRINGE | INTRAVENOUS | Status: AC
Start: 2023-10-30 — End: ?
  Filled 2023-10-30: qty 10

## 2023-10-30 MED ORDER — MORPHINE SULFATE (PF) 2 MG/ML IV SOLN
0.5000 mg | INTRAVENOUS | Status: DC | PRN
  Administered 2023-10-31 – 2023-11-01 (×2): 1 mg via INTRAVENOUS
  Filled 2023-10-30 (×2): qty 1

## 2023-10-30 MED ORDER — CEFAZOLIN SODIUM-DEXTROSE 2-4 GM/100ML-% IV SOLN
2.0000 g | INTRAVENOUS | Status: AC
  Administered 2023-10-30: 2 g via INTRAVENOUS
  Filled 2023-10-30: qty 100

## 2023-10-30 MED ORDER — ACETAMINOPHEN 10 MG/ML IV SOLN: 1000.0000 mg | Freq: Once | INTRAVENOUS | Status: DC | PRN

## 2023-10-30 MED ORDER — ONDANSETRON HCL 4 MG/2ML IJ SOLN: 4.0000 mg | Freq: Four times a day (QID) | INTRAMUSCULAR | Status: DC | PRN

## 2023-10-30 MED ORDER — FENTANYL CITRATE PF 50 MCG/ML IJ SOSY: 25.0000 ug | PREFILLED_SYRINGE | INTRAMUSCULAR | Status: DC | PRN

## 2023-10-30 MED ORDER — HYDROCODONE-ACETAMINOPHEN 5-325 MG PO TABS
1.0000 | ORAL_TABLET | ORAL | Status: DC | PRN
  Administered 2023-10-31 – 2023-11-01 (×3): 2 via ORAL
  Filled 2023-10-30 (×3): qty 2

## 2023-10-30 MED ORDER — PROPOFOL 10 MG/ML IV BOLUS
INTRAVENOUS | Status: DC | PRN
  Administered 2023-10-30: 120 mg via INTRAVENOUS

## 2023-10-30 MED ORDER — HYDROCODONE-ACETAMINOPHEN 7.5-325 MG PO TABS
1.0000 | ORAL_TABLET | ORAL | Status: DC | PRN
  Administered 2023-10-31: 1 via ORAL
  Administered 2023-11-01 (×2): 2 via ORAL
  Filled 2023-10-30 (×2): qty 2
  Filled 2023-10-30: qty 1

## 2023-10-30 MED ORDER — CHLORHEXIDINE GLUCONATE CLOTH 2 % EX PADS
6.0000 | MEDICATED_PAD | Freq: Every day | CUTANEOUS | Status: DC
  Administered 2023-10-30 – 2023-10-31 (×2): 6 via TOPICAL

## 2023-10-30 MED ORDER — ONDANSETRON HCL 4 MG/2ML IJ SOLN: 4.0000 mg | Freq: Once | INTRAMUSCULAR | Status: DC | PRN

## 2023-10-30 MED ORDER — ALBUMIN HUMAN 5 % IV SOLN: INTRAVENOUS | Status: DC | PRN

## 2023-10-30 MED ORDER — VITAMIN D 25 MCG (1000 UNIT) PO TABS
2000.0000 [IU] | ORAL_TABLET | Freq: Every day | ORAL | Status: DC
  Administered 2023-10-31 – 2023-11-01 (×2): 2000 [IU] via ORAL
  Filled 2023-10-30 (×2): qty 2

## 2023-10-30 MED ORDER — IRBESARTAN 300 MG PO TABS
300.0000 mg | ORAL_TABLET | Freq: Every day | ORAL | Status: DC
  Administered 2023-10-31 – 2023-11-01 (×2): 300 mg via ORAL
  Filled 2023-10-30: qty 2
  Filled 2023-10-30 (×2): qty 1
  Filled 2023-10-30 (×2): qty 2
  Filled 2023-10-30: qty 1

## 2023-10-30 MED ORDER — PHENYLEPHRINE HCL-NACL 20-0.9 MG/250ML-% IV SOLN
INTRAVENOUS | Status: DC | PRN
Start: 2023-10-30 — End: 2023-10-30
  Administered 2023-10-30: 50 ug/min via INTRAVENOUS

## 2023-10-30 MED ORDER — INSULIN ASPART 100 UNIT/ML IJ SOLN: 0.0000 [IU] | INTRAMUSCULAR | Status: DC | PRN

## 2023-10-30 MED ORDER — ROSUVASTATIN CALCIUM 5 MG PO TABS
5.0000 mg | ORAL_TABLET | Freq: Every day | ORAL | Status: DC
  Administered 2023-10-31 – 2023-11-01 (×2): 5 mg via ORAL
  Filled 2023-10-30 (×2): qty 1

## 2023-10-30 MED ORDER — ONDANSETRON HCL 4 MG/2ML IJ SOLN
INTRAMUSCULAR | Status: DC | PRN
  Administered 2023-10-30: 4 mg via INTRAVENOUS

## 2023-10-30 MED ORDER — BUPIVACAINE LIPOSOME 1.3 % IJ SUSP
INTRAMUSCULAR | Status: DC | PRN
  Administered 2023-10-30: 10 mL

## 2023-10-30 MED ORDER — MUPIROCIN 2 % EX OINT
1.0000 | TOPICAL_OINTMENT | Freq: Two times a day (BID) | CUTANEOUS | Status: DC
  Administered 2023-10-31 – 2023-11-01 (×4): 1 via NASAL
  Filled 2023-10-30 (×3): qty 22

## 2023-10-30 MED ORDER — INSULIN ASPART 100 UNIT/ML IJ SOLN: 0.0000 [IU] | Freq: Four times a day (QID) | INTRAMUSCULAR | Status: DC

## 2023-10-30 MED ORDER — VANCOMYCIN HCL 1 G IV SOLR
INTRAVENOUS | Status: DC | PRN
  Administered 2023-10-30: 1000 mg

## 2023-10-30 MED ORDER — DIPHENHYDRAMINE HCL 12.5 MG/5ML PO ELIX: 12.5000 mg | ORAL_SOLUTION | ORAL | Status: DC | PRN

## 2023-10-30 MED ORDER — FENTANYL CITRATE (PF) 100 MCG/2ML IJ SOLN
INTRAMUSCULAR | Status: DC | PRN
  Administered 2023-10-30: 50 ug via INTRAVENOUS

## 2023-10-30 MED ORDER — STERILE WATER FOR IRRIGATION IR SOLN
Status: DC | PRN
  Administered 2023-10-30: 1000 mL

## 2023-10-30 MED ORDER — LIDOCAINE HCL (PF) 2 % IJ SOLN
INTRAMUSCULAR | Status: DC | PRN
  Administered 2023-10-30: 80 mg via INTRADERMAL

## 2023-10-30 MED ORDER — BUPIVACAINE HCL (PF) 0.5 % IJ SOLN
INTRAMUSCULAR | Status: DC | PRN
  Administered 2023-10-30: 15 mL via PERINEURAL

## 2023-10-30 SURGICAL SUPPLY — 59 items
BAG COUNTER SPONGE SURGICOUNT (BAG) ×1 IMPLANT
BASEPLATE GLENOID RSA 3X25 0D (Shoulder) IMPLANT
BIT DRILL 3.2 PERIPHERAL SCREW (BIT) IMPLANT
BLADE SAW SGTL 73X25 THK (BLADE) ×1 IMPLANT
CHLORAPREP W/TINT 26 (MISCELLANEOUS) ×2 IMPLANT
CLSR STERI-STRIP ANTIMIC 1/2X4 (GAUZE/BANDAGES/DRESSINGS) ×1 IMPLANT
COOLER ICEMAN CLASSIC (MISCELLANEOUS) IMPLANT
COVER BACK TABLE 60X90IN (DRAPES) ×1 IMPLANT
COVER SURGICAL LIGHT HANDLE (MISCELLANEOUS) ×1 IMPLANT
CUP HUM REV SHLD 3/4 39 +3 (Cup) IMPLANT
DRAPE C-ARM 42X120 X-RAY (DRAPES) IMPLANT
DRAPE INCISE IOBAN 66X45 STRL (DRAPES) ×1 IMPLANT
DRAPE POUCH INSTRU U-SHP 10X18 (DRAPES) ×1 IMPLANT
DRAPE SHEET LG 3/4 BI-LAMINATE (DRAPES) ×2 IMPLANT
DRAPE SURG ORHT 6 SPLT 77X108 (DRAPES) ×2 IMPLANT
DRSG AQUACEL AG ADV 3.5X 6 (GAUZE/BANDAGES/DRESSINGS) ×1 IMPLANT
ELECT BLADE TIP CTD 4 INCH (ELECTRODE) ×1 IMPLANT
ELECT PENCIL ROCKER SW 15FT (MISCELLANEOUS) IMPLANT
ELECT REM PT RETURN 15FT ADLT (MISCELLANEOUS) ×1 IMPLANT
FACESHIELD WRAPAROUND (MASK) ×3 IMPLANT
FACESHIELD WRAPAROUND OR TEAM (MASK) ×3 IMPLANT
GLENOSPHERE STANDARD 39 (Joint) ×1 IMPLANT
GLENOSPHERE STD 39 (Joint) IMPLANT
GLOVE BIO SURGEON STRL SZ 6.5 (GLOVE) ×2 IMPLANT
GLOVE BIOGEL PI IND STRL 6.5 (GLOVE) ×1 IMPLANT
GLOVE BIOGEL PI IND STRL 8 (GLOVE) ×1 IMPLANT
GLOVE ECLIPSE 8.0 STRL XLNG CF (GLOVE) ×2 IMPLANT
GOWN STRL REUS W/ TWL LRG LVL3 (GOWN DISPOSABLE) ×2 IMPLANT
GUIDEWIRE GLENOID 2.5X220 (WIRE) IMPLANT
KIT BASIN OR (CUSTOM PROCEDURE TRAY) ×1 IMPLANT
KIT STABILIZATION SHOULDER (MISCELLANEOUS) ×1 IMPLANT
KIT TURNOVER KIT A (KITS) IMPLANT
MANIFOLD NEPTUNE II (INSTRUMENTS) ×1 IMPLANT
NDL MAYO 6 CRC TAPER PT (NEEDLE) IMPLANT
NEEDLE MAYO 6 CRC TAPER PT (NEEDLE) ×1 IMPLANT
NS IRRIG 1000ML POUR BTL (IV SOLUTION) ×1 IMPLANT
PACK SHOULDER (CUSTOM PROCEDURE TRAY) ×1 IMPLANT
PAD COLD SHLDR WRAP-ON (PAD) IMPLANT
RESTRAINT HEAD UNIVERSAL NS (MISCELLANEOUS) ×1 IMPLANT
SCREW 5.0X18 (Screw) IMPLANT
SCREW 5.5X26 (Screw) IMPLANT
SCREW BONE 6.5X40 SM (Screw) IMPLANT
SCREW HUM PERFORM 32 (Anchor) IMPLANT
SET HNDPC FAN SPRY TIP SCT (DISPOSABLE) ×1 IMPLANT
SLING ARM IMMOBILIZER LRG (SOFTGOODS) IMPLANT
SPONGE T-LAP 4X18 ~~LOC~~+RFID (SPONGE) ×1 IMPLANT
STEM PERFORM FX 13X130 LT (Stem) IMPLANT
STRIP CLOSURE SKIN 1/2X4 (GAUZE/BANDAGES/DRESSINGS) IMPLANT
SUCTION TUBE FRAZIER 12FR DISP (SUCTIONS) IMPLANT
SUT ETHIBOND 2 V 37 (SUTURE) ×1 IMPLANT
SUT ETHIBOND NAB CT1 #1 30IN (SUTURE) ×1 IMPLANT
SUT FIBERWIRE #5 38 CONV NDL (SUTURE) ×2 IMPLANT
SUT MNCRL AB 4-0 PS2 18 (SUTURE) ×1 IMPLANT
SUT VIC AB 0 CT1 36 (SUTURE) IMPLANT
SUT VIC AB 3-0 SH 27X BRD (SUTURE) ×1 IMPLANT
SUTURE FIBERWR #5 38 CONV NDL (SUTURE) ×2 IMPLANT
TOWEL OR 17X26 10 PK STRL BLUE (TOWEL DISPOSABLE) ×1 IMPLANT
TUBE SUCTION HIGH CAP CLEAR NV (SUCTIONS) ×1 IMPLANT
WATER STERILE IRR 1000ML POUR (IV SOLUTION) ×2 IMPLANT

## 2023-10-30 NOTE — Progress Notes (Signed)
 Transition of Care [facility]) - CAGE-AID Screening   Patient Details  Name: Jade Sanchez MRN: [id number] Date of Birth: Mar 30, 1941  [facility] CM/SW Contact:    Leota Sauers, RN Phone Number: 10/30/2023, 6:29 AM   Clinical Narrative:  Patient denies the use of alcohol and illicit substances. Resources not given at this time.  CAGE-AID Screening:    Have You Ever Felt You Ought to Cut Down on Your Drinking or Drug Use?: No Have People Annoyed You By Critizing Your Drinking Or Drug Use?: No Have You Felt Bad Or Guilty About Your Drinking Or Drug Use?: No Have You Ever Had a Drink or Used Drugs First Thing In The Morning to Steady Your Nerves or to Get Rid of a Hangover?: No CAGE-AID Score: 0  Substance Abuse Education Offered: No

## 2023-10-30 NOTE — Anesthesia Procedure Notes (Signed)
 Procedure Name: Intubation Date/Time: 10/30/2023 2:01 PM  Performed by: Madeleine Fenn D, CRNAPre-anesthesia Checklist: Patient identified, Emergency Drugs available, Suction available and Patient being monitored Patient Re-evaluated:Patient Re-evaluated prior to induction Oxygen Delivery Method: Circle system utilized Preoxygenation: Pre-oxygenation with 100% oxygen Induction Type: IV induction Ventilation: Mask ventilation without difficulty Laryngoscope Size: Mac and 3 Grade View: Grade I Tube type: Oral Tube size: 7.0 mm Number of attempts: 1 Airway Equipment and Method: Stylet and Oral airway Placement Confirmation: ETT inserted through vocal cords under direct vision, positive ETCO2 and breath sounds checked- equal and bilateral Secured at: 21 cm Tube secured with: Tape Dental Injury: Teeth and Oropharynx as per pre-operative assessment

## 2023-10-30 NOTE — Anesthesia Procedure Notes (Signed)
 Anesthesia Regional Block: Interscalene brachial plexus block   Pre-Anesthetic Checklist: , timeout performed,  Correct Patient, Correct Site, Correct Laterality,  Correct Procedure, Correct Position, site marked,  Risks and benefits discussed,  Surgical consent,  Pre-op evaluation,  At surgeon's request and post-op pain management  Laterality: Upper and Left  Prep: Maximum Sterile Barrier Precautions used, chloraprep       Needles:  Injection technique: Single-shot  Needle Type: Echogenic Needle     Needle Length: 5cm  Needle Gauge: 21     Additional Needles:   Procedures:,,,, ultrasound used (permanent image in chart),,    Narrative:  Start time: 10/30/2023 12:50 PM End time: 10/30/2023 12:56 PM Injection made incrementally with aspirations every 5 mL.  Performed by: Personally  Anesthesiologist: Trevor Iha, MD  Additional Notes: Block assessed prior to procedure. Patient tolerated procedure well.

## 2023-10-30 NOTE — Progress Notes (Addendum)
 Called pt placement for [facility] about pt transfer status. Rep said there is no bed available for pt to be moved. Until there is a bed available at [facility], pt will remain in 5N12. Charma Igo, LPN 1:61 AM 09/60/45

## 2023-10-30 NOTE — Progress Notes (Signed)
 PROGRESS NOTE    Jade Sanchez  ZOX:[id number] DOB: 23-Nov-1940 DOA: 10/29/2023 PCP: Emilio Aspen, MD   Chief Complaint  Patient presents with   Fall    Brief Narrative:  Patient pleasant 83 year old female history of hypertension, hyperlipidemia, diabetes mellitus type 2, hyperparathyroidism, arthritis presented after having a fall.  Patient was attending a class at the senior center attempted to step over wheels of a large bulletin board of rollers foot got caught causing a fall.  Patient tried to catch herself by placing her hand on the board but rolled away did not hit her head during the fall but denied any significant pain in the head.  Patient with immediate pain and severe in the left shoulder since the incident and presented to the ED.  Patient seen in the ED plain films of the left shoulder revealed comminuted displaced proximal humerus fracture, x-rays of the left hand revealed irregularity of the thumb distal phalanx suspicious for nondisplaced fracture.  Orthopedics consulted and patient to be transferred to [facility] long hospital for total shoulder replacement today, 10/30/2023.   Assessment & Plan:   Principal Problem:   Proximal humerus fracture Active Problems:   Fall   Thumb fracture   AKI (acute kidney injury) (HCC)   Essential hypertension   Primary hyperparathyroidism (HCC)   Hypercalcemia   Type 2 diabetes mellitus (HCC)   Hypercholesterolemia   Obesity (BMI 30-39.9)   #1 left proximal humerus and left arm fracture secondary to mechanical fall -Secondary to mechanical fall sustained at senior center after tripping over a rolling bulletin board injuring left shoulder and thumb. -X-rays of the left shoulder revealed a left comminuted displaced proximal humerus fracture and left hand revealed irregularity of the thumb distal phalanx suspicious for nondisplaced fracture. -Patient seen in consultation by orthopedics, Dr. Everardo Pacific who recommended  transfer to [facility] for left total shoulder replacement today, 10/30/2023. -Continue left upper extremity in sling, continue left thumb splint. -Nonweightbearing left upper extremity. -Continue current pain management. -PT OT evaluation postoperatively. -Per orthopedics.  2.  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 -Patient noted to be on Lasix and ARB prior to admission. -UA pending. -Continue hydration with gentle IV fluids. -Monitor urine output. -Continue to hold Lasix and ARB. -Follow.  3.  Hypertension -BP currently stable. -Continue to hold diuretics and ARB due to concern for AKI on admission. -IV hydralazine as needed.  4.  Primary hyperparathyroidism/hypercalcemia -Patient with history of hyperparathyroidism currently not on any medications. -Calcium on admission noted at 10.7. -Calcium currently at 10.2 this morning with gentle hydration. -Follow-up.  5.-Hyperlipidemia  -Continue statin.  6.  Controlled type 2 diabetes mellitus, without long-term use of insulin -Hemoglobin A1c 7.0 -Patient currently NPO. -Check CBG every 4 hours. -Placed on SSI.  7.  Obesity -BMI 36.02 kg/m -Lifestyle modification -Outpatient follow-up with PCP.   DVT prophylaxis: Postop DVT prophylaxis per orthopedics. Code Status: Full Family Communication: Updated patient.  No family at bedside. Disposition: TBD  Status is: Inpatient Remains inpatient appropriate because: Severity of illness   Consultants:  Orthopedics: Dr. Everardo Pacific 10/29/2023  Procedures:  CT head CT C-spine 10/29/2023 CT left shoulder 10/29/2023 Plain films of the left hand 10/29/2023 Plan films of the left shoulder and left humerus 10/29/2023   Antimicrobials:  Anti-infectives (From admission, onward)    Start  Dose/Rate Route Frequency Ordered Stop   10/30/23 0900   ceFAZolin (ANCEF) IVPB 2g/100 mL premix        2 g 200 mL/hr over 30 Minutes Intravenous On call to O.R. 10/30/23 0803 10/31/23 0559         Subjective: Patient sitting up in recliner.  Complains of significant left shoulder pain, states pain medication helps a little bit but pain never completely goes away.  States pain with slight movement.  Denies any chest pain or shortness of breath.  No abdominal pain.  States usually wears compression stockings at home for lower extremity edema however does not have any on now and legs have not been elevated.  Awaiting to go to [facility] hospital for surgical intervention.  Objective: Vitals:   10/29/23 2014 10/30/23 0012 10/30/23 0517 10/30/23 0754  BP: (!) 142/61 (!) 127/43 (!) 135/43 (!) 113/49  Pulse: 75 76 73 72  Resp: 16 16 16 18   Temp: 98.3 F (36.8 C) 98.2 F (36.8 C) 98.3 F (36.8 C) 97.9 F (36.6 C)  TempSrc:      SpO2: 100% 96% 94% 98%  Weight:      Height:        Intake/Output Summary (Last 24 hours) at 10/30/2023 1023 Last data filed at 10/29/2023 1800 Gross per 24 hour  Intake 63.8 ml  Output --  Net 63.8 ml   Filed Weights   10/29/23 1317  Weight: 104.3 kg    Examination:  General exam: Appears calm and comfortable  Respiratory system: Clear to auscultation. Respiratory effort normal. Cardiovascular system: S1 & S2 heard, RRR. No JVD, murmurs, rubs, gallops or clicks.  1-2+ bilateral lower extremity edema Gastrointestinal system: Abdomen is nondistended, soft and nontender. No organomegaly or masses felt. Normal bowel sounds heard. Central nervous system: Alert and oriented. No focal neurological deficits. Extremities: Left upper extremity in sling.  1-2+ bilateral lower extremity edema. Skin: No rashes, lesions or ulcers Psychiatry: Judgement and insight appear normal. Mood & affect appropriate.     Data Reviewed: I have personally reviewed following labs and imaging studies  CBC: Recent Labs  Lab  10/29/23 1329 10/29/23 1336 10/30/23 0527  WBC 9.1  --  11.2*  NEUTROABS 5.5  --   --   HGB 14.5 13.9 13.4  HCT 42.5 41.0 40.1  MCV 89.9  --  91.6  PLT 297  --  262    Basic Metabolic Panel: Recent Labs  Lab 10/29/23 1329 10/29/23 1336 10/30/23 0527  NA 135 136 134*  K 4.0 4.1 4.2  CL 106 108 106  CO2 20*  --  24  GLUCOSE 150* 151* 146*  BUN 16 18 15   CREATININE 1.18* 1.20* 1.23*  CALCIUM 10.7*  --  10.2    GFR: Estimated Creatinine Clearance: 45.3 mL/min (A) (by C-G formula based on SCr of 1.23 mg/dL (H)).  Liver Function Tests: No results for input(s): "AST", "ALT", "ALKPHOS", "BILITOT", "PROT", "ALBUMIN" in the last 168 hours.  CBG: No results for input(s): "GLUCAP" in the last 168 hours.   No results found for this or any previous visit (from the past 240 hours).       Radiology Studies: CT SHOULDER LEFT WO CONTRAST Result Date: 10/29/2023 CLINICAL DATA:  Trauma to the left shoulder.  Fall.  Pain. EXAM: CT OF THE UPPER LEFT EXTREMITY WITHOUT CONTRAST TECHNIQUE: Multidetector CT imaging of the upper left extremity was performed according to the standard protocol. RADIATION DOSE REDUCTION: This exam was performed  according to the departmental dose-optimization program which includes automated exposure control, adjustment of the mA and/or kV according to patient size and/or use of iterative reconstruction technique. COMPARISON:  Left shoulder radiograph dated 10/29/2023. FINDINGS: Bones/Joint/Cartilage There is a comminuted fracture of the left humeral head and fracture of the humeral neck. There is rotation and anterior displacement of the humeral shaft in relation to the humeral head. There is impaction of the humeral shaft on the anterior aspect of the humeral head fracture. The humeral head remains in anatomic alignment with the glenoid. There is minimal irregularity and spurring of the bony glenoid. No joint effusion. Ligaments Suboptimally assessed by CT. Muscles  and Tendons No intramuscular fluid collection or hematoma. Soft tissues No acute findings. IMPRESSION: Comminuted, displaced, and impacted fracture of the left humeral head and neck. Electronically Signed   By: Elgie Collard M.D.   On: 10/29/2023 17:10   CT Head Wo Contrast Result Date: 10/29/2023 CLINICAL DATA:  Trauma, fall, landed on left shoulder. EXAM: CT HEAD WITHOUT CONTRAST CT CERVICAL SPINE WITHOUT CONTRAST TECHNIQUE: Multidetector CT imaging of the head and cervical spine was performed following the standard protocol without intravenous contrast. Multiplanar CT image reconstructions of the cervical spine were also generated. RADIATION DOSE REDUCTION: This exam was performed according to the departmental dose-optimization program which includes automated exposure control, adjustment of the mA and/or kV according to patient size and/or use of iterative reconstruction technique. COMPARISON:  None Available. FINDINGS: CT HEAD FINDINGS Brain: No acute intracranial hemorrhage. No CT evidence of acute infarct. No edema, mass effect, or midline shift. The basilar cisterns are patent. Ventricles: The ventricles are normal. Vascular: No hyperdense vessel or unexpected calcification. Skull: No acute or aggressive finding. Orbits: Orbits are symmetric. Sinuses: Mucosal thickening in the ethmoid sinuses and bilateral maxillary sinuses. Other: Mastoid air cells are clear. CT CERVICAL SPINE FINDINGS Alignment: Straightening of the normal cervical lordosis. Trace anterolisthesis of C4 on C5. No facet subluxation or dislocation. Skull base and vertebrae: No compression fracture or displaced fracture in the cervical spine. No suspicious osseous lesion. Soft tissues and spinal canal: No prevertebral fluid or swelling. No visible canal hematoma. Disc levels: Intervertebral disc spaces are maintained. Degenerative endplate osteophytes at multiple levels. No evidence of high-grade spinal canal stenosis. Facet arthrosis at  multiple levels throughout the cervical spine. Upper chest: Negative. Other: None. IMPRESSION: 1. No acute intracranial abnormality. 2. No acute fracture or traumatic malalignment of the cervical spine. 3. Multilevel degenerative changes in the cervical spine. Electronically Signed   By: Emily Filbert M.D.   On: 10/29/2023 15:48   CT Cervical Spine Wo Contrast Result Date: 10/29/2023 CLINICAL DATA:  Trauma, fall, landed on left shoulder. EXAM: CT HEAD WITHOUT CONTRAST CT CERVICAL SPINE WITHOUT CONTRAST TECHNIQUE: Multidetector CT imaging of the head and cervical spine was performed following the standard protocol without intravenous contrast. Multiplanar CT image reconstructions of the cervical spine were also generated. RADIATION DOSE REDUCTION: This exam was performed according to the departmental dose-optimization program which includes automated exposure control, adjustment of the mA and/or kV according to patient size and/or use of iterative reconstruction technique. COMPARISON:  None Available. FINDINGS: CT HEAD FINDINGS Brain: No acute intracranial hemorrhage. No CT evidence of acute infarct. No edema, mass effect, or midline shift. The basilar cisterns are patent. Ventricles: The ventricles are normal. Vascular: No hyperdense vessel or unexpected calcification. Skull: No acute or aggressive finding. Orbits: Orbits are symmetric. Sinuses: Mucosal thickening in the ethmoid sinuses and bilateral maxillary  sinuses. Other: Mastoid air cells are clear. CT CERVICAL SPINE FINDINGS Alignment: Straightening of the normal cervical lordosis. Trace anterolisthesis of C4 on C5. No facet subluxation or dislocation. Skull base and vertebrae: No compression fracture or displaced fracture in the cervical spine. No suspicious osseous lesion. Soft tissues and spinal canal: No prevertebral fluid or swelling. No visible canal hematoma. Disc levels: Intervertebral disc spaces are maintained. Degenerative endplate osteophytes at  multiple levels. No evidence of high-grade spinal canal stenosis. Facet arthrosis at multiple levels throughout the cervical spine. Upper chest: Negative. Other: None. IMPRESSION: 1. No acute intracranial abnormality. 2. No acute fracture or traumatic malalignment of the cervical spine. 3. Multilevel degenerative changes in the cervical spine. Electronically Signed   By: Emily Filbert M.D.   On: 10/29/2023 15:48   DG Hand Complete Left Result Date: 10/29/2023 CLINICAL DATA:  Pain after fall. EXAM: LEFT HAND - COMPLETE 3+ VIEW COMPARISON:  None Available. FINDINGS: Irregularity of the thumb distal phalanx is suspicious for nondisplaced fracture. This extends to the interphalangeal joint articular surface. No other fracture of the hand. Osteoarthritis involving the digits and thumb carpal metacarpal joint. The bones are diffusely under mineralized. No definite focal soft tissue abnormalities. IMPRESSION: 1. Irregularity of the thumb distal phalanx is suspicious for nondisplaced fracture. This extends to the interphalangeal joint articular surface. 2. Osteoarthritis. Electronically Signed   By: Narda Rutherford M.D.   On: 10/29/2023 15:34   DG Shoulder Left Result Date: 10/29/2023 CLINICAL DATA:  Lost balance leading to fall. Unable to move left shoulder. EXAM: LEFT SHOULDER - 2+ VIEW; LEFT HUMERUS - 2+ VIEW COMPARISON:  None Available. FINDINGS: Shoulder: Comminuted displaced proximal humerus fracture. The humeral head likely remains congruent with the glenoid, however there is a significant rotational component. Suspect apex lateral angulation. There is a displaced fragment involving the lateral humeral head. The humeral shaft appears to be displaced anteriorly. CT is planned for further assessment. The acromioclavicular joint is congruent. Humerus: The distal humerus is intact. Elbow alignment is poorly assessed on provided views. IMPRESSION: 1. Comminuted displaced proximal humerus fracture. The humeral head  likely remains congruent with the glenoid, however there is a significant rotational component in the humeral shaft appears to be displaced anteriorly. 2. No distal humerus fracture. Electronically Signed   By: Narda Rutherford M.D.   On: 10/29/2023 15:32   DG Humerus Left Result Date: 10/29/2023 CLINICAL DATA:  Lost balance leading to fall. Unable to move left shoulder. EXAM: LEFT SHOULDER - 2+ VIEW; LEFT HUMERUS - 2+ VIEW COMPARISON:  None Available. FINDINGS: Shoulder: Comminuted displaced proximal humerus fracture. The humeral head likely remains congruent with the glenoid, however there is a significant rotational component. Suspect apex lateral angulation. There is a displaced fragment involving the lateral humeral head. The humeral shaft appears to be displaced anteriorly. CT is planned for further assessment. The acromioclavicular joint is congruent. Humerus: The distal humerus is intact. Elbow alignment is poorly assessed on provided views. IMPRESSION: 1. Comminuted displaced proximal humerus fracture. The humeral head likely remains congruent with the glenoid, however there is a significant rotational component in the humeral shaft appears to be displaced anteriorly. 2. No distal humerus fracture. Electronically Signed   By: Narda Rutherford M.D.   On: 10/29/2023 15:32        Scheduled Meds:  chlorhexidine  60 mL Topical Once   Cholecalciferol  1 capsule Oral Daily   insulin aspart  0-15 Units Subcutaneous Q6H   loratadine  10 mg Oral  Daily   povidone-iodine  2 Application Topical Once   rosuvastatin  5 mg Oral Daily   sodium chloride flush  3 mL Intravenous Q12H   Continuous Infusions:  sodium chloride      ceFAZolin (ANCEF) IV       LOS: 1 day    Time spent: 35 minutes    Ramiro Harvest, MD [facility]   To contact the attending provider between 7A-7P or the covering provider during after hours 7P-7A, please log into the web site [email or web address] and access using  universal [place] password for that web site. If you do not have the password, please call the hospital operator.  10/30/2023, 10:23 AM

## 2023-10-30 NOTE — Progress Notes (Signed)
 Spoke with Maisie Fus at [place] to set up transport to have patient come to [facility] for surgery today.  Plan to have patient here at [facility] no later than 1200.  The plan is for the patient to the patient stay here after surgery.

## 2023-10-30 NOTE — Anesthesia Preprocedure Evaluation (Addendum)
 Anesthesia Evaluation  Patient identified by MRN, date of birth, ID band Patient awake    Reviewed: Allergy & Precautions, NPO status , Patient's Chart, lab work & pertinent test results  History of Anesthesia Complications Negative for: history of anesthetic complications  Airway Mallampati: II  TM Distance: >3 FB Neck ROM: Full    Dental no notable dental hx. (+) Teeth Intact, Dental Advisory Given   Pulmonary former smoker   Pulmonary exam normal breath sounds clear to auscultation       Cardiovascular hypertension, Pt. on medications (-) angina (-) Past MI Normal cardiovascular exam Rhythm:Regular Rate:Normal     Neuro/Psych    GI/Hepatic ,GERD  ,,  Endo/Other  diabetes, Type 2    Renal/GU Lab Results      Component                Value               Date                          K                        4.2                 10/30/2023                CO2                      24                  10/30/2023                BUN                      15                  10/30/2023                CREATININE               1.23 (H)            10/30/2023                GFRNONAA                 44 (L)              10/30/2023                    Musculoskeletal  (+) Arthritis , Osteoarthritis,    Abdominal   Peds  Hematology   Anesthesia Other Findings   Reproductive/Obstetrics                             Anesthesia Physical Anesthesia Plan  ASA: 3  Anesthesia Plan: General   Post-op Pain Management: Regional block* and Minimal or no pain anticipated   Induction:   PONV Risk Score and Plan: 4 or greater and Treatment may vary due to age or medical condition, Midazolam, Ondansetron and Dexamethasone  Airway Management Planned: Oral ETT  Additional Equipment: None  Intra-op Plan:   Post-operative Plan: Extubation in OR  Informed Consent: I have reviewed the patients History and  Physical, chart, labs and discussed the procedure including the risks, benefits  and alternatives for the proposed anesthesia with the patient or authorized representative who has indicated his/her understanding and acceptance.     Dental advisory given  Plan Discussed with: CRNA and Surgeon  Anesthesia Plan Comments: (GA w L ISB)       Anesthesia Quick Evaluation

## 2023-10-30 NOTE — Op Note (Signed)
 Orthopaedic Surgery Operative Note (CSN: [id number])  Jade Sanchez  May 01, 1941 Date of Surgery: 10/29/2023 - 10/30/2023   Diagnoses:  LEFT HUMERAL HEAD FRACTURE with four-part proximal humerus fracture  Procedure: Left reverse total Shoulder Arthroplasty Left open reduction total fixation of tuberosities   Operative Finding Successful completion of planned procedure.  Bone quality was reasonable considering the patient's age.  She uses a walker for ambulation we used a interlock screw to try and control rotation but we had good fit with her stem.  Stability was good.  Excellent of tug test was normal though it was difficult to test preoperative actually nerve function as the nerve block and already been placed.  Post-operative plan: The patient will be NWB in sling.  The patient will be will be admitted for mobilization, medical optimization and possibly the need for short term rehabilitation in an inpatient setting due to medical issues and fragility fracture.patient allowed to use arm for ADLs as well as walker, would recommend using a sling at night if possible.  DVT prophylaxis per primary team, no orthopedic contraindications.  Pain control with PRN pain medication preferring oral medicines.  Follow up plan will be scheduled in approximately 7 days for incision check and XR.  Physical therapy to start after 4 weeks in regards to her shoulder however okay for ambulation with therapy tomorrow.  Implants: Tornier perform humeral fracture size 13 stem, +3 retentive polyethylene, 32 interlock screw, 39 standard glenosphere with a 25+3 baseplate, 40 center screw and 2 peripheral screws  Post-Op Diagnosis: Same Surgeons:Primary: Bjorn Pippin, MD Assistants:Caroline McBane, PA-C Location: [facility] ROOM 08 Anesthesia: General with Exparel Interscalene Antibiotics: Ancef 2g preop, Vancomycin 1000mg  locally Tourniquet time: None Estimated Blood Loss: 100 Complications: None Specimens:  None Implants: Implant Name Type Inv. Item Serial No. Manufacturer Lot No. LRB No. Used Action  BASEPLATE GLENOID RSA 3X25 0D - [id number] Shoulder BASEPLATE GLENOID RSA 3X25 0D [id number] [employer]  Left 1 Implanted  GLENOSPHERE STANDARD 39 - [id number] Joint GLENOSPHERE STANDARD 39 [id number] [employer]  Left 1 Implanted  CUP HUM REV SHLD 3/4 39 +3 - [id number] Cup CUP HUM REV SHLD 3/4 39 +3 [id number] [employer]  Left 1 Implanted  STEM PERFORM FX 13X130 LT - [id number] Stem STEM PERFORM FX 13X130 LT [id number] [employer]  Left 1 Implanted  SCREW BONE 6.5X40 SM - [id number] Screw SCREW BONE 6.5X40 SM  [employer]  Left 1 Implanted  SCREW 5.5X26 - [id number] Screw SCREW 5.5X26  [employer]  Left 1 Implanted  SCREW 5.0X18 - [id number] Screw SCREW 5.0X18  [employer]  Left 1 Implanted  PERFORM HUMERAL FRACTURE SCREW      Left 1 Implanted    Indications for Surgery:   Jade Sanchez is a 83 y.o. female with proximal humerus fracture that was irreparable.  Benefits and risks of operative and nonoperative management were discussed prior to surgery with patient/guardian(s) and informed consent form was completed.  Infection and need for further surgery were discussed as was prosthetic stability and cuff issues.  We additionally specifically discussed risks of axillary nerve injury, infection, periprosthetic fracture, continued pain and longevity of implants prior to beginning procedure.      Procedure:   The patient was identified in the preoperative holding area where the surgical site was marked. Block placed by anesthesia with exparel.  The patient was taken to the OR where a procedural timeout was called and the above noted anesthesia was induced.  The patient was  positioned beachchair on allen table with spider arm positioner.  Preoperative antibiotics were dosed.  The patient's left shoulder was prepped and draped in the usual sterile fashion.  A second preoperative timeout  was called.       Standard deltopectoral approach was performed with a #10 blade. We dissected down to the subcutaneous tissues and the cephalic vein was taken laterally with the deltoid. Clavipectoral fascia was incised in line with the incision. Deep retractors were placed. The long of the biceps tendon was identified and there was significant tenosynovitis present.  Tenodesis was performed to the pectoralis tendon with #2 Ethibond. The remaining biceps was followed up into the rotator interval where it was released.    We used the bicipital groove as a landmark for the lesser and greater tuberosity fragments.  We were able to mobilize the lesser tuberosity fragment and placed stay sutures in the bone tendon junction to help with mobilization.  This point we were able to identify the  greater tuberosity fragment and 4 #5  FiberWire sutures were used to place into this for eventual repair of the tuberosities.  Once these were both mobilized we took care to identify the shaft fragment as well as the head fragment.  We carefully identified the head fragment were able to manually remove it.  At this point the axillary nerve was found and palpated and with a tug test noted to be intact.  Protected throughout the remainder of the case with blunt retractors.    We then released the SGHL with bovie cautery prior to placing a curved mayo at the junction of the anterior glenoid well above the axillary nerve and bluntly dissecting the subscapularis from the capsule.  We then carefully protected the axillary nerve as we gently released the inferior capsule to fully mobilize the subscapularis.  An anterior deltoid retractor was then placed as well as a small Hohmann retractor superiorly.   The glenoid was relatively preserved as we would expect in this fracture patient.  The remaining labrum was removed circumferentially taking great care not to disrupt the posterior capsule.    The glenoid drill guide was placed  and used to drill a guide pin in the center, inferior position. The glenoid face was then reamed concentrically over the guide wire. The center hole was drilled over the guidepin in a near anatomic angle of version. Next the glenoid vault was drilled back to a depth of 40 mm.  We tapped and then placed a 25mm size baseplate with +3 lateralization was selected with a 6.5 mm x 40mm length central screw.  The base plate was screwed into the glenoid vault obtaining secure fixation. We next placed superior and inferior locking screws for additional fixation.  Next a 39 mm glenosphere was selected and impacted onto the baseplate. The center screw was tightened.   We then repositioned the arm to give access to the humeral shaft fragment.  Drill holes were placed and fiberwire sutures in the shaft for vertical fixation of the tuberosities.  We broached with the Perform humeral fracture stem implants  up to size listed above which obtained an appropriate fit.    We trialed with multiple size tray and polyethylene options and selected a +3 retentive poly which provided good stability and range of motion without excess soft tissue tension.  The shoulder was trialed.  There was good ROM in all planes and the shoulder was stable with no inferior translation.   We then mobilized her tuberosities  again and placed the anterior deep limbs of the 4 #5 fiber wires around the stem.  1 of these was tied down fixing the greater tuberosity in place after bone graft harvest from the humeral head component was placed underneath.   The joint was reduced and thoroughly irrigated with pulsatile lavage. The remaining sutures were then placed through the subscapularis and the bone tendon junction and the tuberosities were reduced after bone graft placed beneath as autograft at the subscap.  We horizontally secured the tuberosities before placing vertical fixation with the suture that was placed into the shaft.  Tuberosities moved as a unit  were happy with the overall reduction.    We irrigated copiously at this point.  Hemostasis was obtained. The deltopectoral interval was reapproximated with #1 Ethibond. The subcutaneous tissues were closed with 3-0 Vicryl and the skin was closed with running suture.     Alfonse Alpers, PA-C, present and scrubbed throughout the case, critical for completion in a timely fashion, and for retraction, instrumentation, closure.

## 2023-10-30 NOTE — Plan of Care (Signed)

## 2023-10-30 NOTE — Anesthesia Postprocedure Evaluation (Signed)
 Anesthesia Post Note  Patient: Jade Sanchez  Procedure(s) Performed: ARTHROPLASTY, SHOULDER, TOTAL, REVERSE (Left: Shoulder) OPEN REDUCTION INTERNAL FIXATION (ORIF) SHOULDER FRACTURE TUBEROSCITY (Left: Shoulder)     Patient location during evaluation: PACU Anesthesia Type: General Level of consciousness: awake and alert Pain management: pain level controlled Vital Signs Assessment: post-procedure vital signs reviewed and stable Respiratory status: spontaneous breathing, nonlabored ventilation, respiratory function stable and patient connected to nasal cannula oxygen Cardiovascular status: blood pressure returned to baseline and stable Postop Assessment: no apparent nausea or vomiting Anesthetic complications: no  No notable events documented.  Last Vitals:  Vitals:   10/30/23 1645 10/30/23 1707  BP: (!) 131/90 (!) 130/101  Pulse: 73 75  Resp: 19 17  Temp:  36.6 C  SpO2: 100% 98%    Last Pain:  Vitals:   10/30/23 1707  TempSrc: Oral  PainSc:                  Trevor Iha

## 2023-10-30 NOTE — Transfer of Care (Signed)
 Immediate Anesthesia Transfer of Care Note  Patient: Jade Sanchez  Procedure(s) Performed: ARTHROPLASTY, SHOULDER, TOTAL, REVERSE (Left: Shoulder) OPEN REDUCTION INTERNAL FIXATION (ORIF) SHOULDER FRACTURE TUBEROSCITY (Left: Shoulder)  Patient Location: PACU  Anesthesia Type:General  Level of Consciousness: awake and alert   Airway & Oxygen Therapy: Patient Spontanous Breathing and Patient connected to nasal cannula oxygen  Post-op Assessment: Report given to RN and Post -op Vital signs reviewed and stable  Post vital signs: Reviewed and stable  Last Vitals:  Vitals Value Taken Time  BP 132/58 10/30/23 1550  Temp 36.5 C 10/30/23 1549  Pulse 81 10/30/23 1553  Resp 24 10/30/23 1553  SpO2 96 % 10/30/23 1553  Vitals shown include unfiled device data.  Last Pain:  Vitals:   10/30/23 1225  TempSrc:   PainSc: 6          Complications: No notable events documented.

## 2023-10-30 NOTE — Interval H&P Note (Signed)
 Unable to check Axler no function however, the patient is fractured prior surgery. Wrist benefits discussed. Patient elected to proceed.

## 2023-10-31 DIAGNOSIS — G8911 Acute pain due to trauma: Secondary | ICD-10-CM

## 2023-10-31 DIAGNOSIS — I10 Essential (primary) hypertension: Secondary | ICD-10-CM | POA: Diagnosis not present

## 2023-10-31 DIAGNOSIS — E210 Primary hyperparathyroidism: Secondary | ICD-10-CM

## 2023-10-31 DIAGNOSIS — M25512 Pain in left shoulder: Secondary | ICD-10-CM | POA: Diagnosis not present

## 2023-10-31 DIAGNOSIS — N179 Acute kidney failure, unspecified: Secondary | ICD-10-CM | POA: Diagnosis not present

## 2023-10-31 DIAGNOSIS — S42202P Unspecified fracture of upper end of left humerus, subsequent encounter for fracture with malunion: Secondary | ICD-10-CM | POA: Diagnosis not present

## 2023-10-31 LAB — CBC WITH DIFFERENTIAL/PLATELET
Abs Immature Granulocytes: 0.06 10*3/uL (ref 0.00–0.07)
Basophils Absolute: 0 10*3/uL (ref 0.0–0.1)
Basophils Relative: 0 %
Eosinophils Absolute: 0 10*3/uL (ref 0.0–0.5)
Eosinophils Relative: 0 %
HCT: 34.8 % — ABNORMAL LOW (ref 36.0–46.0)
Hemoglobin: 10.9 g/dL — ABNORMAL LOW (ref 12.0–15.0)
Immature Granulocytes: 1 %
Lymphocytes Relative: 12 %
Lymphs Abs: 1.3 10*3/uL (ref 0.7–4.0)
MCH: 30.4 pg (ref 26.0–34.0)
MCHC: 31.3 g/dL (ref 30.0–36.0)
MCV: 96.9 fL (ref 80.0–100.0)
Monocytes Absolute: 0.9 10*3/uL (ref 0.1–1.0)
Monocytes Relative: 8 %
Neutro Abs: 8.9 10*3/uL — ABNORMAL HIGH (ref 1.7–7.7)
Neutrophils Relative %: 79 %
Platelets: 215 10*3/uL (ref 150–400)
RBC: 3.59 MIL/uL — ABNORMAL LOW (ref 3.87–5.11)
RDW: 13.2 % (ref 11.5–15.5)
WBC: 11.2 10*3/uL — ABNORMAL HIGH (ref 4.0–10.5)
nRBC: 0 % (ref 0.0–0.2)

## 2023-10-31 LAB — GLUCOSE, CAPILLARY
Glucose-Capillary: 148 mg/dL — ABNORMAL HIGH (ref 70–99)
Glucose-Capillary: 179 mg/dL — ABNORMAL HIGH (ref 70–99)
Glucose-Capillary: 198 mg/dL — ABNORMAL HIGH (ref 70–99)

## 2023-10-31 LAB — RENAL FUNCTION PANEL
Albumin: 3.3 g/dL — ABNORMAL LOW (ref 3.5–5.0)
Anion gap: 6 (ref 5–15)
BUN: 13 mg/dL (ref 8–23)
CO2: 24 mmol/L (ref 22–32)
Calcium: 9.8 mg/dL (ref 8.9–10.3)
Chloride: 107 mmol/L (ref 98–111)
Creatinine, Ser: 0.84 mg/dL (ref 0.44–1.00)
GFR, Estimated: >60 mL/min (ref >60)
Glucose, Bld: 166 mg/dL — ABNORMAL HIGH (ref 70–99)
Phosphorus: 2.9 mg/dL (ref 2.5–4.6)
Potassium: 4.6 mmol/L (ref 3.5–5.1)
Sodium: 137 mmol/L (ref 135–145)

## 2023-10-31 MED ORDER — FUROSEMIDE 40 MG PO TABS
40.0000 mg | ORAL_TABLET | Freq: Every day | ORAL | Status: DC
  Administered 2023-11-01: 40 mg via ORAL
  Filled 2023-10-31: qty 1

## 2023-10-31 MED ORDER — OMEPRAZOLE 20 MG PO CPDR: 20.0000 mg | DELAYED_RELEASE_CAPSULE | Freq: Every day | ORAL | 0 refills | Status: AC

## 2023-10-31 MED ORDER — ASPIRIN 81 MG PO CHEW: 81.0000 mg | CHEWABLE_TABLET | Freq: Two times a day (BID) | ORAL | 0 refills | Status: AC

## 2023-10-31 MED ORDER — OXYCODONE HCL 5 MG PO TABS: ORAL_TABLET | ORAL | 0 refills | Status: AC

## 2023-10-31 MED ORDER — ACETAMINOPHEN 500 MG PO TABS: 1000.0000 mg | ORAL_TABLET | Freq: Three times a day (TID) | ORAL | 0 refills | Status: AC

## 2023-10-31 MED ORDER — ONDANSETRON HCL 4 MG PO TABS: 4.0000 mg | ORAL_TABLET | Freq: Three times a day (TID) | ORAL | 0 refills | Status: AC | PRN

## 2023-10-31 MED ORDER — POTASSIUM CHLORIDE CRYS ER 10 MEQ PO TBCR
10.0000 meq | EXTENDED_RELEASE_TABLET | Freq: Every day | ORAL | Status: DC
  Administered 2023-11-01: 10 meq via ORAL
  Filled 2023-10-31: qty 1

## 2023-10-31 NOTE — Progress Notes (Signed)
 [facility]  PROGRESS NOTE  Jade Sanchez WNU:[id number] DOB: 1940/11/15 DOA: 10/29/2023 PCP: Emilio Aspen, MD   Brief HPI:    83 year old female history of hypertension, hyperlipidemia, diabetes mellitus type 2, hyperparathyroidism, arthritis presented after having a fall.  Patient was attending a class at the senior center attempted to step over wheels of a large bulletin board of rollers foot got caught causing a fall.  Patient tried to catch herself by placing her hand on the board but rolled away did not hit her head during the fall but denied any significant pain in the head.  Patient with immediate pain and severe in the left shoulder since the incident and presented to the ED.  Patient seen in the ED plain films of the left shoulder revealed comminuted displaced proximal humerus fracture, x-rays of the left hand revealed irregularity of the thumb distal phalanx suspicious for nondisplaced fracture.  Orthopedics consulted and patient was  transferred to [facility] for total shoulder replacement on 10/30/2023.     Assessment/Plan:   left proximal humerus and left arm fracture secondary to mechanical fall status post left reverse total shoulder arthroplasty, left open reduction total fixation of tuberosities -Secondary to mechanical fall sustained at senior center after tripping over a rolling bulletin board injuring left shoulder and thumb. -X-rays of the left shoulder revealed a left comminuted displaced proximal humerus fracture and left hand revealed irregularity of the thumb distal phalanx suspicious for nondisplaced fracture. -Patient seen in consultation by orthopedics, Dr. Everardo Pacific who recommended transfer to [facility] for left total shoulder replacement, 10/30/2023. -Underwent surgery as above -, Orthopedics recommend Lovenox for DVT prophylaxis while inpatient and then discharged on aspirin 81 mg twice daily for 6 weeks -PT evaluation  obtained   2.  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  Hypertension -BP currently stable. -Restart Lasix, irbesartan    4.  Primary hyperparathyroidism/hypercalcemia -Patient with history of hyperparathyroidism currently not on any medications. -Calcium on admission noted at 10.2 -Repeat calcium is 9.8 today.    5.-Hyperlipidemia  -Continue statin.   6.  Controlled type 2 diabetes mellitus, without long-term use of insulin -Hemoglobin A1c 7.0 -Placed on SSI. -CBG well-controlled   7.  Obesity -BMI 36.02 kg/m -Lifestyle modification -Outpatient follow-up with PCP.      Medications     acetaminophen  500 mg Oral Q6H   Chlorhexidine Gluconate Cloth  6 each Topical Daily   cholecalciferol  2,000 Units Oral Daily   docusate sodium  100 mg Oral BID   enoxaparin (LOVENOX) injection  40 mg Subcutaneous Q24H   insulin aspart  0-15 Units Subcutaneous Q6H   irbesartan  300 mg Oral Daily   loratadine  10 mg Oral Daily   mupirocin ointment  1 Application Nasal BID   rosuvastatin  5 mg Oral Daily   sodium chloride flush  3 mL Intravenous Q12H     Data Reviewed:   CBG:  Recent Labs  Lab 10/30/23 1552 10/30/23 1730 10/30/23 2125 10/31/23 0003 10/31/23 0618  GLUCAP 123* 151* 210* 179* 148*    SpO2: 98 % O2 Flow Rate (L/min): 2 L/min    Vitals:   10/30/23 1707 10/30/23 2110 10/31/23 0116 10/31/23 0510  BP: (!) 130/101 (!) 145/71 (!) 134/45 124/73  Pulse: 75 (!) 104 77 68  Resp: 17 18 18 18   Temp: 97.8 F (36.6 C) 98.4 F (36.9 C) 98.2 F (36.8 C) 97.9 F (36.6 C)  TempSrc:  Oral Oral Oral Oral  SpO2: 98% 97% 96% 98%  Weight:      Height:          Data Reviewed:  Basic Metabolic Panel: Recent Labs  Lab 10/29/23 1329 10/29/23 1336 10/30/23 0527 10/30/23 1718 10/31/23 0633  NA 135 136 134*  --  137  K 4.0 4.1 4.2  --  4.6  CL 106 108 106  --  107  CO2 20*  --  24  --  24  GLUCOSE 150* 151* 146*  --  166*  BUN 16 18 15   --  13   CREATININE 1.18* 1.20* 1.23* 0.92 0.84  CALCIUM 10.7*  --  10.2  --  9.8  PHOS  --   --   --   --  2.9    CBC: Recent Labs  Lab 10/29/23 1329 10/29/23 1336 10/30/23 0527 10/30/23 1718 10/31/23 0633  WBC 9.1  --  11.2* 13.0* 11.2*  NEUTROABS 5.5  --   --   --  8.9*  HGB 14.5 13.9 13.4 12.0 10.9*  HCT 42.5 41.0 40.1 38.3 34.8*  MCV 89.9  --  91.6 96.0 96.9  PLT 297  --  262 232 215    LFT Recent Labs  Lab 10/31/23 0633  ALBUMIN 3.3*     Antibiotics: Anti-infectives (From admission, onward)    Start     Dose/Rate Route Frequency Ordered Stop   10/30/23 2000  ceFAZolin (ANCEF) IVPB 2g/100 mL premix        2 g 200 mL/hr over 30 Minutes Intravenous Every 6 hours 10/30/23 1625 10/31/23 1359   10/30/23 1442  vancomycin (VANCOCIN) powder  Status:  Discontinued          As needed 10/30/23 1442 10/30/23 1624   10/30/23 0900  ceFAZolin (ANCEF) IVPB 2g/100 mL premix        2 g 200 mL/hr over 30 Minutes Intravenous On call to O.R. 10/30/23 0803 10/30/23 1403        DVT prophylaxis: Lovenox  Code Status: Full code  Family Communication: No family at bedside   CONSULTS orthopedics   Subjective   Denies any complaints.  Pain well-controlled.   Objective    Physical Examination:   General-appears in no acute distress Heart-S1-S2, regular, no murmur auscultated Lungs-clear to auscultation bilaterally, no wheezing or crackles auscultated Abdomen-soft, nontender, no organomegaly Extremities-left shoulder in sling Neuro-alert, oriented x3, no focal deficit noted   Status is: Inpatient:             Meredeth Ide   [facility] If 7PM-7AM, please contact night-coverage at [email or web address], Office  [phone]   10/31/2023, 8:59 AM  LOS: 2 days

## 2023-10-31 NOTE — TOC Initial Note (Signed)
 Transition of Care [facility]) - Initial/Assessment Note    Patient Details  Name: Jade Sanchez MRN: [id number] Date of Birth: 06/14/1941  Transition of Care [facility] CM/SW Contact:    Otelia Santee, LCSW Phone Number: 10/31/2023, 1:28 PM  Clinical Narrative:                 Pt from home alone. Pt has rollator at home and denies current home services. Pt is agreeable to recommendation for HHPT and declines OT. PT states she would like [facility] services with the same HHA she had in the past. From chart review pt noted to have been set up with [facility] for [facility] on past admission. Pt acknowledged this is the correct agency. HHPT has been arranged with [facility]. HH orders will need to be placed prior to discharge.  Pt unsure of when she received her rollator however, is agreeable to have a hemi-walker ordered if insurance will cover it. Have contacted [employer] to determine if pt's insurance will cover a hemi walker.   Expected Discharge Plan: Home w Home Health Services Barriers to Discharge: No Barriers Identified   Patient Goals and CMS Choice Patient states their goals for this hospitalization and ongoing recovery are:: To return home CMS Medicare.gov Compare Post Acute Care list provided to:: Patient Choice offered to / list presented to : Patient [place] ownership interest in [facility].provided to::  (NA)    Expected Discharge Plan and Services In-house Referral: Clinical Social Work Discharge Planning Services: NA Post Acute Care Choice: Home Health, Durable Medical Equipment Living arrangements for the past 2 months: Single Family Home                           HH Arranged: PT HH Agency: [facility] Date HH Agency Contacted: 10/31/23 Time HH Agency Contacted: 1328 Representative spoke with at [facility] Agency: Elnita Maxwell  Prior Living Arrangements/Services Living arrangements for the past 2 months: Single Family Home Lives with:: Self Patient  language and need for interpreter reviewed:: Yes Do you feel safe going back to the place where you live?: Yes      Need for Family Participation in Patient Care: No (Comment) Care giver support system in place?: No (comment) Current home services: DME (rollator) Criminal Activity/Legal Involvement Pertinent to Current Situation/Hospitalization: No - Comment as needed  Activities of Daily Living   ADL Screening (condition at time of admission) Independently performs ADLs?: Yes (appropriate for developmental age) Is the patient deaf or have difficulty hearing?: No Does the patient have difficulty seeing, even when wearing glasses/contacts?: No Does the patient have difficulty concentrating, remembering, or making decisions?: No  Permission Sought/Granted Permission sought to share information with : Facility Industrial/product designer granted to share information with : Yes, Verbal Permission Granted     Permission granted to share info w AGENCY: HHA        Emotional Assessment Appearance:: Appears stated age Attitude/Demeanor/Rapport: Engaged Affect (typically observed): Accepting Orientation: : Oriented to Self, Oriented to Place, Oriented to  Time, Oriented to Situation Alcohol / Substance Use: Not Applicable Psych Involvement: No (comment)  Admission diagnosis:  Humerus fracture [S42.309A] Proximal humerus fracture [S42.209A] Acute pain of left shoulder due to trauma [M25.512, G89.11] Patient Active Problem List   Diagnosis Date Noted   Proximal humerus fracture 10/29/2023   Fall 10/29/2023   Thumb fracture 10/29/2023   Hypercalcemia 10/29/2023   AKI (acute kidney injury) (HCC) 10/29/2023  Obesity (BMI 30-39.9) 10/29/2023   Genetic testing 09/25/2021   Family history of melanoma 09/18/2021   Endometrial polyp 09/18/2021   Counseling and coordination of care 09/18/2021   Family history of colon cancer 08/16/2021   Ovarian cyst, bilateral 07/31/2021   Gout  attack 07/26/2021   Thickened endometrium    Chronic kidney disease, stage 3a (HCC) 07/17/2021   Essential hypertension 07/17/2021   Hypercholesterolemia 07/17/2021   Primary hyperparathyroidism (HCC) 07/17/2021   Type 2 diabetes mellitus (HCC) 07/17/2021   Sepsis (HCC) 07/17/2021   Adnexal mass 07/17/2021   Elevated bilirubin 07/17/2021   RUQ pain 07/17/2021   Hyponatremia 07/17/2021   PCP:  Emilio Aspen, MD Pharmacy:   [facility] [phone] - [place], [place] - [address] [place] [address] Phone: [phone] Fax: [phone]  [facility] [address] [place] [address] Phone: [phone] Fax: [phone]     Social Drivers of Health (SDOH) Social History: SDOH Screenings   Food Insecurity: No Food Insecurity (10/29/2023)  Housing: Low Risk  (10/29/2023)  Transportation Needs: No Transportation Needs (10/29/2023)  Utilities: Not At Risk (10/29/2023)  Depression (PHQ2-9): Low Risk  (06/28/2022)  Social Connections: Moderately Integrated (10/29/2023)  Tobacco Use: Medium Risk (10/30/2023)   SDOH Interventions:     Readmission Risk Interventions    07/26/2021   12:44 PM  Readmission Risk Prevention Plan  Post Dischage Appt Complete  Medication Screening Complete  Transportation Screening Complete

## 2023-10-31 NOTE — Evaluation (Signed)
 Physical Therapy Evaluation Patient Details Name: Jade Sanchez [facility] MRN: [id number] DOB: [birth year] Today's Date: 10/31/2023  History of Present Illness  Pt is a 83 y.o. female admitted 10/29/23 for dx L humeral fx with operative TSA 10/30/23 with medical history significant of hypertension, hyperlipidemia, diabetes mellitus type 2, hyperparathyroidism, and arthritis who presents after having a fall.  Clinical Impression  Pt admitted with above diagnosis. Pt demonstrates good problem solving and ability to complete tasks simulating home environment. Pt is able to enter and exit bed with use of bedrail, recommended to family and pt./ We used hemiwalker this session which pt did well with amb x125 feet on RA (O2 remains WNL). She is able to complete 1 step to simulate home entry/exit. IV pole follow. Pt currently still feeling effects of nn block from surgery and is concerned about function in the presence of inc pain. Pt is PWB on LUE at waist level so she may be able to return to use of 4WW. Will follow up to assess this. Pt currently with functional limitations due to the deficits listed below (see PT Problem List). Pt will benefit from acute skilled PT to increase their independence and safety with mobility to allow discharge.           If plan is discharge home, recommend the following: A little help with walking and/or transfers;A little help with bathing/dressing/bathroom;Assistance with cooking/housework;Help with stairs or ramp for entrance;Assist for transportation   Can travel by private vehicle        Equipment Recommendations Other (comment) Psychologist, educational)  Recommendations for Other Services  OT consult    Functional Status Assessment Patient has had a recent decline in their functional status and demonstrates the ability to make significant improvements in function in a reasonable and predictable amount of time.     Precautions / Restrictions Precautions Precautions: Fall  (PWB LUE) Recall of Precautions/Restrictions: Intact Required Braces or Orthoses: Sling Restrictions Weight Bearing Restrictions Per Provider Order: Yes Other Position/Activity Restrictions: PWB at waist level      Mobility  Bed Mobility Overal bed mobility: Needs Assistance Bed Mobility: Supine to Sit, Sit to Supine     Supine to sit: Modified independent (Device/Increase time) Sit to supine: Modified independent (Device/Increase time)   General bed mobility comments: uses handrail    Transfers Overall transfer level: Needs assistance Equipment used: Hemi-walker Transfers: Sit to/from Stand Sit to Stand: Contact guard assist           General transfer comment: HHA for STS    Ambulation/Gait Ambulation/Gait assistance: Supervision Gait Distance (Feet): 125 Feet Assistive device: Hemi-walker Gait Pattern/deviations: Step-through pattern, Wide base of support, Trunk flexed, Decreased stride length Gait velocity: dec     General Gait Details: gains confidence and cadence with progression of mobility today  Stairs Stairs: Yes Stairs assistance: Supervision Stair Management: Sideways Number of Stairs: 1 General stair comments: has 1 step onto deck and 1 step into house with column for support-simulated this session  Wheelchair Mobility     Tilt Bed    Modified Rankin (Stroke Patients Only)       Balance Overall balance assessment: Needs assistance Sitting-balance support: No upper extremity supported, Feet supported Sitting balance-Leahy Scale: Good     Standing balance support: No upper extremity supported, During functional activity Standing balance-Leahy Scale: Good  Pertinent Vitals/Pain Pain Assessment Pain Assessment: No/denies pain    Home Living Family/patient expects to be discharged to:: Private residence Living Arrangements: Alone Available Help at Discharge: Family;Available  PRN/intermittently Type of Home: House Home Access: Stairs to enter Entrance Stairs-Rails: None Entrance Stairs-Number of Steps: 1 (x2)   Home Layout: One level Home Equipment: Rollator (4 wheels);Grab bars - toilet      Prior Function Prior Level of Function : Independent/Modified Independent;Driving;History of Falls (last six months)             Mobility Comments: rollator for community activities ADLs Comments: takes several classes at senior center, enjoys crafts     Extremity/Trunk Assessment        Lower Extremity Assessment Lower Extremity Assessment: Generalized weakness       Communication   Communication Communication: No apparent difficulties    Cognition Arousal: Alert Behavior During Therapy: WFL for tasks assessed/performed   PT - Cognitive impairments: No apparent impairments                         Following commands: Intact       Cueing Cueing Techniques: Verbal cues     General Comments General comments (skin integrity, edema, etc.): PWB through LUE, currently still paraesthetic from nn block, concerned about pain in coming days as sensation returns    Exercises     Assessment/Plan    PT Assessment Patient needs continued PT services  PT Problem List Decreased strength;Decreased activity tolerance;Decreased balance;Decreased range of motion;Decreased mobility       PT Treatment Interventions DME instruction;Functional mobility training;Balance training;Patient/family education;Gait training;Therapeutic activities;Stair training;Therapeutic exercise    PT Goals (Current goals can be found in the Care Plan section)  Acute Rehab PT Goals Patient Stated Goal: return home and return to crafts classes PT Goal Formulation: With patient Time For Goal Achievement: 11/14/23 Potential to Achieve Goals: Good    Frequency Min 2X/week     Co-evaluation               AM-PAC PT "6 Clicks" Mobility  Outcome Measure Help  needed turning from your back to your side while in a flat bed without using bedrails?: A Little Help needed moving from lying on your back to sitting on the side of a flat bed without using bedrails?: A Little Help needed moving to and from a bed to a chair (including a wheelchair)?: A Little Help needed standing up from a chair using your arms (e.g., wheelchair or bedside chair)?: A Little Help needed to walk in hospital room?: A Little Help needed climbing 3-5 steps with a railing? : A Little 6 Click Score: 18    End of Session Equipment Utilized During Treatment: Gait belt;Other (comment) (sling) Activity Tolerance: Patient tolerated treatment well Patient left: in chair;with family/visitor present;with call bell/phone within reach Nurse Communication: Mobility status;Precautions PT Visit Diagnosis: Other abnormalities of gait and mobility (R26.89);Muscle weakness (generalized) (M62.81);History of falling (Z91.81)    Time: 0900-1003 PT Time Calculation (min) (ACUTE ONLY): 63 min   Charges:   PT Evaluation $PT Eval Low Complexity: 1 Low PT Treatments $Gait Training: 38-52 mins $Therapeutic Activity: 8-22 mins PT General Charges $$ ACUTE PT VISIT: 1 Visit         Madaline Guthrie, PT Acute Rehabilitation Services Office: [phone] 10/31/2023   Jade Sanchez 10/31/2023, 10:40 AM

## 2023-10-31 NOTE — Progress Notes (Signed)
   ORTHOPAEDIC PROGRESS NOTE  s/p Procedure(s): ARTHROPLASTY, SHOULDER, TOTAL, REVERSE OPEN REDUCTION INTERNAL FIXATION (ORIF) SHOULDER FRACTURE TUBEROSCITY  SUBJECTIVE: Reports mild pain about operative site. Nerve block still working. She would like to be able to discharge home. She only uses her walker when she walks long distances.   No chest pain. No SOB. No nausea/vomiting. No other complaints.  OBJECTIVE: PE: General: sitting up in hospital bed, NAD LUE: Dressing CDI and sling well fitting.  Axillary nerve sensation/motor altered in setting of block and unable to be fully tested.  Distal motor and sensory altered in setting of block.   Vitals:   10/31/23 0116 10/31/23 0510  BP: (!) 134/45 124/73  Pulse: 77 68  Resp: 18 18  Temp: 98.2 F (36.8 C) 97.9 F (36.6 C)  SpO2: 96% 98%   Stable post-op images  ASSESSMENT: Jade Sanchez is a 83 y.o. female POD#1  PLAN: Weightbearing:   - NWB in sling  - okay to use left arm when weightbearing with a walker  - okay to use left arm for ADLs at waist level  - No shoulder ROM  - okay for elbow, wrist, hand exercises  Insicional and dressing care: Reinforce dressings as needed Orthopedic device(s):  Sling Showering: Post-op day #2 with assistance VTE prophylaxis: Lovenox while inpatient. Will transition to aspirin at discharge Pain control: PRN pain medications, minimize narcotics as able Follow - up plan: 1 week in office Dispo: TBD. PT/OT evals today.  Contact information:  Weekdays 8-5 Dr. Ramond Marrow, Alfonse Alpers PA-C, After hours and holidays please check [email or web address] for group call information for [facility]  Alfonse Alpers, PA-C 10/31/2023

## 2023-10-31 NOTE — Progress Notes (Signed)
 Physical Therapy Treatment Patient Details Name: Jade Sanchez MRN: [id number] DOB: [birth year] Today's Date: 10/31/2023   History of Present Illness Pt is a 83 y.o. female admitted 10/29/23 for dx L humeral fx with operative TSA 10/30/23 with medical history significant of hypertension, hyperlipidemia, diabetes mellitus type 2, hyperparathyroidism, and arthritis who presents after having a fall.    PT Comments  Pt trials 4WW as this is her home device. Pt able to complete STS without HHA, needs time once standing for stabilization. Pt requires help attaining LUE position on walker but is able to progress mobility a greater distance this session, amb x466ft with 4WW at sup A. Pt has some SOB, but denies other complaints. Pt demos STS without asisstance using RUE, but carry over will depend on height of chair. Pt discussed use and settings of 4WW vs hemiwalker. Pt denies further questions or concerns at this time.     If plan is discharge home, recommend the following: A little help with bathing/dressing/bathroom;Assistance with cooking/housework;Help with stairs or ramp for entrance;Assist for transportation   Can travel by private vehicle        Equipment Recommendations  Other (comment) Psychologist, educational)    Recommendations for Other Services       Precautions / Restrictions Precautions Precautions: Fall Recall of Precautions/Restrictions: Intact Precaution/Restrictions Comments: PWB LUE Required Braces or Orthoses: Sling Restrictions Weight Bearing Restrictions Per Provider Order: Yes LUE Weight Bearing Per Provider Order: Partial weight bearing Other Position/Activity Restrictions: PWB at waist level     Mobility  Bed Mobility Overal bed mobility: Needs Assistance Bed Mobility: Supine to Sit, Sit to Supine     Supine to sit: Modified independent (Device/Increase time) Sit to supine: Modified independent (Device/Increase time)   General bed mobility comments: uses  handrail    Transfers Overall transfer level: Needs assistance Equipment used: Rollator (4 wheels) Transfers: Sit to/from Stand Sit to Stand: Modified independent (Device/Increase time)           General transfer comment: steady assist without AD    Ambulation/Gait Ambulation/Gait assistance: Supervision Gait Distance (Feet): 400 Feet Assistive device: Rollator (4 wheels) Gait Pattern/deviations: Step-through pattern, Wide base of support, Trunk flexed, Decreased stride length Gait velocity: dec     General Gait Details: gains confidence and cadence with progression of mobility today, LUE has trouble grasping handle, able to maintain position once on handle   Stairs Stairs: Yes Stairs assistance: Supervision Stair Management: Sideways Number of Stairs: 1 General stair comments: has 1 step onto deck and 1 step into house with column for support-simulated this session   Wheelchair Mobility     Tilt Bed    Modified Rankin (Stroke Patients Only)       Balance Overall balance assessment: Needs assistance Sitting-balance support: No upper extremity supported, Feet supported Sitting balance-Leahy Scale: Good     Standing balance support: No upper extremity supported, During functional activity Standing balance-Leahy Scale: Good                              Communication    Cognition Arousal: Alert Behavior During Therapy: WFL for tasks assessed/performed   PT - Cognitive impairments: No apparent impairments                         Following commands: Intact      Cueing Cueing Techniques: Verbal cues  Exercises      General  Comments        Pertinent Vitals/Pain Pain Assessment Pain Assessment: No/denies pain    Home Living                          Prior Function            PT Goals (current goals can now be found in the care plan section) Acute Rehab PT Goals Patient Stated Goal: return home and return to  crafts classes PT Goal Formulation: With patient Time For Goal Achievement: 11/14/23 Potential to Achieve Goals: Good Progress towards PT goals: Progressing toward goals    Frequency    Min 2X/week      PT Plan      Co-evaluation              AM-PAC PT "6 Clicks" Mobility   Outcome Measure  Help needed turning from your back to your side while in a flat bed without using bedrails?: A Little Help needed moving from lying on your back to sitting on the side of a flat bed without using bedrails?: A Little Help needed moving to and from a bed to a chair (including a wheelchair)?: None Help needed standing up from a chair using your arms (e.g., wheelchair or bedside chair)?: None Help needed to walk in hospital room?: A Little Help needed climbing 3-5 steps with a railing? : A Little 6 Click Score: 20    End of Session Equipment Utilized During Treatment: Gait belt Activity Tolerance: Patient tolerated treatment well Patient left: in chair;with call bell/phone within reach Nurse Communication: Mobility status PT Visit Diagnosis: Other abnormalities of gait and mobility (R26.89);Muscle weakness (generalized) (M62.81);History of falling (Z91.81)     Time: 4098-1191 PT Time Calculation (min) (ACUTE ONLY): 32 min  Charges:    $Gait Training: 23-37 mins PT General Charges $$ ACUTE PT VISIT: 1 Visit                     Madaline Guthrie, PT Acute Rehabilitation Services Office: [phone] 10/31/2023    Jade Sanchez 10/31/2023, 4:00 PM

## 2023-10-31 NOTE — Discharge Instructions (Signed)
 Ramond Marrow MD, MPH Alfonse Alpers, PA-C [facility] [address] [phone] (tel)   [phone] (fax)   POST-OPERATIVE INSTRUCTIONS - TOTAL SHOULDER REPLACEMENT    WOUND CARE You may leave the operative dressing in place until your follow-up appointment. KEEP THE INCISIONS CLEAN AND DRY. There may be a small amount of fluid/bleeding leaking at the surgical site. This is normal after surgery.  If it fills with liquid or blood please call us immediately to change it for you. Use the provided ice machine or Ice packs as often as possible for the first 3-4 days, then as needed for pain relief.   Keep a layer of cloth or a shirt between your skin and the cooling unit to prevent frost bite as it can get very cold.  SHOWERING: - You may shower on Post-Op Day #2.  - The dressing is water resistant but do not scrub it as it may start to peel up.   - You may remove the sling for showering - Gently pat the area dry.  - Do not soak the shoulder in water.  - Do not go swimming in the pool or ocean until your incision has completely healed (about 4-6 weeks after surgery) - KEEP THE INCISIONS CLEAN AND DRY.  EXERCISES Wear the sling at all times  You may remove the sling for showering, but keep the arm across the chest or in a secondary sling.    Accidental/Purposeful External Rotation and shoulder flexion (reaching behind you) is to be avoided at all costs for the first month. It is ok to come out of your sling if your are sitting and have assistance for eating.   Do not lift anything heavier than 1 pound until we discuss it further in clinic.  It is normal for your fingers/hand to become more swollen after surgery and discolored from bruising.   This will resolve over the first few weeks usually after surgery. Please continue to ambulate and do not stay sitting or lying for too long.  Perform foot and wrist pumps to assist in circulation.  PHYSICAL  THERAPY - No shoulder ROM / no shoulder therapy for 4 weeks after surgery   REGIONAL ANESTHESIA (NERVE BLOCKS) The anesthesia team may have performed a nerve block for you this is a great tool used to minimize pain.   The block may start wearing off overnight (between 8-24 hours postop) When the block wears off, your pain may go from nearly zero to the pain you would have had postop without the block. This is an abrupt transition but nothing dangerous is happening.   This can be a challenging period but utilize your as needed pain medications to try and manage this period. We suggest you use the pain medication the first night prior to going to bed, to ease this transition.  You may take an extra dose of narcotic when this happens if needed   POST-OP MEDICATIONS- Multimodal approach to pain control In general your pain will be controlled with a combination of substances.  Prescriptions unless otherwise discussed are electronically sent to your pharmacy.  This is a carefully made plan we use to minimize narcotic use.      Acetaminophen - Non-narcotic pain medicine taken on a scheduled basis  Oxycodone - This is a strong narcotic, to be used only on an "as needed" basis for SEVERE pain. Aspirin 81mg  - This medicine is used to minimize the risk of blood clots after surgery.  Omeprazole - daily medicine to protect your stomach while taking anti-inflammatories.   Zofran -  take as needed for nausea   FOLLOW-UP If you develop a Fever (>101.5), Redness or Drainage from the surgical incision site, please call our office to arrange for an evaluation. Please call the office to schedule a follow-up appointment for a wound check, 7-10 days post-operatively.  IF YOU HAVE ANY QUESTIONS, PLEASE FEEL FREE TO CALL OUR OFFICE.  HELPFUL INFORMATION  Your arm will be in a sling following surgery. You will be in this sling for the next 4 weeks.   You may be more comfortable sleeping in a semi-seated  position the first few nights following surgery.  Keep a pillow propped under the elbow and forearm for comfort.  If you have a recliner type of chair it might be beneficial.  If not that is fine too, but it would be helpful to sleep propped up with pillows behind your operated shoulder as well under your elbow and forearm.  This will reduce pulling on the suture lines.  When dressing, put your operative arm in the sleeve first.  When getting undressed, take your operative arm out last.  Loose fitting, button-down shirts are recommended.  In most states it is against the law to drive while your arm is in a sling. And certainly against the law to drive while taking narcotics.  You may return to work/school in the next couple of days when you feel up to it. Desk work and typing in the sling is fine.  We suggest you use the pain medication the first night prior to going to bed, in order to ease any pain when the anesthesia wears off. You should avoid taking pain medications on an empty stomach as it will make you nauseous.  You should wean off your narcotic medicines as soon as you are able.     Most patients will be off narcotics before their first postop appointment.   Do not drink alcoholic beverages or take illicit drugs when taking pain medications.  Pain medication may make you constipated.  Below are a few solutions to try in this order: Decrease the amount of pain medication if you aren't having pain. Drink lots of decaffeinated fluids. Drink prune juice and/or each dried prunes  If the first 3 don't work start with additional solutions Take Colace - an over-the-counter stool softener Take Senokot - an over-the-counter laxative Take Miralax - a stronger over-the-counter laxative   Dental Antibiotics:  In most cases prophylactic antibiotics for Dental procdeures after total joint surgery are not necessary.  Exceptions are as follows:  1. History of prior total joint  infection  2. Severely immunocompromised (Organ Transplant, cancer chemotherapy, Rheumatoid biologic meds such as Humira)  3. Poorly controlled diabetes (A1C &gt; 8.0, blood glucose over 200)  If you have one of these conditions, contact your surgeon for an antibiotic prescription, prior to your dental procedure.   For more information including helpful videos and documents visit our website:   [email or web address]

## 2023-11-01 DIAGNOSIS — S62502A Fracture of unspecified phalanx of left thumb, initial encounter for closed fracture: Secondary | ICD-10-CM | POA: Diagnosis not present

## 2023-11-01 DIAGNOSIS — S42202P Unspecified fracture of upper end of left humerus, subsequent encounter for fracture with malunion: Secondary | ICD-10-CM | POA: Diagnosis not present

## 2023-11-01 DIAGNOSIS — E210 Primary hyperparathyroidism: Secondary | ICD-10-CM | POA: Diagnosis not present

## 2023-11-01 DIAGNOSIS — I10 Essential (primary) hypertension: Secondary | ICD-10-CM | POA: Diagnosis not present

## 2023-11-01 MED ORDER — DOCUSATE SODIUM 100 MG PO CAPS: 100.0000 mg | ORAL_CAPSULE | Freq: Two times a day (BID) | ORAL | 0 refills | Status: AC | PRN

## 2023-11-01 NOTE — Progress Notes (Signed)
   ORTHOPAEDIC PROGRESS NOTE  s/p Procedure(s): ARTHROPLASTY, SHOULDER, TOTAL, REVERSE OPEN REDUCTION INTERNAL FIXATION (ORIF) SHOULDER FRACTURE TUBEROSCITY  SUBJECTIVE: Reports more pain in her shoulder since the nerve block wore off. She did well with therapy yesterdayShe would like to be able to discharge home. She only uses her walker when she walks long distances.   No chest pain. No SOB. No nausea/vomiting. No other complaints.  OBJECTIVE: PE: General: sitting up in hospital bed, NAD LUE: Dressing CDI and sling well fitting.  She endorses axillary nerve sensation. Difficult to appreciate deltoid motor function. Sensation intact in medial, radial, and ulnar distributions. Tender to palpation IP joint of left thumb. Bruising noted here as well. Well perfused digits.     Vitals:   10/31/23 2033 11/01/23 0414  BP: (!) 142/64 (!) 149/48  Pulse: 78 79  Resp: 18 16  Temp: 98.1 F (36.7 C) 98.6 F (37 C)  SpO2: 100% 91%   Stable post-op images  Xrays of left hand demonstrate a nondisplaced distal phalanx fracture of the left thumb  ASSESSMENT: Jade Sanchez is a 83 y.o. female POD#2  PLAN: Weightbearing:   - NWB in sling  - okay to use left arm when weightbearing with a walker  - okay to use left arm for ADLs at waist level  - No shoulder ROM  - okay for elbow, wrist, hand exercises  Insicional and dressing care: Reinforce dressings as needed Orthopedic device(s):  Sling - will order thumb spica splint for left hand Showering: Post-op day #2 with assistance VTE prophylaxis: Lovenox while inpatient. Will transition to aspirin at discharge Pain control: PRN pain medications, minimize narcotics as able Follow - up plan: 1 week in office Dispo: Okay to discharge from orthopedic standpoint once cleared by medicine and therapies. Medications sent to patient's outpatient pharmacy.   Contact information:  Weekdays 8-5 Dr. Ramond Marrow, Alfonse Alpers PA-C, After  hours and holidays please check [email or web address] for group call information for [facility]  Alfonse Alpers, PA-C 11/01/2023

## 2023-11-01 NOTE — Progress Notes (Signed)
 Physical Therapy Treatment Patient Details Name: Jade Sanchez [facility] MRN: [id number] DOB: [birth year] Today's Date: 11/01/2023   History of Present Illness Pt is a 83 y.o. female admitted 10/29/23 for dx L humeral fx with operative TSA 10/30/23 with medical history significant of hypertension, hyperlipidemia, diabetes mellitus type 2, hyperparathyroidism, and arthritis who presents after having a fall.    PT Comments  Pt agreeable to PT session, in bed this morning on PT arrival. Pt attempts supine to sit EOB with HOB flat and has increased difficulty due to pain. Pt LUE positioned more anterior to place in guarding position, which improves pain and allows pt to roll and use handrail to come to sit. Discussed use of recliner or increasing number of pillows under trunk to improve efficiency. Pt is able to amb x400 ft with hemiwalker today and was well tolerated. Ice applied in chair with callbell and tray table. Pt denies further questions, comments, or concerns.     If plan is discharge home, recommend the following: A little help with bathing/dressing/bathroom;Assistance with cooking/housework;Help with stairs or ramp for entrance;Assist for transportation   Can travel by private vehicle        Equipment Recommendations  Other (comment) Psychologist, educational)    Recommendations for Other Services       Precautions / Restrictions Precautions Precautions: Fall Recall of Precautions/Restrictions: Intact Precaution/Restrictions Comments: PWB LUE Required Braces or Orthoses: Sling Restrictions Weight Bearing Restrictions Per Provider Order: Yes LUE Weight Bearing Per Provider Order: Partial weight bearing Other Position/Activity Restrictions: PWB at waist level     Mobility  Bed Mobility Overal bed mobility: Needs Assistance Bed Mobility: Supine to Sit, Sit to Supine     Supine to sit: Supervision Sit to supine: Supervision   General bed mobility comments: uses handrail, cues for  positioning due to increased pain today. Pt encouraged to position herself with inc pillows for trunk elevation or reclining chair.    Transfers Overall transfer level: Needs assistance Equipment used: Rollator (4 wheels) Transfers: Sit to/from Stand Sit to Stand: Modified independent (Device/Increase time)           General transfer comment: steady assist without AD    Ambulation/Gait Ambulation/Gait assistance: Supervision Gait Distance (Feet): 400 Feet Assistive device: Rollator (4 wheels) Gait Pattern/deviations: Step-through pattern, Wide base of support, Trunk flexed, Decreased stride length Gait velocity: dec     General Gait Details: Pt uses hemiwalker for mobility today, reciprocal pattern improves with practice, good trunk position   Stairs             Wheelchair Mobility     Tilt Bed    Modified Rankin (Stroke Patients Only)       Balance Overall balance assessment: Needs assistance Sitting-balance support: No upper extremity supported, Feet supported Sitting balance-Leahy Scale: Good     Standing balance support: No upper extremity supported, During functional activity Standing balance-Leahy Scale: Good                              Communication    Cognition Arousal: Alert Behavior During Therapy: WFL for tasks assessed/performed   PT - Cognitive impairments: No apparent impairments                                Cueing    Exercises      General Comments General comments (skin integrity, edema, etc.): nn  block wearing off, pain level increased today, was greater overnight. Pt reports 7-8/10 during the night. 2/10 with activity today.      Pertinent Vitals/Pain Pain Assessment Pain Assessment: 0-10 Pain Score: 2  Pain Intervention(s): Monitored during session, Ice applied, Repositioned    Home Living                          Prior Function            PT Goals (current goals can now be  found in the care plan section) Acute Rehab PT Goals Patient Stated Goal: return home and return to crafts classes Time For Goal Achievement: 11/14/23 Potential to Achieve Goals: Good Progress towards PT goals: Progressing toward goals    Frequency    Min 2X/week      PT Plan      Co-evaluation              AM-PAC PT "6 Clicks" Mobility   Outcome Measure  Help needed turning from your back to your side while in a flat bed without using bedrails?: A Little Help needed moving from lying on your back to sitting on the side of a flat bed without using bedrails?: A Little Help needed moving to and from a bed to a chair (including a wheelchair)?: None Help needed standing up from a chair using your arms (e.g., wheelchair or bedside chair)?: None Help needed to walk in hospital room?: A Little Help needed climbing 3-5 steps with a railing? : A Little 6 Click Score: 20    End of Session Equipment Utilized During Treatment: Gait belt Activity Tolerance: Patient tolerated treatment well Patient left: in chair;with call bell/phone within reach;with nursing/sitter in room Nurse Communication: Mobility status PT Visit Diagnosis: Other abnormalities of gait and mobility (R26.89);Muscle weakness (generalized) (M62.81);History of falling (Z91.81)     Time: 1610-9604 PT Time Calculation (min) (ACUTE ONLY): 48 min  Charges:    $Gait Training: 23-37 mins $Therapeutic Activity: 8-22 mins PT General Charges $$ ACUTE PT VISIT: 1 Visit                     Madaline Guthrie, PT Acute Rehabilitation Services Office: [phone] 11/01/2023\   Evelena Peat 11/01/2023, 10:18 AM

## 2023-11-01 NOTE — Discharge Summary (Addendum)
 Physician Discharge Summary   Patient: Jade Sanchez [facility] MRN: [id number] DOB: Aug 18, 1941  Admit date:     10/29/2023  Discharge date: 11/01/23  Discharge Physician: Meredeth Ide   PCP: Emilio Aspen, MD   Recommendations at discharge:   Follow-up PCP in 1 week Patient to go home with home health PT/OT  Discharge Diagnoses: Principal Problem:   Proximal humerus fracture Active Problems:   Fall   Thumb fracture   AKI (acute kidney injury) (HCC)   Essential hypertension   Primary hyperparathyroidism (HCC)   Hypercalcemia   Type 2 diabetes mellitus (HCC)   Hypercholesterolemia   Obesity (BMI 30-39.9)  Resolved Problems:   * No resolved hospital problems. *  Hospital Course: 83 year old female history of hypertension, hyperlipidemia, diabetes mellitus type 2, hyperparathyroidism, arthritis presented after having a fall.  Patient was attending a class at the senior center attempted to step over wheels of a large bulletin board of rollers foot got caught causing a fall.  Patient tried to catch herself by placing her hand on the board but rolled away did not hit her head during the fall but denied any significant pain in the head.  Patient with immediate pain and severe in the left shoulder since the incident and presented to the ED.  Patient seen in the ED plain films of the left shoulder revealed comminuted displaced proximal humerus fracture, x-rays of the left hand revealed irregularity of the thumb distal phalanx suspicious for nondisplaced fracture.  Orthopedics consulted and patient was  transferred to [facility] for total shoulder replacement on 10/30/2023.   Assessment and Plan:  Left proximal humerus and left arm fracture secondary to mechanical fall status post left reverse total shoulder arthroplasty, left open reduction total fixation of tuberosities -Secondary to mechanical fall sustained at senior center after tripping over a rolling bulletin  board injuring left shoulder and thumb. -X-rays of the left shoulder revealed a left comminuted displaced proximal humerus fracture and left hand revealed irregularity of the thumb distal phalanx suspicious for nondisplaced fracture. -Patient seen in consultation by orthopedics, Dr. Everardo Pacific who recommended transfer to [facility] for left total shoulder replacement, 10/30/2023. -Underwent surgery as above -, Orthopedics recommend Lovenox for DVT prophylaxis while inpatient and then discharged on aspirin 81 mg twice daily for 6 weeks -PT evaluation obtained; patient to go home with home health PT/OT   2.  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  Hypertension -BP currently stable. -Continue Lasix, irbesartan     4.  Primary hyperparathyroidism/hypercalcemia -Patient with history of hyperparathyroidism currently not on any medications. -Calcium on admission noted at 10.2 -Repeat calcium is 9.8 today.     5.-Hyperlipidemia  -Continue statin.   6.  Controlled type 2 diabetes mellitus, without long-term use of insulin -Hemoglobin A1c 7.0 -Diet controlled only, she will follow-up with PCP  -Does not want to start meds for diabetes ; discussed with patient   7.  Obesity -BMI 36.02 kg/m -Lifestyle modification -Outpatient follow-up with PCP.          Consultants: Orthopedics Procedures performed: None Disposition: Home Diet recommendation:  Discharge Diet Orders (From admission, onward)     Start     Ordered   11/01/23 0000  Diet - low sodium heart healthy        11/01/23 1045           Regular diet DISCHARGE MEDICATION: Allergies as of 11/01/2023   No Known Allergies  Medication List     STOP taking these medications    ALLEGRA PO       TAKE these medications    acetaminophen 500 MG tablet Commonly known as: TYLENOL Take 2 tablets (1,000 mg total) by mouth every 8 (eight) hours for 14 days. What changed:  how much to take when to  take this reasons to take this   aspirin 81 MG chewable tablet Commonly known as: Aspirin Childrens Chew 1 tablet (81 mg total) by mouth 2 (two) times daily. For 6 weeks for DVT prophylaxis after surgery   cetirizine 10 MG tablet Commonly known as: ZYRTEC Take 5 mg by mouth daily as needed for allergies.   Cholecalciferol 50 MCG (2000 UT) Caps Take 1 capsule by mouth daily.   docusate sodium 100 MG capsule Commonly known as: COLACE Take 1 capsule (100 mg total) by mouth 2 (two) times daily as needed for up to 10 days for mild constipation.   furosemide 40 MG tablet Commonly known as: LASIX Take 40 mg by mouth daily.   omeprazole 20 MG capsule Commonly known as: PRILOSEC Take 1 capsule (20 mg total) by mouth daily. To gastric protection while taking NSAIDs   ondansetron 4 MG tablet Commonly known as: Zofran Take 1 tablet (4 mg total) by mouth every 8 (eight) hours as needed for up to 7 days for nausea or vomiting.   oxyCODONE 5 MG immediate release tablet Commonly known as: Oxy IR/ROXICODONE Take 1-2 pills every 6 hrs as needed for severe pain, no more than 6 per day   potassium chloride 10 MEQ tablet Commonly known as: KLOR-CON Take 10 mEq by mouth daily.   rosuvastatin 5 MG tablet Commonly known as: CRESTOR Take 5 mg by mouth daily.   telmisartan 80 MG tablet Commonly known as: MICARDIS Take 80 mg by mouth daily.               Durable Medical Equipment  (From admission, onward)           Start     Ordered   10/31/23 1327  For home use only DME Walker hemi  Once       Question:  Patient needs a walker to treat with the following condition  Answer:  Shoulder pain   10/31/23 1326            Follow-up Information     Bjorn Pippin, MD. Call in 1 week(s).   Specialty: Orthopedic Surgery Why: For wound re-check Contact information: [address] [place] [address] [phone]         [facility] Follow up.   Why: [facility]  will follow up with you at discharge to provide home health services Contact information: [address] [place] [address] [phone]                Discharge Exam: Ceasar Mons Weights   10/29/23 1317  Weight: 104.3 kg   General-appears in no acute distress Heart-S1-S2, regular, no murmur auscultated Lungs-clear to auscultation bilaterally, no wheezing or crackles auscultated Abdomen-soft, nontender, no organomegaly Extremities-no edema in the lower extremities Neuro-alert, oriented x3, no focal deficit noted  Condition at discharge: good  The results of significant diagnostics from this hospitalization (including imaging, microbiology, ancillary and laboratory) are listed below for reference.   Imaging Studies: DG Shoulder Left Port Result Date: 10/30/2023 CLINICAL DATA:  Postop. EXAM: LEFT SHOULDER COMPARISON:  Preoperative imaging FINDINGS: Reverse left shoulder arthroplasty in expected alignment.  Fracture about the proximal humerus was present on preoperative exam. Recent postsurgical change includes air and edema in the joint space and soft tissues. IMPRESSION: Reverse left shoulder arthroplasty in expected alignment. Electronically Signed   By: Narda Rutherford M.D.   On: 10/30/2023 17:15   CT SHOULDER LEFT WO CONTRAST Result Date: 10/29/2023 CLINICAL DATA:  Trauma to the left shoulder.  Fall.  Pain. EXAM: CT OF THE UPPER LEFT EXTREMITY WITHOUT CONTRAST TECHNIQUE: Multidetector CT imaging of the upper left extremity was performed according to the standard protocol. RADIATION DOSE REDUCTION: This exam was performed according to the departmental dose-optimization program which includes automated exposure control, adjustment of the mA and/or kV according to patient size and/or use of iterative reconstruction technique. COMPARISON:  Left shoulder radiograph dated 10/29/2023. FINDINGS: Bones/Joint/Cartilage There is a comminuted fracture of the left humeral head and fracture of  the humeral neck. There is rotation and anterior displacement of the humeral shaft in relation to the humeral head. There is impaction of the humeral shaft on the anterior aspect of the humeral head fracture. The humeral head remains in anatomic alignment with the glenoid. There is minimal irregularity and spurring of the bony glenoid. No joint effusion. Ligaments Suboptimally assessed by CT. Muscles and Tendons No intramuscular fluid collection or hematoma. Soft tissues No acute findings. IMPRESSION: Comminuted, displaced, and impacted fracture of the left humeral head and neck. Electronically Signed   By: Elgie Collard M.D.   On: 10/29/2023 17:10   CT Head Wo Contrast Result Date: 10/29/2023 CLINICAL DATA:  Trauma, fall, landed on left shoulder. EXAM: CT HEAD WITHOUT CONTRAST CT CERVICAL SPINE WITHOUT CONTRAST TECHNIQUE: Multidetector CT imaging of the head and cervical spine was performed following the standard protocol without intravenous contrast. Multiplanar CT image reconstructions of the cervical spine were also generated. RADIATION DOSE REDUCTION: This exam was performed according to the departmental dose-optimization program which includes automated exposure control, adjustment of the mA and/or kV according to patient size and/or use of iterative reconstruction technique. COMPARISON:  None Available. FINDINGS: CT HEAD FINDINGS Brain: No acute intracranial hemorrhage. No CT evidence of acute infarct. No edema, mass effect, or midline shift. The basilar cisterns are patent. Ventricles: The ventricles are normal. Vascular: No hyperdense vessel or unexpected calcification. Skull: No acute or aggressive finding. Orbits: Orbits are symmetric. Sinuses: Mucosal thickening in the ethmoid sinuses and bilateral maxillary sinuses. Other: Mastoid air cells are clear. CT CERVICAL SPINE FINDINGS Alignment: Straightening of the normal cervical lordosis. Trace anterolisthesis of C4 on C5. No facet subluxation or  dislocation. Skull base and vertebrae: No compression fracture or displaced fracture in the cervical spine. No suspicious osseous lesion. Soft tissues and spinal canal: No prevertebral fluid or swelling. No visible canal hematoma. Disc levels: Intervertebral disc spaces are maintained. Degenerative endplate osteophytes at multiple levels. No evidence of high-grade spinal canal stenosis. Facet arthrosis at multiple levels throughout the cervical spine. Upper chest: Negative. Other: None. IMPRESSION: 1. No acute intracranial abnormality. 2. No acute fracture or traumatic malalignment of the cervical spine. 3. Multilevel degenerative changes in the cervical spine. Electronically Signed   By: Emily Filbert M.D.   On: 10/29/2023 15:48   CT Cervical Spine Wo Contrast Result Date: 10/29/2023 CLINICAL DATA:  Trauma, fall, landed on left shoulder. EXAM: CT HEAD WITHOUT CONTRAST CT CERVICAL SPINE WITHOUT CONTRAST TECHNIQUE: Multidetector CT imaging of the head and cervical spine was performed following the standard protocol without intravenous contrast. Multiplanar CT image reconstructions of the cervical spine  were also generated. RADIATION DOSE REDUCTION: This exam was performed according to the departmental dose-optimization program which includes automated exposure control, adjustment of the mA and/or kV according to patient size and/or use of iterative reconstruction technique. COMPARISON:  None Available. FINDINGS: CT HEAD FINDINGS Brain: No acute intracranial hemorrhage. No CT evidence of acute infarct. No edema, mass effect, or midline shift. The basilar cisterns are patent. Ventricles: The ventricles are normal. Vascular: No hyperdense vessel or unexpected calcification. Skull: No acute or aggressive finding. Orbits: Orbits are symmetric. Sinuses: Mucosal thickening in the ethmoid sinuses and bilateral maxillary sinuses. Other: Mastoid air cells are clear. CT CERVICAL SPINE FINDINGS Alignment: Straightening of the  normal cervical lordosis. Trace anterolisthesis of C4 on C5. No facet subluxation or dislocation. Skull base and vertebrae: No compression fracture or displaced fracture in the cervical spine. No suspicious osseous lesion. Soft tissues and spinal canal: No prevertebral fluid or swelling. No visible canal hematoma. Disc levels: Intervertebral disc spaces are maintained. Degenerative endplate osteophytes at multiple levels. No evidence of high-grade spinal canal stenosis. Facet arthrosis at multiple levels throughout the cervical spine. Upper chest: Negative. Other: None. IMPRESSION: 1. No acute intracranial abnormality. 2. No acute fracture or traumatic malalignment of the cervical spine. 3. Multilevel degenerative changes in the cervical spine. Electronically Signed   By: Emily Filbert M.D.   On: 10/29/2023 15:48   DG Hand Complete Left Result Date: 10/29/2023 CLINICAL DATA:  Pain after fall. EXAM: LEFT HAND - COMPLETE 3+ VIEW COMPARISON:  None Available. FINDINGS: Irregularity of the thumb distal phalanx is suspicious for nondisplaced fracture. This extends to the interphalangeal joint articular surface. No other fracture of the hand. Osteoarthritis involving the digits and thumb carpal metacarpal joint. The bones are diffusely under mineralized. No definite focal soft tissue abnormalities. IMPRESSION: 1. Irregularity of the thumb distal phalanx is suspicious for nondisplaced fracture. This extends to the interphalangeal joint articular surface. 2. Osteoarthritis. Electronically Signed   By: Narda Rutherford M.D.   On: 10/29/2023 15:34   DG Shoulder Left Result Date: 10/29/2023 CLINICAL DATA:  Lost balance leading to fall. Unable to move left shoulder. EXAM: LEFT SHOULDER - 2+ VIEW; LEFT HUMERUS - 2+ VIEW COMPARISON:  None Available. FINDINGS: Shoulder: Comminuted displaced proximal humerus fracture. The humeral head likely remains congruent with the glenoid, however there is a significant rotational  component. Suspect apex lateral angulation. There is a displaced fragment involving the lateral humeral head. The humeral shaft appears to be displaced anteriorly. CT is planned for further assessment. The acromioclavicular joint is congruent. Humerus: The distal humerus is intact. Elbow alignment is poorly assessed on provided views. IMPRESSION: 1. Comminuted displaced proximal humerus fracture. The humeral head likely remains congruent with the glenoid, however there is a significant rotational component in the humeral shaft appears to be displaced anteriorly. 2. No distal humerus fracture. Electronically Signed   By: Narda Rutherford M.D.   On: 10/29/2023 15:32   DG Humerus Left Result Date: 10/29/2023 CLINICAL DATA:  Lost balance leading to fall. Unable to move left shoulder. EXAM: LEFT SHOULDER - 2+ VIEW; LEFT HUMERUS - 2+ VIEW COMPARISON:  None Available. FINDINGS: Shoulder: Comminuted displaced proximal humerus fracture. The humeral head likely remains congruent with the glenoid, however there is a significant rotational component. Suspect apex lateral angulation. There is a displaced fragment involving the lateral humeral head. The humeral shaft appears to be displaced anteriorly. CT is planned for further assessment. The acromioclavicular joint is congruent. Humerus: The distal humerus is  intact. Elbow alignment is poorly assessed on provided views. IMPRESSION: 1. Comminuted displaced proximal humerus fracture. The humeral head likely remains congruent with the glenoid, however there is a significant rotational component in the humeral shaft appears to be displaced anteriorly. 2. No distal humerus fracture. Electronically Signed   By: Narda Rutherford M.D.   On: 10/29/2023 15:32   MR PELVIS W WO CONTRAST Result Date: 10/21/2023 CLINICAL DATA:  Follow-up adnexal masses. EXAM: MRI PELVIS WITHOUT AND WITH CONTRAST TECHNIQUE: Multiplanar multisequence MR imaging of the pelvis was performed both before and  after administration of intravenous contrast. CONTRAST:  10mL GADAVIST GADOBUTROL 1 MMOL/ML IV SOLN COMPARISON:  Numerous prior imaging studies. The most recent MRI is 03/29/2023 FINDINGS: Urinary Tract: The bladder is largely decompressed. No bladder lesions or bladder calculi. Bowel: The rectum, sigmoid colon and visualized small-bowel loops are grossly normal. There is stable severe sigmoid colon diverticulosis. Vascular/Lymphatic: The major vascular structures are patent. Stable small bilateral lateral external iliac lymph nodes and left common femoral node. No pelvic sidewall adenopathy. No lower retroperitoneal adenopathy. Reproductive: The uterus is unremarkable and stable. The endometrium measures a maximum 4.5 mm. Small nabothian cysts are noted at the cervix. Unchanged cystic lesion in the right adnexa closely associated with the right ovary. This is likely 2 separate cysts as opposed to a single septated cyst. These are stable in size since the recent pelvic MR examination and do not demonstrate any contrast enhancement. No nodularity or any other worrisome MR imaging features. Stable large largely T2 hypointense but complex solid left adnexal mass likely arising from the left ovary and occupying the cul-de-sac region. This measures a maximum of 8.8 x 7.8 x 8.0 cm. No appreciable or worrisome contrast enhancement. 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 Other: No free pelvic fluid collections. No inguinal mass or hernia. No subcutaneous lesions. Musculoskeletal: No significant bony findings. Stable degenerative anterolisthesis of L4 and L5. IMPRESSION: 1. Stable large 8.8 x 7.8 x 8.0 cm complex solid left adnexal mass likely arising from the left ovary and occupying the cul-de-sac region. 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 2. Stable cystic lesion in the right adnexa closely associated with the right ovary. This is likely 2 separate cysts as opposed to a single  septated cyst. These are stable in size since the recent pelvic MR examination and do not demonstrate any contrast enhancement. No nodularity or any other worrisome MR imaging features. 3. Stable small bilateral lateral external iliac lymph nodes and left common femoral node. No pelvic sidewall adenopathy. 4. Stable severe sigmoid colon diverticulosis. Electronically Signed   By: Rudie Meyer M.D.   On: 10/21/2023 18:36    Microbiology: Results for orders placed or performed during the hospital encounter of 10/29/23  Surgical pcr screen     Status: Abnormal   Collection Time: 10/30/23 11:24 AM   Specimen: Nasal Mucosa; Nasal Swab  Result Value Ref Range Status   MRSA, PCR NEGATIVE NEGATIVE Final   Staphylococcus aureus POSITIVE (A) NEGATIVE Final    Comment: (NOTE) The Xpert SA Assay (FDA approved for NASAL specimens in patients 61 years of age and older), is one component of a comprehensive surveillance program. It is not intended to diagnose infection nor to guide or monitor treatment. Performed at [facility], [address], [place], [place] [address]     Labs: CBC: Recent Labs  Lab 10/29/23 1329 10/29/23 1336 10/30/23 0527 10/30/23 1718 10/31/23 0633  WBC  9.1  --  11.2* 13.0* 11.2*  NEUTROABS 5.5  --   --   --  8.9*  HGB 14.5 13.9 13.4 12.0 10.9*  HCT 42.5 41.0 40.1 38.3 34.8*  MCV 89.9  --  91.6 96.0 96.9  PLT 297  --  262 232 215   Basic Metabolic Panel: Recent Labs  Lab 10/29/23 1329 10/29/23 1336 10/30/23 0527 10/30/23 1718 10/31/23 0633  NA 135 136 134*  --  137  K 4.0 4.1 4.2  --  4.6  CL 106 108 106  --  107  CO2 20*  --  24  --  24  GLUCOSE 150* 151* 146*  --  166*  BUN 16 18 15   --  13  CREATININE 1.18* 1.20* 1.23* 0.92 0.84  CALCIUM 10.7*  --  10.2  --  9.8  PHOS  --   --   --   --  2.9   Liver Function Tests: Recent Labs  Lab 10/31/23 0633  ALBUMIN 3.3*   CBG: Recent Labs  Lab 10/30/23 1730 10/30/23 2125 10/31/23 0003  10/31/23 0618 10/31/23 1252  GLUCAP 151* 210* 179* 148* 198*    Discharge time spent: greater than 30 minutes.  Signed: Meredeth Ide, MD [facility] 11/01/2023

## 2023-11-01 NOTE — Evaluation (Signed)
 Occupational Therapy Evaluation Patient Details Name: Jade Sanchez [facility] MRN: [id number] DOB: 01/07/1941 Today's Date: 11/01/2023   History of Present Illness   Pt is a 83 y.o. female admitted 10/29/23 for dx L humeral fx with operative TSA 10/30/23 with medical history significant of hypertension, hyperlipidemia, diabetes mellitus type 2, hyperparathyroidism, and arthritis who presents after having a fall.     Clinical Impressions PTA pt lives alone independently. Daughter Cordelia Pen to provide S/assist upon d/c.   Education completed regarding compensatory strategies for ADL tasks and functional mobility, management of sling, L ROM per specified parameters in the order set as indicated below, positioning of operative arm in sitting and supine and edema control, including use of "Iceman" Cold Therapy machine. Caregiver present for education, written handouts provided and reviewed using Teach Back and pt/caregiver verbalized/demonstrated understanding. Due to the below listed deficits, pt requires mod assistance with ADL tasks and CGA assist with functional mobility. Patient was issued a L thumb spica splint at [facility] and dtr has it at home and will apply as soon as patient gets home. Caregiver will be able to provide necessary level of assistance at discharge. OT signing off for Acute needs but recommending HH OT to follow up with L shoulder protocol and ADL compensatory training and balance with focus on falls prevention. Pt to follow up with MD to progress rehab of the operative shoulder. No further Acute OT needs.       If plan is discharge home, recommend the following:   A lot of help with bathing/dressing/bathroom;Assistance with cooking/housework;Assist for transportation;Help with stairs or ramp for entrance;Direct supervision/assist for medications management;A little help with walking and/or transfers     Functional Status Assessment   Patient has had a recent decline in their  functional status and demonstrates the ability to make significant improvements in function in a reasonable and predictable amount of time.     Equipment Recommendations    (hemi walker and bed rail dtr ordered)      Precautions/Restrictions   Precautions Precautions: Fall Recall of Precautions/Restrictions: Intact Precaution/Restrictions Comments: PWB LUE Required Braces or Orthoses: Sling Restrictions Weight Bearing Restrictions Per Provider Order: Yes LUE Weight Bearing Per Provider Order: Partial weight bearing Other Position/Activity Restrictions: PWB at waist level No shoulder ROM allowed; elbow/wrist/hand AROM only.     Mobility Bed Mobility Overal bed mobility: Needs Assistance Bed Mobility: Supine to Sit, Sit to Supine     Supine to sit: Supervision Sit to supine: Supervision   General bed mobility comments: uses handrail, cues for positioning due to increased pain today. Pt encouraged to position herself with inc pillows for trunk elevation or reclining chair.    Transfers Overall transfer level: Needs assistance Equipment used: Hemi-walker Transfers: Sit to/from Stand Sit to Stand: Modified independent (Device/Increase time)           General transfer comment: steady assist without AD      Balance Overall balance assessment: Needs assistance Sitting-balance support: No upper extremity supported, Feet supported Sitting balance-Leahy Scale: Good     Standing balance support: No upper extremity supported, During functional activity Standing balance-Leahy Scale: Good                             ADL either performed or assessed with clinical judgement   ADL Overall ADL's : Needs assistance/impaired    Per orders, L shoulder parameters as follows for ADL tasks: No shoulder ROM; elbow/wrist/hand ROM only.  While moving within specified parameters, pt/caregiver instructed on bathing and how to donn/doff shirt, placing operative arm through  sleeve first when donning and off last when doffing.Pt/caregiver educated on compensatory strategies for LB ADL and strategies to reduce risk of falls.  Pt/caregiver educated on donning/doffing sling and to wear the sling at all times with the exception of ADL, and to loosen the neck strap of the sling when the operative arm is in a supported position when sitting. In sitting or supine, pt instructed to have a pillow behind and under their operative arm to provide support. Also, educated on application of L thumb spica splint which daughter by mistake brought home but will apply as soon as home. Caregiver educated on the importance of walking on pt's non-operative side with hemi walker and gait belt for safety. Education regarding use of "IceMan" Cold Therapy completed, including the importance of using a barrier on the shoulder prior to positioning the wrap-on pad. Pt/caregiver verbalized/demonstrated understanding. Teach Back used while caregiver assisted with dressing pt and positioning "wrap-on pad" to facilitate DC.                                           Vision Baseline Vision/History: 0 No visual deficits Ability to See in Adequate Light: 0 Adequate Patient Visual Report: No change from baseline Vision Assessment?: No apparent visual deficits     Perception Perception: Within Functional Limits       Praxis Praxis: WFL       Pertinent Vitals/Pain Pain Assessment Pain Assessment: 0-10 Pain Score: 4  Breathing: normal Negative Vocalization: none Pain Location: L UE Pain Descriptors / Indicators: Aching, Sore, Sharp Pain Intervention(s): Repositioned, Monitored during session, Limited activity within patient's tolerance, RN gave pain meds during session, Relaxation, Ice applied, Utilized relaxation techniques     Extremity/Trunk Assessment Upper Extremity Assessment Upper Extremity Assessment: Right hand dominant;LUE deficits/detail;Difficult to assess due to  impaired cognition LUE Deficits / Details: see post-op instructions and need for L thumb spica splint LUE Coordination: decreased fine motor;decreased gross motor   Lower Extremity Assessment Lower Extremity Assessment: Generalized weakness   Cervical / Trunk Assessment Cervical / Trunk Assessment:  (forward head)   Communication Communication Communication: No apparent difficulties   Cognition Arousal: Alert Behavior During Therapy: WFL for tasks assessed/performed                                 Following commands: Intact       Cueing  General Comments   Cueing Techniques: Verbal cues  L shoulder post op dressing intact, edema +2 L UE and bruising L thumb   Exercises Exercises: Other exercises (see outlined protocol)   Shoulder Instructions  See clinical impressions- pt and dtr trained and issued handout for protocol     Home Living Family/patient expects to be discharged to:: Private residence Living Arrangements: Alone Available Help at Discharge: Family;Available PRN/intermittently Type of Home: House Home Access: Stairs to enter [employer] of Steps: 1 (x2) Entrance Stairs-Rails: None Home Layout: One level     Bathroom Shower/Tub: Producer, television/film/video: Standard Bathroom Accessibility: Yes   Home Equipment: Rollator (4 wheels);Grab bars - toilet          Prior Functioning/Environment Prior Level of Function : Independent/Modified Independent;Driving;History of Falls (last six  months)             Mobility Comments: rollator for community activities ADLs Comments: takes several classes at senior center, enjoys crafts    OT Problem List: Impaired UE functional use;Pain;Impaired balance (sitting and/or standing);Decreased activity tolerance        OT Goals(Current goals can be found in the care plan section)   Acute Rehab OT Goals Patient Stated Goal: to go home today with dtr OT Goal Formulation: With  patient/family Time For Goal Achievement: 11/01/23 Potential to Achieve Goals: Good   AM-PAC OT "6 Clicks" Daily Activity     Outcome Measure Help from another person eating meals?: A Little Help from another person taking care of personal grooming?: A Little Help from another person toileting, which includes using toliet, bedpan, or urinal?: A Little Help from another person bathing (including washing, rinsing, drying)?: A Lot Help from another person to put on and taking off regular upper body clothing?: A Lot Help from another person to put on and taking off regular lower body clothing?: A Lot 6 Click Score: 15   End of Session Equipment Utilized During Treatment: Gait belt (hemi walker) Nurse Communication: Mobility status;Precautions;Weight bearing status  Activity Tolerance: No increased pain Patient left: in chair;with chair alarm set;with family/visitor present;with call bell/phone within reach  OT Visit Diagnosis: Unsteadiness on feet (R26.81);Pain Pain - Right/Left: Left Pain - part of body: Arm                Time: 2956-2130 OT Time Calculation (min): 75 min Charges:  OT General Charges $OT Visit: 1 Visit OT Evaluation $OT Eval Moderate Complexity: 1 Mod OT Treatments $Self Care/Home Management : 53-67 mins  Itzia Cunliffe OT/L Acute Rehabilitation Department  [phone]  11/01/2023, 12:47 PM

## 2023-11-01 NOTE — TOC Transition Note (Addendum)
 Transition of Care [facility]) - Discharge Note   Patient Details  Name: Jade Sanchez MRN: [id number] Date of Birth: [birth year]  [facility] CM/SW Contact:  Otelia Santee, LCSW Phone Number: 11/01/2023, 10:26 AM   Clinical Narrative:    Per [employer] Pt received RW in 2022. Insurance will not cover another assistive device for another 2 years. Hemi walker through [employer] is $60. Met with pt to discuss. Pt shares she will try to use her RW and will privately pay for hemi walker from a supply store once discharged if decides she needs it.  CSW rediscussed [facility] services. PT now agreeable to HHOT. Spoke with [facility] and confirmed OT can be added to [facility] services. HHPT/OT arranged with [facility]. HH order in place.    Final next level of care: Home w Home Health Services Barriers to Discharge: No Barriers Identified   Patient Goals and CMS Choice Patient states their goals for this hospitalization and ongoing recovery are:: To return home CMS Medicare.gov Compare Post Acute Care list provided to:: Patient Choice offered to / list presented to : Patient [place] ownership interest in [facility].provided to::  (NA)    Discharge Placement                       Discharge Plan and Services Additional resources added to the After Visit Summary for   In-house Referral: Clinical Social Work Discharge Planning Services: NA Post Acute Care Choice: Home Health, Durable Medical Equipment          DME Arranged: N/A DME Agency: NA       HH Arranged: PT HH Agency: [facility] Date HH Agency Contacted: 10/31/23 Time HH Agency Contacted: 1328 Representative spoke with at [facility] Agency: Elnita Maxwell  Social Drivers of Health (SDOH) Interventions SDOH Screenings   Food Insecurity: No Food Insecurity (10/29/2023)  Housing: Low Risk  (10/29/2023)  Transportation Needs: No Transportation Needs (10/29/2023)  Utilities: Not At Risk (10/29/2023)  Depression  (PHQ2-9): Low Risk  (06/28/2022)  Social Connections: Moderately Integrated (10/29/2023)  Tobacco Use: Medium Risk (10/30/2023)     Readmission Risk Interventions    07/26/2021   12:44 PM  Readmission Risk Prevention Plan  Post Dischage Appt Complete  Medication Screening Complete  Transportation Screening Complete

## 2023-11-04 ENCOUNTER — Encounter (HOSPITAL_COMMUNITY): Payer: Self-pay | Admitting: Orthopaedic Surgery

## 2024-04-30 ENCOUNTER — Other Ambulatory Visit: Payer: Self-pay | Admitting: Internal Medicine

## 2024-04-30 DIAGNOSIS — Z1231 Encounter for screening mammogram for malignant neoplasm of breast: Secondary | ICD-10-CM

## 2024-05-15 ENCOUNTER — Ambulatory Visit
Admission: RE | Admit: 2024-05-15 | Discharge: 2024-05-15 | Disposition: A | Discharge status: Home / Self Care | Source: Ambulatory Visit | Attending: Internal Medicine | Admitting: Internal Medicine

## 2024-05-15 DIAGNOSIS — Z1231 Encounter for screening mammogram for malignant neoplasm of breast: Secondary | ICD-10-CM

## 2024-05-19 ENCOUNTER — Encounter (INDEPENDENT_AMBULATORY_CARE_PROVIDER_SITE_OTHER): Payer: Medicare Other | Admitting: Ophthalmology

## 2024-05-19 DIAGNOSIS — H35033 Hypertensive retinopathy, bilateral: Secondary | ICD-10-CM

## 2024-05-19 DIAGNOSIS — D3131 Benign neoplasm of right choroid: Secondary | ICD-10-CM | POA: Diagnosis not present

## 2024-05-19 DIAGNOSIS — I10 Essential (primary) hypertension: Secondary | ICD-10-CM

## 2024-05-19 DIAGNOSIS — E113293 Type 2 diabetes mellitus with mild nonproliferative diabetic retinopathy without macular edema, bilateral: Secondary | ICD-10-CM

## 2024-05-19 DIAGNOSIS — H35372 Puckering of macula, left eye: Secondary | ICD-10-CM | POA: Diagnosis not present

## 2024-05-19 DIAGNOSIS — H43813 Vitreous degeneration, bilateral: Secondary | ICD-10-CM

## 2024-05-19 DIAGNOSIS — Z7984 Long term (current) use of oral hypoglycemic drugs: Secondary | ICD-10-CM

## 2024-08-31 ENCOUNTER — Encounter: Payer: Self-pay | Admitting: Family Medicine

## 2024-09-01 ENCOUNTER — Other Ambulatory Visit: Payer: Self-pay | Admitting: *Deleted

## 2024-09-01 DIAGNOSIS — N83201 Unspecified ovarian cyst, right side: Secondary | ICD-10-CM

## 2024-10-07 ENCOUNTER — Ambulatory Visit (HOSPITAL_COMMUNITY)

## 2025-05-19 ENCOUNTER — Encounter (INDEPENDENT_AMBULATORY_CARE_PROVIDER_SITE_OTHER): Admitting: Ophthalmology
